# Patient Record
Sex: Male | Born: 1950 | Race: White | Hispanic: No | Marital: Married | Smoking: Never smoker
Health system: Southern US, Community
[De-identification: ages and names within clinical notes are randomized; demographics above are authoritative.]

## PROBLEM LIST (undated history)

## (undated) DIAGNOSIS — R519 Headache, unspecified: Secondary | ICD-10-CM

## (undated) DIAGNOSIS — R0989 Other specified symptoms and signs involving the circulatory and respiratory systems: Secondary | ICD-10-CM

## (undated) DIAGNOSIS — N183 Chronic kidney disease, stage 3 unspecified: Secondary | ICD-10-CM

## (undated) DIAGNOSIS — R011 Cardiac murmur, unspecified: Secondary | ICD-10-CM

## (undated) DIAGNOSIS — I10 Essential (primary) hypertension: Secondary | ICD-10-CM

## (undated) DIAGNOSIS — E7800 Pure hypercholesterolemia, unspecified: Secondary | ICD-10-CM

## (undated) DIAGNOSIS — Z87442 Personal history of urinary calculi: Secondary | ICD-10-CM

## (undated) DIAGNOSIS — T7840XA Allergy, unspecified, initial encounter: Secondary | ICD-10-CM

## (undated) DIAGNOSIS — M1990 Unspecified osteoarthritis, unspecified site: Secondary | ICD-10-CM

## (undated) DIAGNOSIS — E1122 Type 2 diabetes mellitus with diabetic chronic kidney disease: Secondary | ICD-10-CM

## (undated) DIAGNOSIS — K219 Gastro-esophageal reflux disease without esophagitis: Secondary | ICD-10-CM

## (undated) DIAGNOSIS — I350 Nonrheumatic aortic (valve) stenosis: Secondary | ICD-10-CM

## (undated) DIAGNOSIS — I480 Paroxysmal atrial fibrillation: Secondary | ICD-10-CM

## (undated) DIAGNOSIS — I2510 Atherosclerotic heart disease of native coronary artery without angina pectoris: Secondary | ICD-10-CM

## (undated) DIAGNOSIS — G4730 Sleep apnea, unspecified: Secondary | ICD-10-CM

## (undated) DIAGNOSIS — E785 Hyperlipidemia, unspecified: Secondary | ICD-10-CM

## (undated) DIAGNOSIS — I499 Cardiac arrhythmia, unspecified: Secondary | ICD-10-CM

## (undated) HISTORY — DX: Chronic kidney disease, stage 3 (moderate): N18.3

## (undated) HISTORY — DX: Chronic kidney disease, stage 3 unspecified: N18.30

## (undated) HISTORY — DX: Other specified symptoms and signs involving the circulatory and respiratory systems: R09.89

## (undated) HISTORY — DX: Type 2 diabetes mellitus with diabetic chronic kidney disease: E11.22

---

## 1997-12-11 HISTORY — PX: HERNIA REPAIR: SHX51

## 1999-12-12 HISTORY — PX: OTHER SURGICAL HISTORY: SHX169

## 2001-10-04 ENCOUNTER — Encounter: Payer: Self-pay | Admitting: Family Medicine

## 2001-10-04 ENCOUNTER — Encounter: Admission: RE | Admit: 2001-10-04 | Discharge: 2001-10-04 | Payer: Self-pay | Admitting: Family Medicine

## 2001-11-27 ENCOUNTER — Encounter: Payer: Self-pay | Admitting: Neurosurgery

## 2001-11-28 ENCOUNTER — Encounter: Payer: Self-pay | Admitting: Neurosurgery

## 2001-11-28 ENCOUNTER — Inpatient Hospital Stay (HOSPITAL_COMMUNITY): Admission: RE | Admit: 2001-11-28 | Discharge: 2001-11-29 | Payer: Self-pay | Admitting: Neurosurgery

## 2001-12-23 ENCOUNTER — Encounter: Admission: RE | Admit: 2001-12-23 | Discharge: 2001-12-23 | Payer: Self-pay | Admitting: Neurosurgery

## 2001-12-23 ENCOUNTER — Encounter: Payer: Self-pay | Admitting: Neurosurgery

## 2004-12-16 ENCOUNTER — Emergency Department: Payer: Self-pay | Admitting: Emergency Medicine

## 2006-05-24 ENCOUNTER — Inpatient Hospital Stay: Payer: Self-pay | Admitting: Internal Medicine

## 2006-05-24 IMAGING — CR DG ABDOMEN 2V
1 series · 4 of 4 positions shown · non-contrast
Comparison: none

REASON FOR EXAM: abdominal pain
COMMENTS:

[Series 1: view not recorded · 0.17mm/px · 4 of 4 slices shown]
[im 1/4]
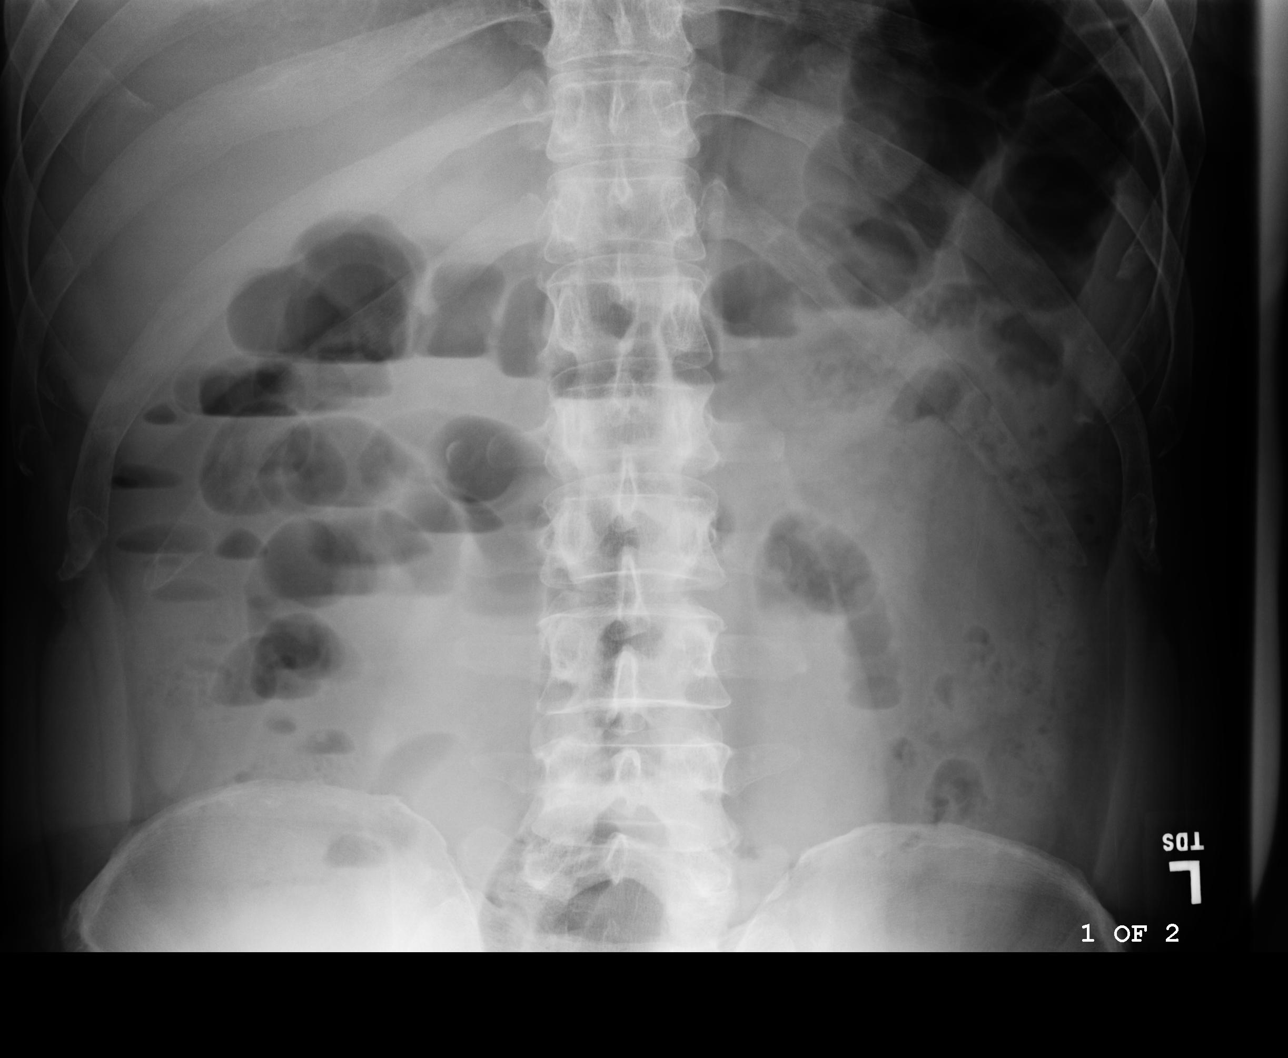
[im 2/4]
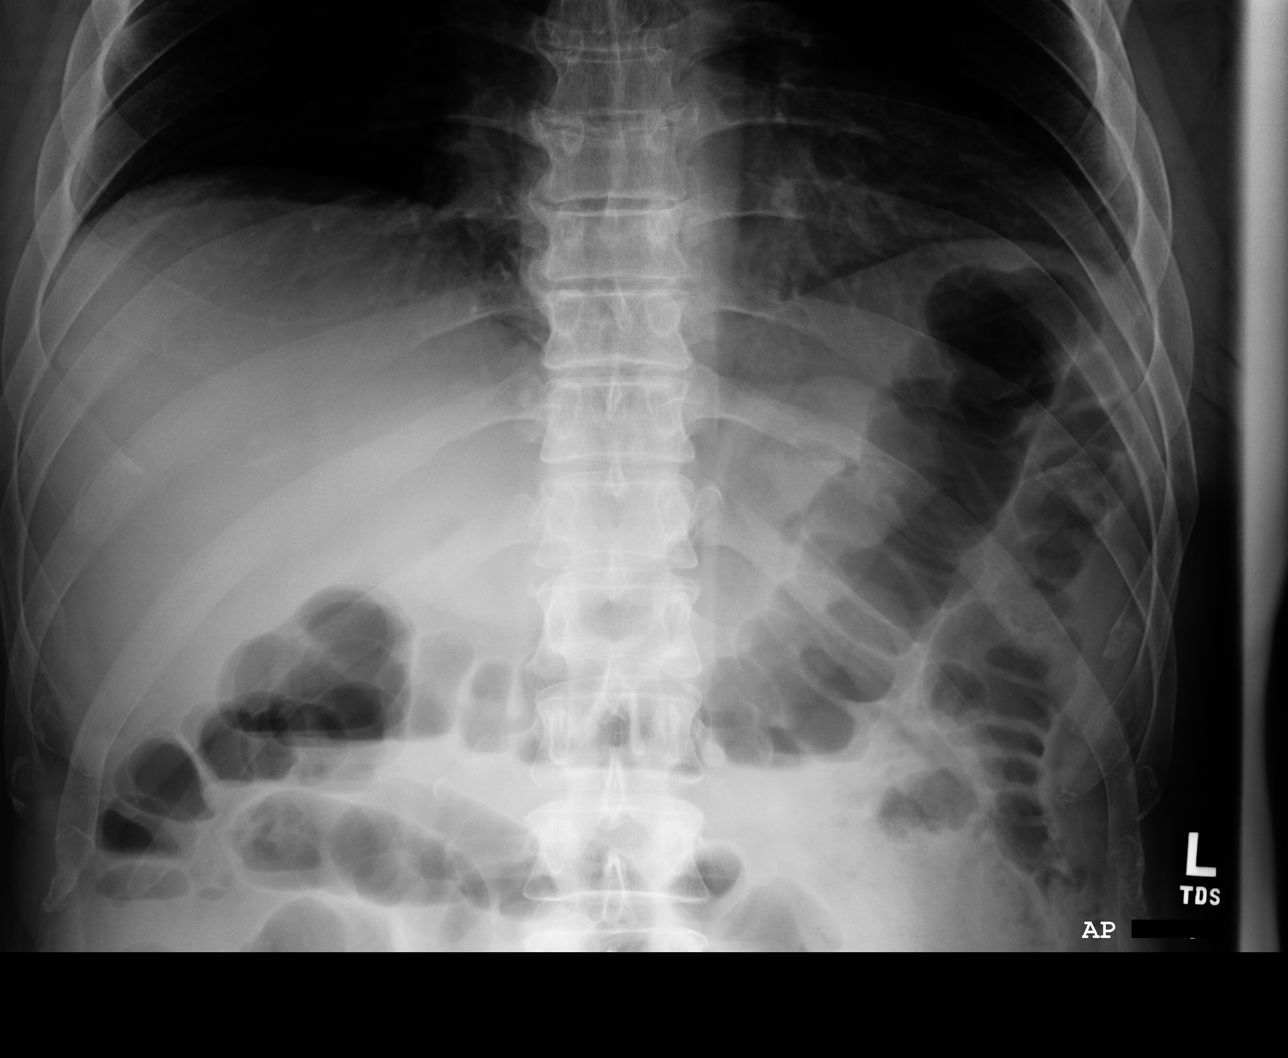
[im 3/4]
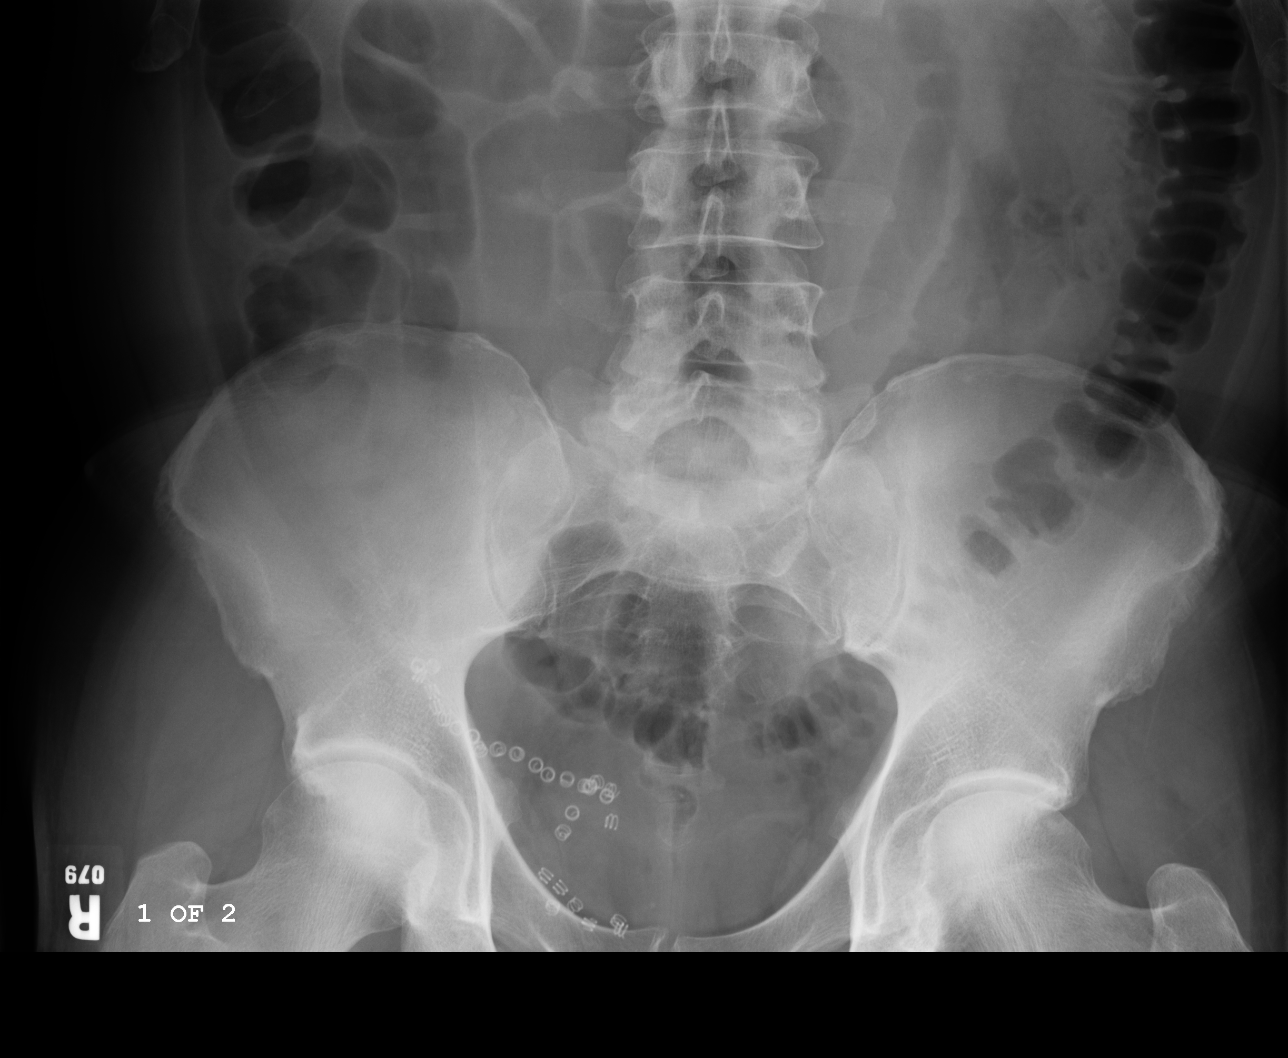
[im 4/4]
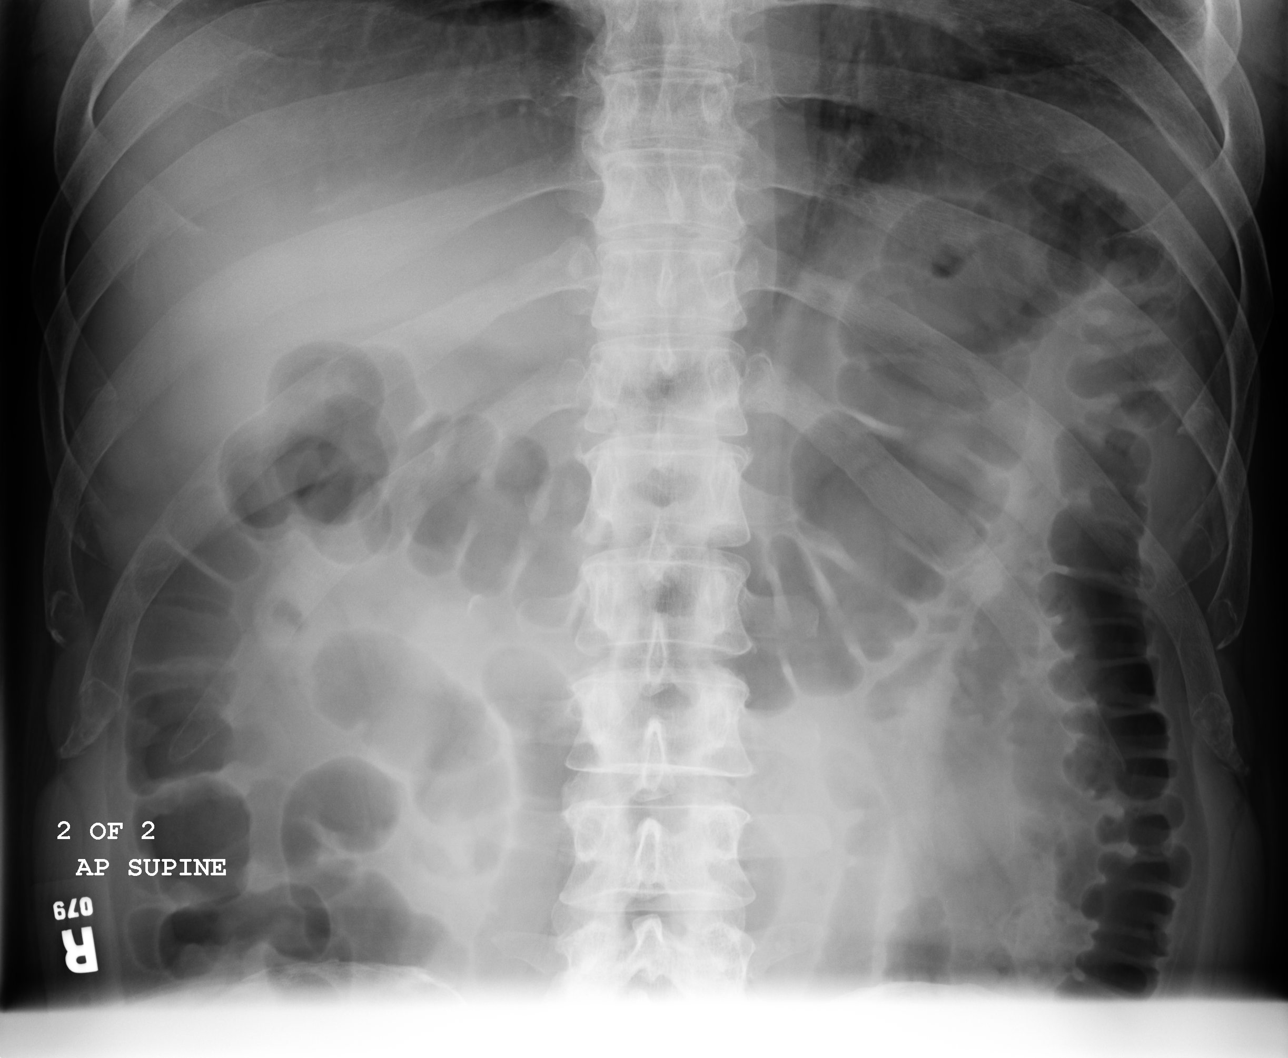

[4 of 4 positions shown; findings below may reference images not displayed]

PROCEDURE:     DXR - DXR ABDOMEN 2 V FLAT AND ERECT  - [DATE]  [DATE]

RESULT:          Flat and erect views of the abdomen were obtained.

No subdiaphragmatic free air is seen.  In the erect view, there are
scattered, nonspecific fluid levels in multiple loops of small bowel.  Such
fluid levels are nonspecific and are of the type frequently seen with
gastroenteritis, enteritis, or intraabdominal inflammation.  Gas is
visualized in the colon.  No abnormal intraabdominal calcifications are
seen.  The osseous structures are normal in appearance.
IMPRESSION: 1.     There are scattered, nonspecific fluid levels in loops of small bowel
as noted above.
2.     No bowel obstruction is seen.

## 2006-05-27 IMAGING — CT CT ABD-PELV W/ CM
1 of 3 series · 14 of 32 positions shown, 19 images · non-contrast
Comparison: none

REASON FOR EXAM: abdominal pain, diarrhea
COMMENTS:

PROCEDURE:     CT  - CT ABDOMEN / PELVIS  W  - [DATE]  [DATE]
RESULT:          Reason for consultation is abdominal pain.
TECHNIQUE: Axial images were obtained from the hemidiaphragm to the
pubic symphysis post intravenous and oral administration of contrast
material.

[Series 2: abdomen · axial · 0.77mm/px · z∈[-507,-83]mm · 14 of 62 slices shown, 19 images]
[im 4/62  soft-tissue]
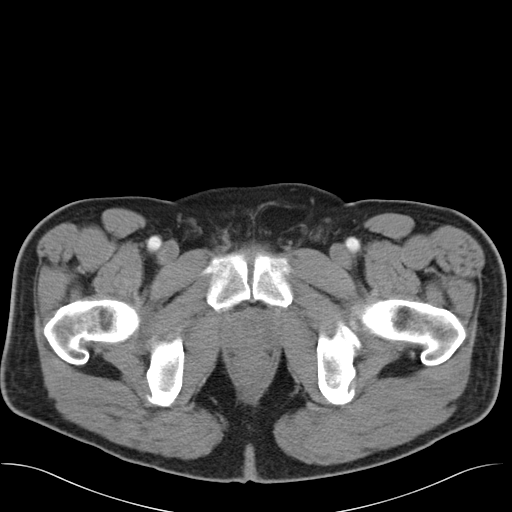
[im 4/62  bone]
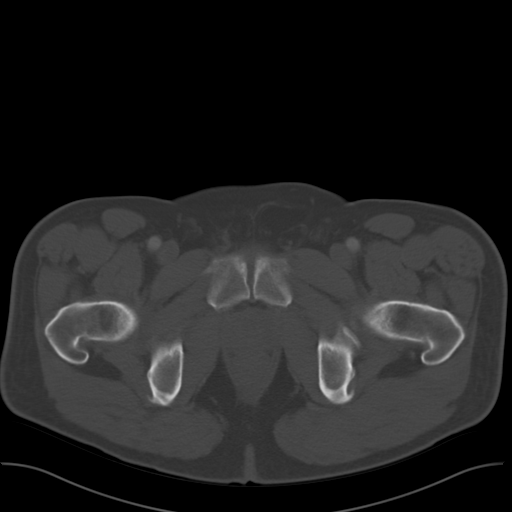
[im 10/62  soft-tissue]
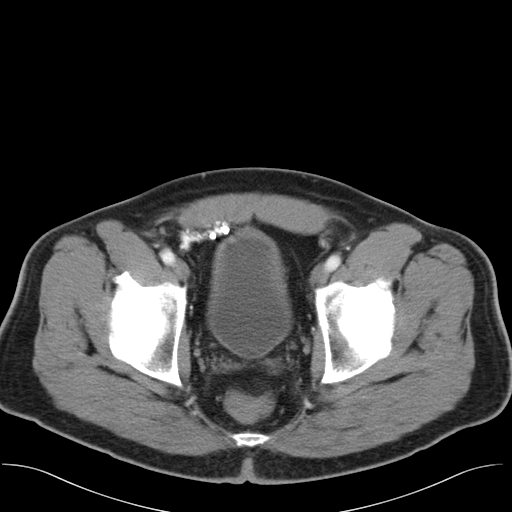
[im 13/62  soft-tissue]
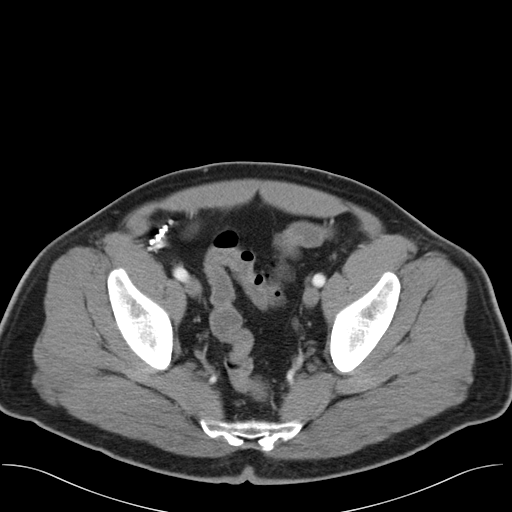
[im 17/62  soft-tissue]
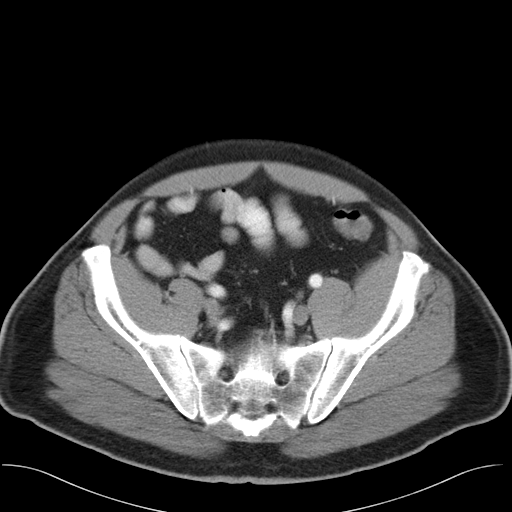
[im 23/62  soft-tissue]
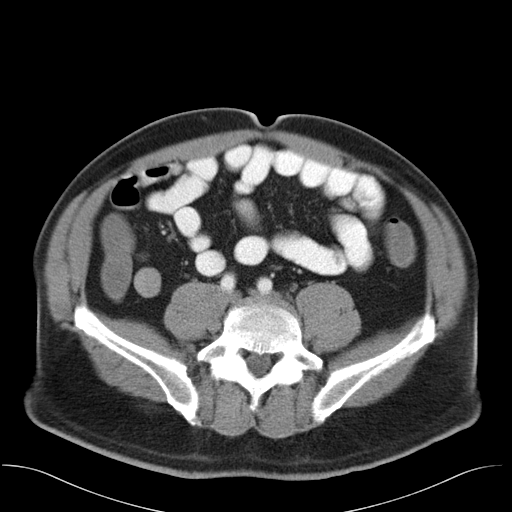
[im 26/62  soft-tissue]
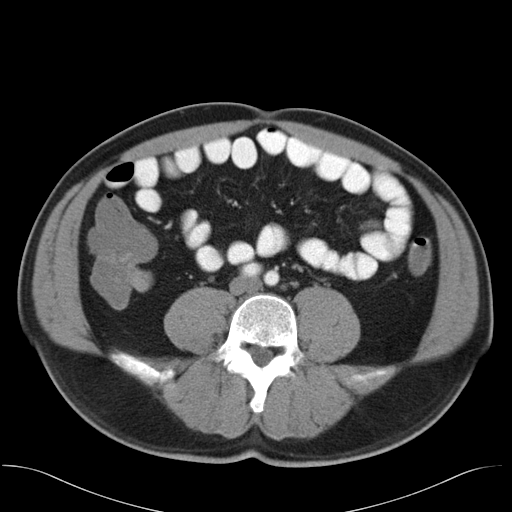
[im 33/62  soft-tissue]
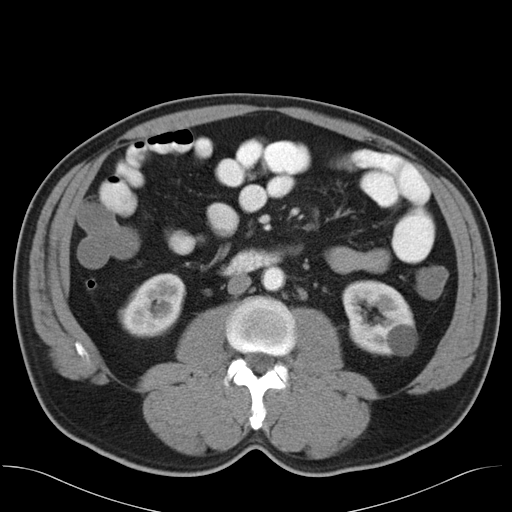
[im 36/62  soft-tissue]
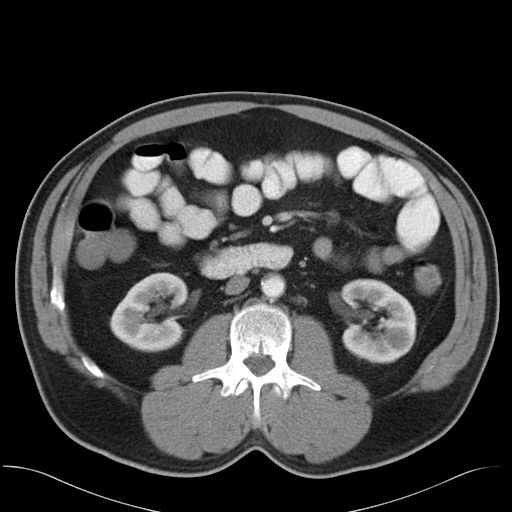
[im 39/62  soft-tissue]
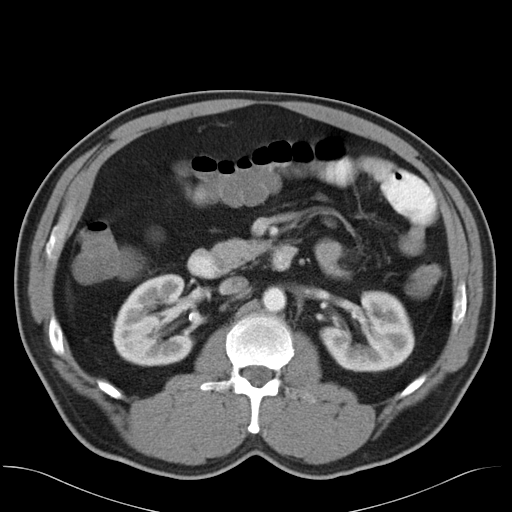
[im 39/62  bone]
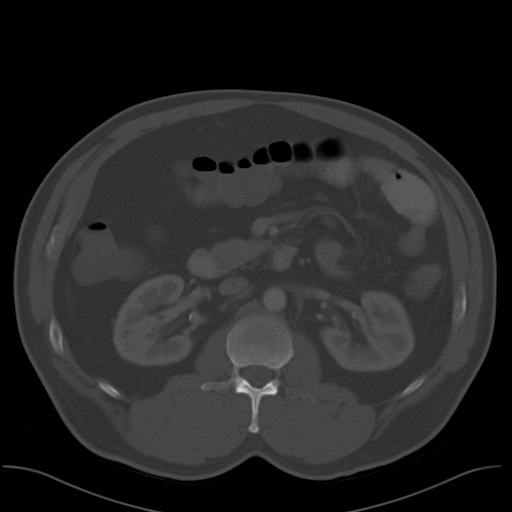
[im 45/62  soft-tissue]
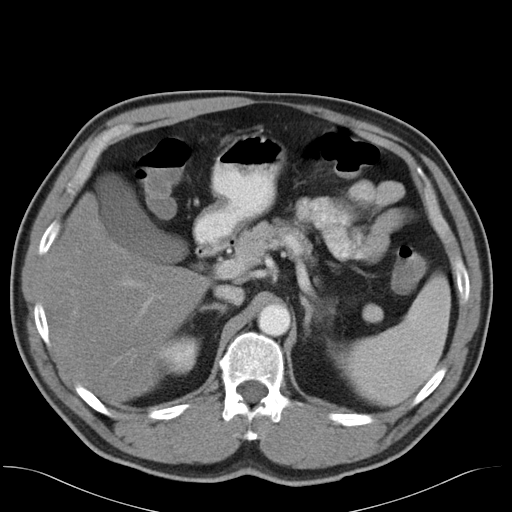
[im 49/62  soft-tissue]
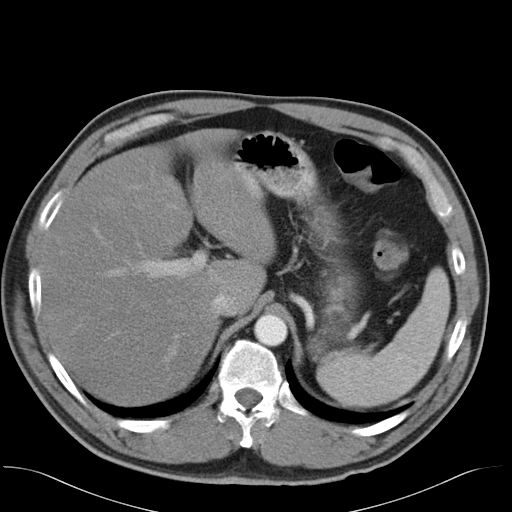
[im 49/62  lung]
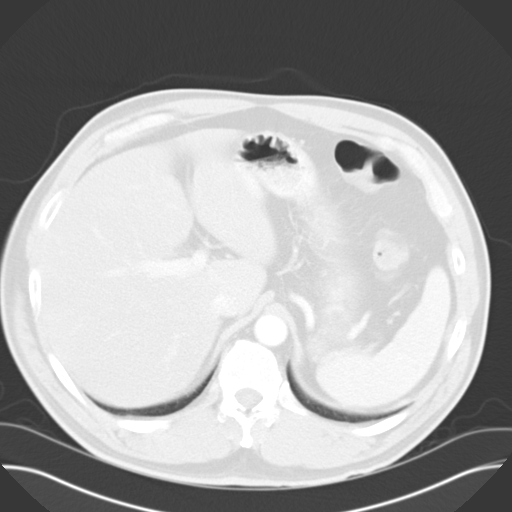
[im 52/62  soft-tissue]
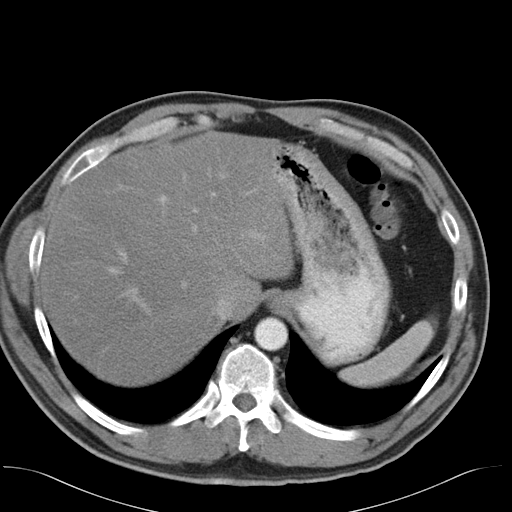
[im 52/62  lung]
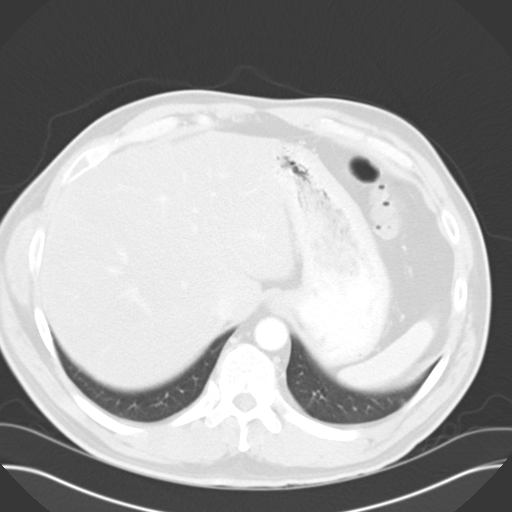
[im 55/62  lung]
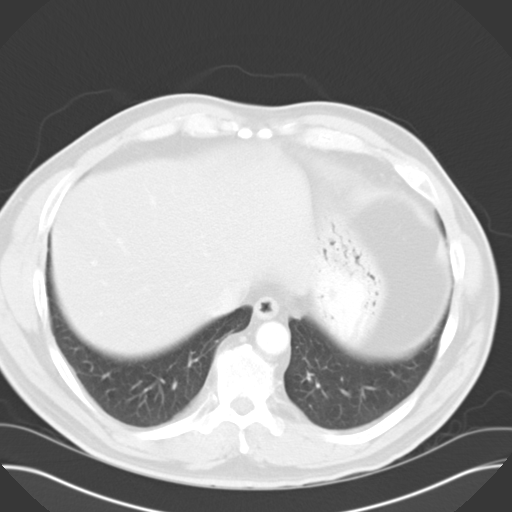
[im 58/62  soft-tissue]
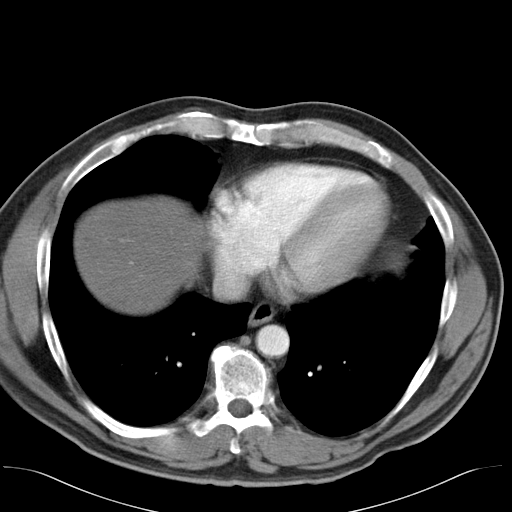
[im 58/62  lung]
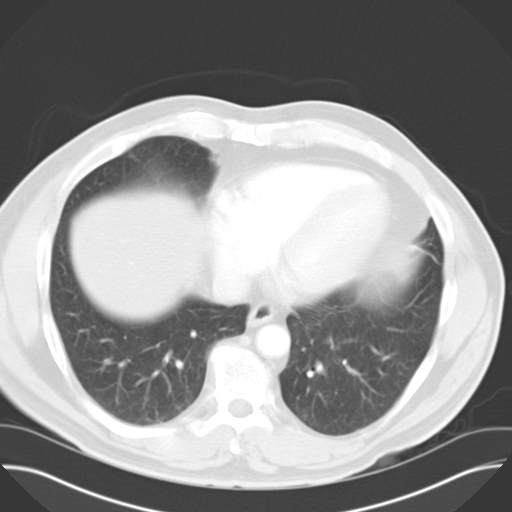

[14 of 32 positions shown; findings below may reference images not displayed]

FINDINGS: The liver and spleen appear intact.  Both kidneys
excrete the contrast material.  There is noted an approximately 2 cm cyst in
the lower pole of the LEFT kidney.  The pancreas appears intact.  No
intraabdominal or retroperitoneal masses are seen.  No ascites is present.
No pelvic adenopathy or ascites is seen.  No appendicitis is noted.  The
bowels appear intact.  No diverticulitis.

Images through the lung bases reveal the lung bases to be clear with no
effusions.
IMPRESSION: Cyst in the lower pole of the LEFT kidney.  Otherwise,
no significant abnormality is seen on the abdominopelvic CT.

## 2006-09-07 ENCOUNTER — Ambulatory Visit: Payer: Self-pay | Admitting: Gastroenterology

## 2009-07-24 ENCOUNTER — Emergency Department (HOSPITAL_COMMUNITY): Admission: EM | Admit: 2009-07-24 | Discharge: 2009-07-24 | Payer: Self-pay | Admitting: Emergency Medicine
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
## 2010-03-21 ENCOUNTER — Emergency Department (HOSPITAL_COMMUNITY): Admission: EM | Admit: 2010-03-21 | Discharge: 2010-03-21 | Payer: Self-pay | Admitting: Family Medicine

## 2010-11-10 HISTORY — PX: CORONARY ANGIOPLASTY WITH STENT PLACEMENT: SHX49

## 2010-11-20 IMAGING — CT CT CHEST W/ CM
1 series · 15 of 33 positions shown, 19 images · non-contrast
Comparison: None

REASON FOR EXAM: atrial fib with RVR, + dimer, dyspnea, tachycardia,
chest pain
COMMENTS:

PROCEDURE:     CT  - CT CHEST (FOR PE) W  - [DATE] [DATE]
RESULT:     Indications: Chest pain
TECHNIQUE: A thin-section spiral CT from the lung apices to the upper
abdomen was acquired on a multi slice scanner following 100ml [FE]
intravenous contrast. These images were then transferred to the Siemens work
station and were subsequently reviewed utilizing 3-D reconstructions and MIP
images.

[Series 4: soft tissue · axial · 0.87mm/px · z∈[-686,-410]mm · 15 of 110 slices shown, 19 images]
[im 9/110  mediastinal]
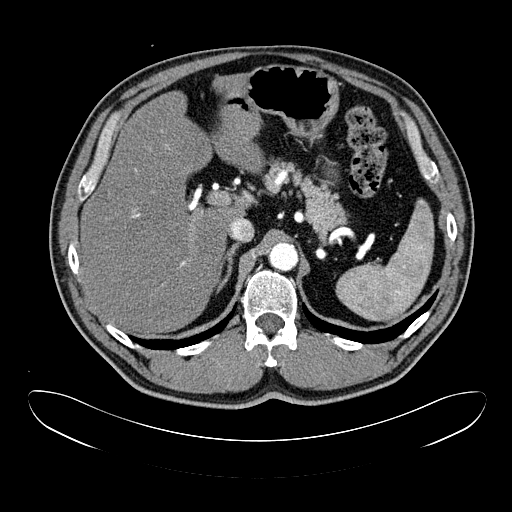
[im 9/110  lung]
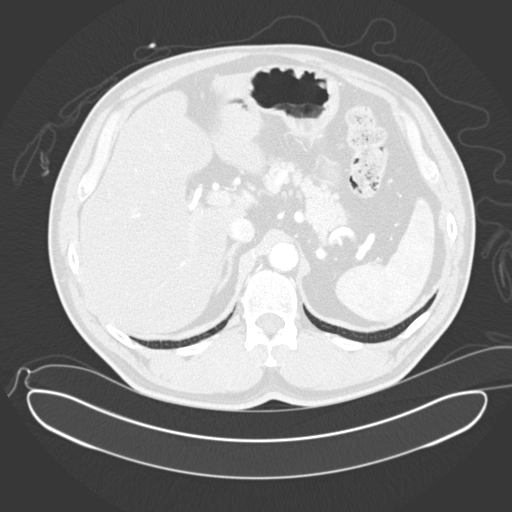
[im 17/110  lung]
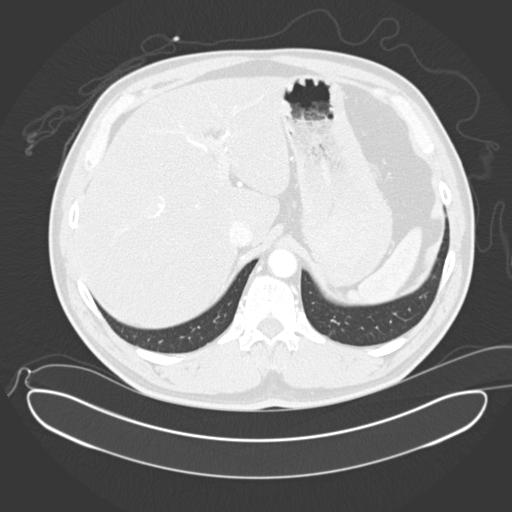
[im 22/110  lung]
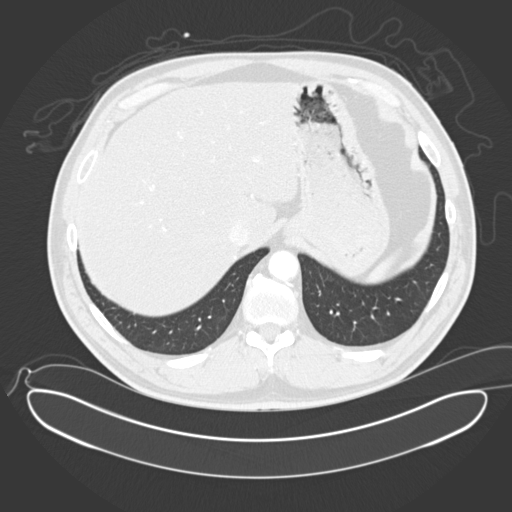
[im 29/110  lung]
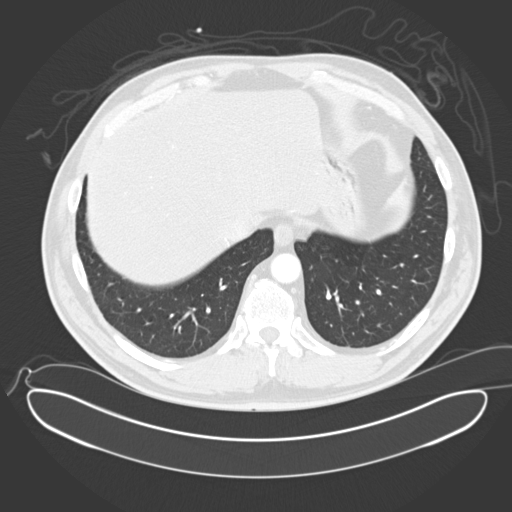
[im 37/110  mediastinal]
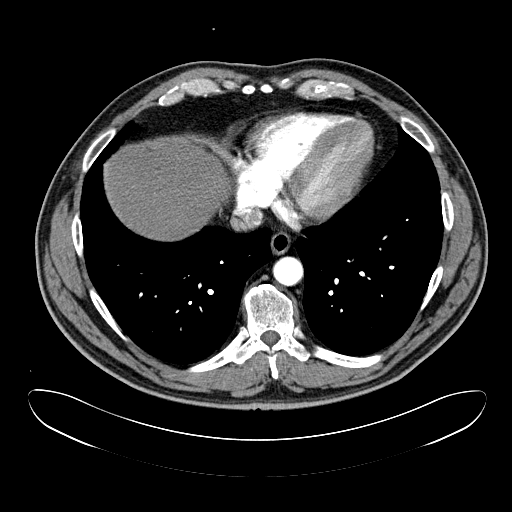
[im 37/110  lung]
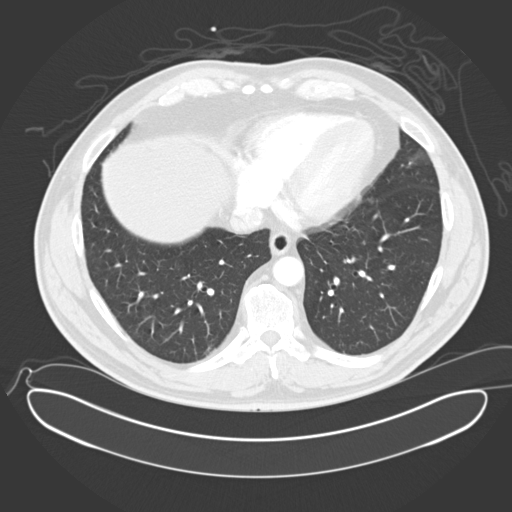
[im 44/110  lung]
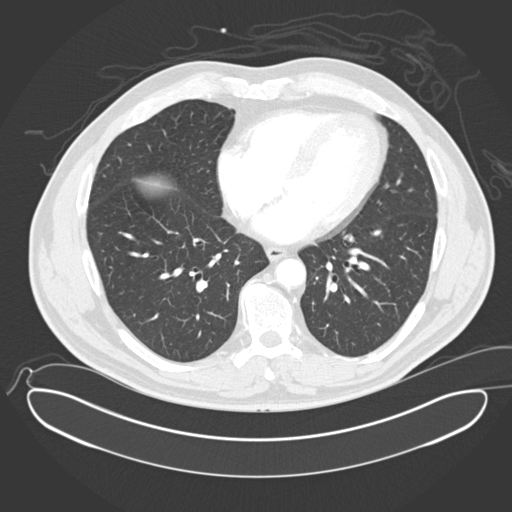
[im 49/110  lung]
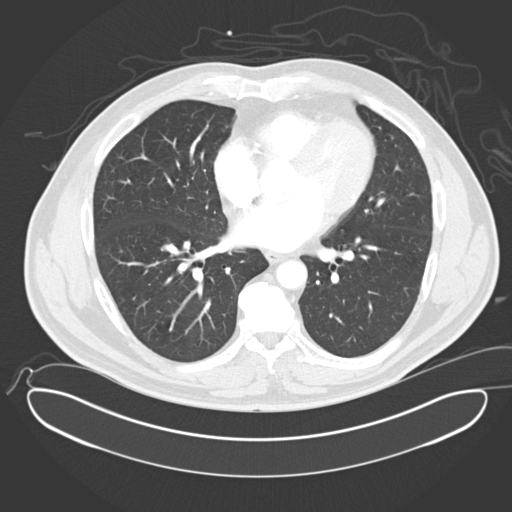
[im 57/110  lung]
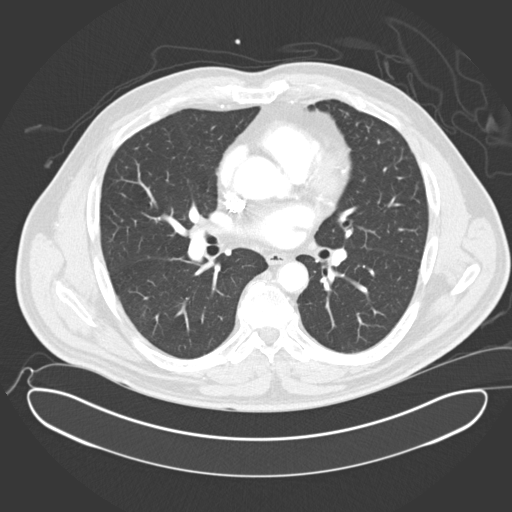
[im 61/110  mediastinal]
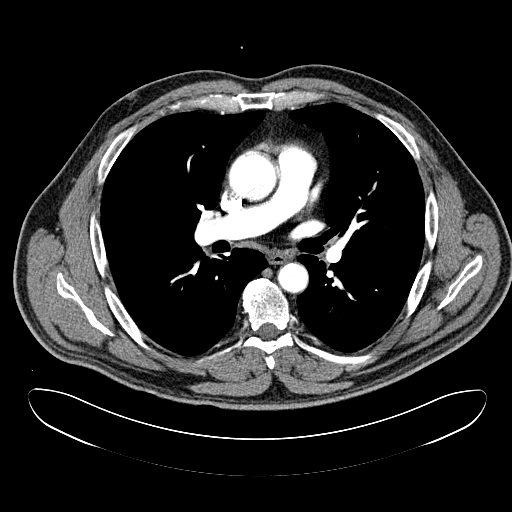
[im 61/110  lung]
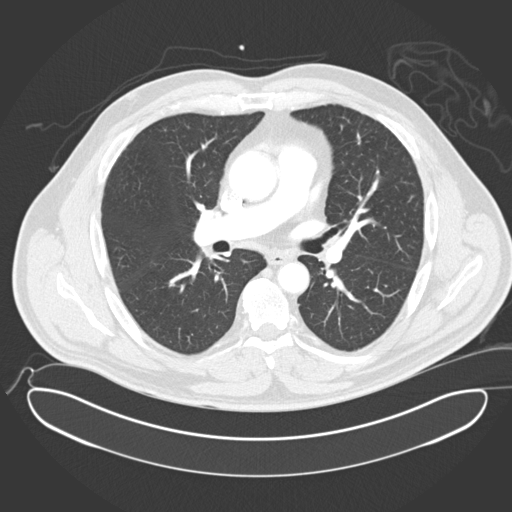
[im 66/110  lung]
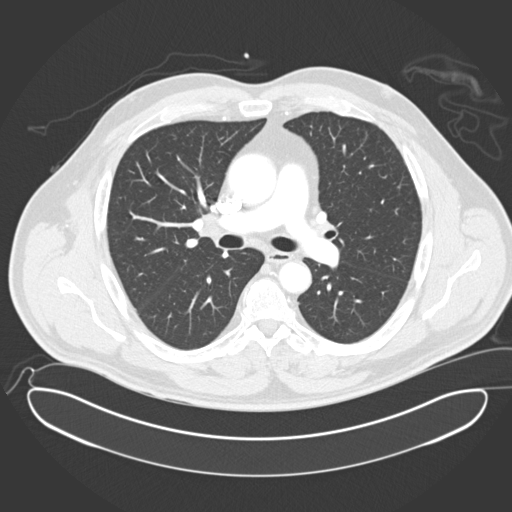
[im 73/110  lung]
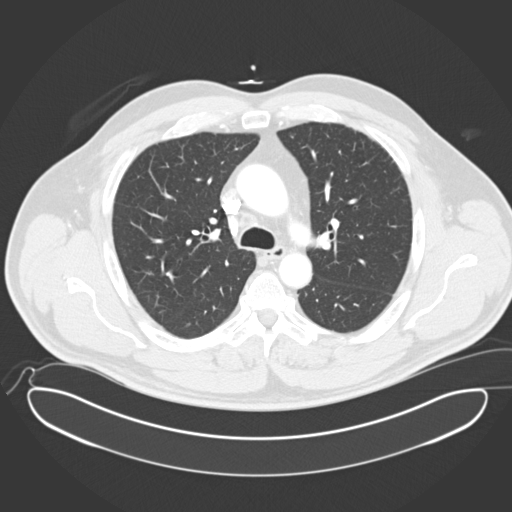
[im 81/110  lung]
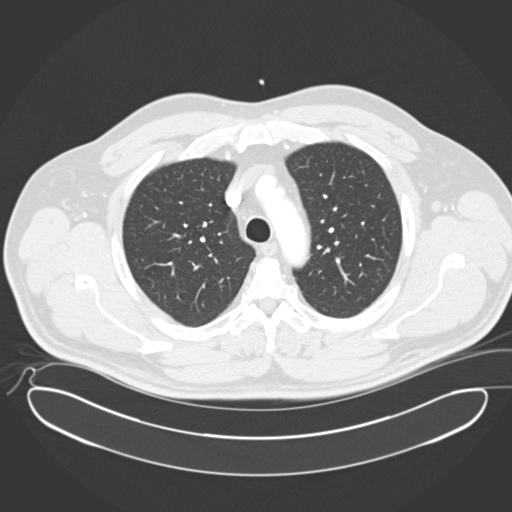
[im 88/110  mediastinal]
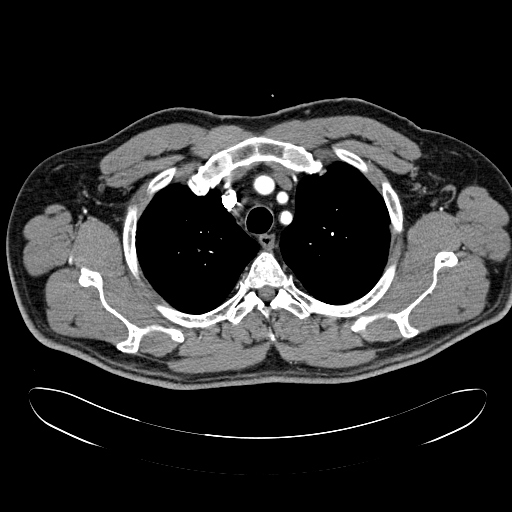
[im 88/110  lung]
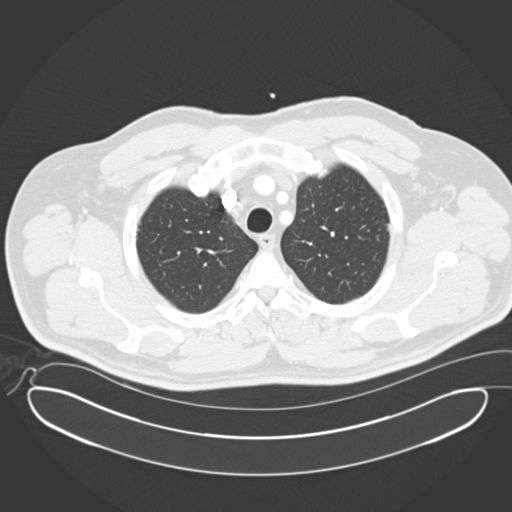
[im 93/110  lung]
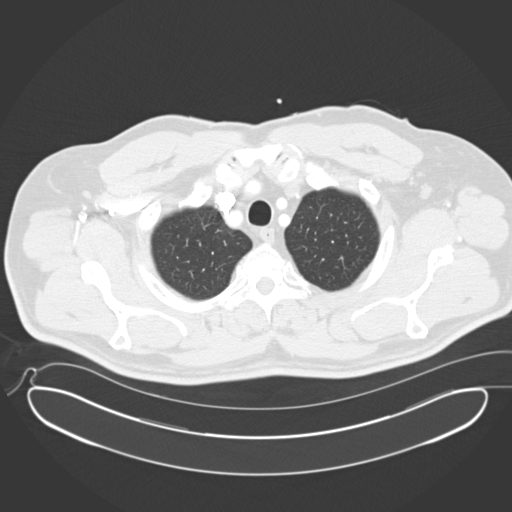
[im 101/110  lung]
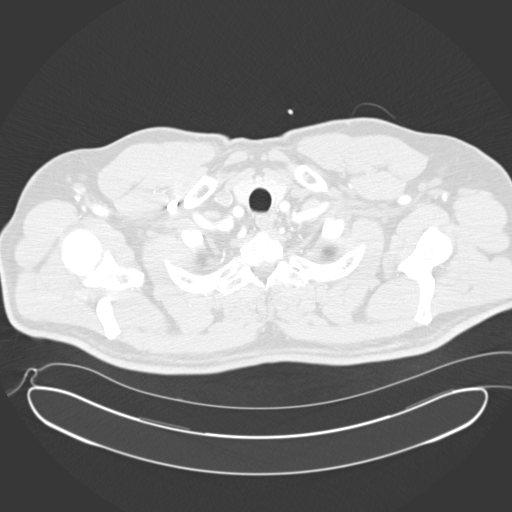

[15 of 33 positions shown; findings below may reference images not displayed]

FINDINGS: There is adequate opacification of the pulmonary arteries. There is no
pulmonary embolus. The main pulmonary artery, right main pulmonary artery,
and left main pulmonary arteries are normal in size. The heart size is
normal. There is no pericardial effusion. There is coronary artery
atherosclerosis in the LAD.

The lungs are clear. There is no focal consolidation, pleural effusion, or
pneumothorax.

There is no axillary, hilar, or mediastinal adenopathy.

The osseous structures are unremarkable.

The visualized portions of the upper abdomen are unremarkable.
IMPRESSION: 1. No CT evidence of pulmonary embolus.

2. Coronary artery disease.

## 2010-11-20 IMAGING — CR DG CHEST 1V PORT
1 series · 1 of 1 positions shown · non-contrast
Comparison: none

REASON FOR EXAM: chest pain
COMMENTS:

PROCEDURE:     DXR - DXR PORTABLE CHEST SINGLE VIEW  - [DATE]  [DATE]
RESULT:     The lung fields are clear. No pneumonia, pneumothorax or pleural
effusion is seen. The heart size is normal. Monitoring electrodes are
present.

[view not recorded]
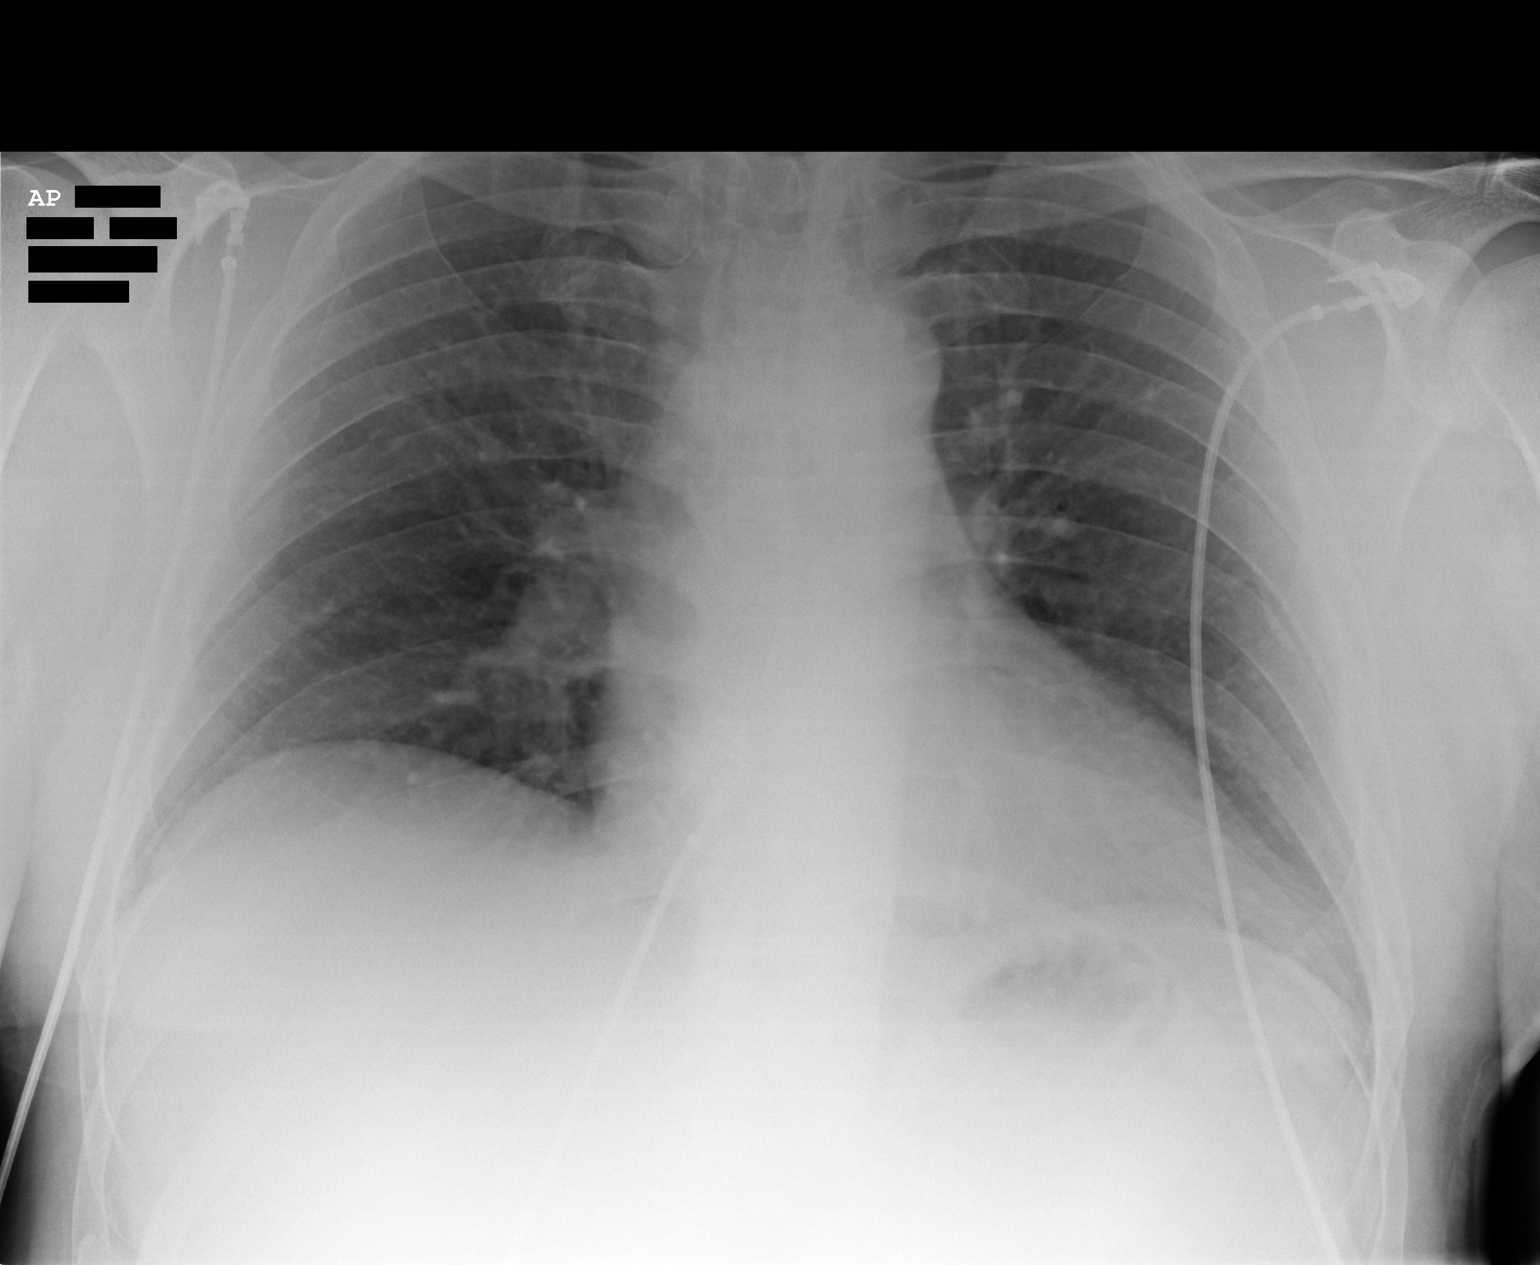

[1 of 1 positions shown; findings below may reference images not displayed]

IMPRESSION: No acute changes are identified.

## 2010-11-21 ENCOUNTER — Inpatient Hospital Stay: Payer: Self-pay | Admitting: Internal Medicine

## 2010-11-22 IMAGING — US US RENAL KIDNEY
1 series · 17 of 25 positions shown · non-contrast
Comparison: none

REASON FOR EXAM: renal failure
COMMENTS:

[Series 1: us renal kidney · 17 of 49 slices shown]
[im 1/49]
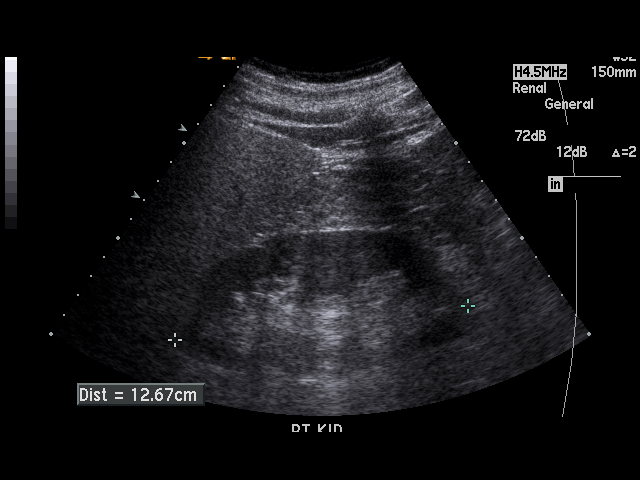
[im 5/49]
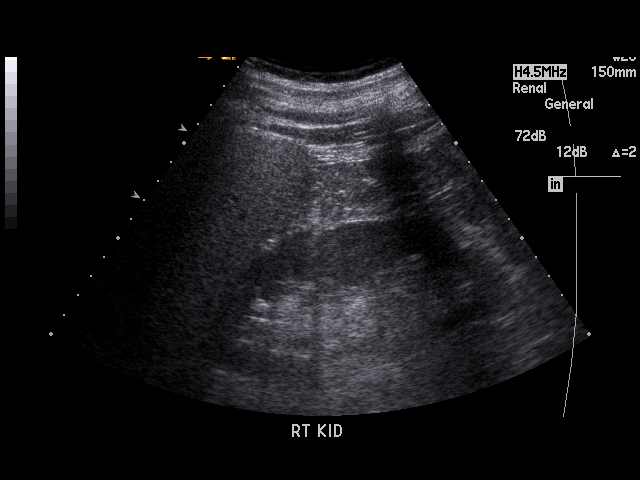
[im 7/49]
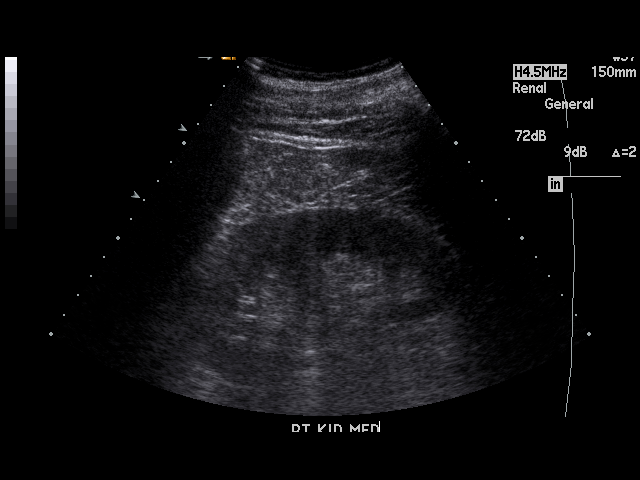
[im 11/49]
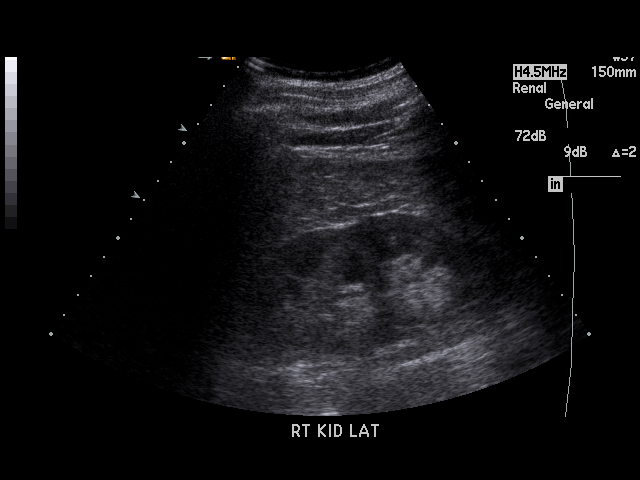
[im 13/49]
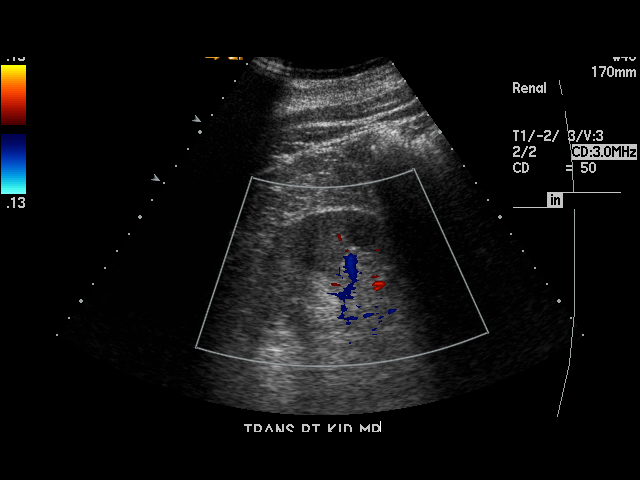
[im 17/49]
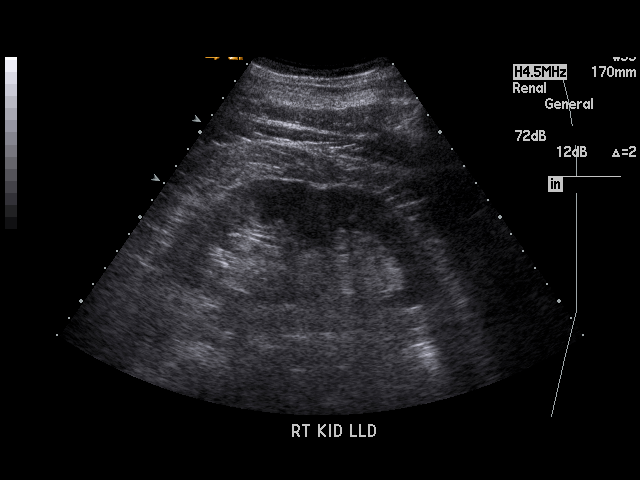
[im 19/49]
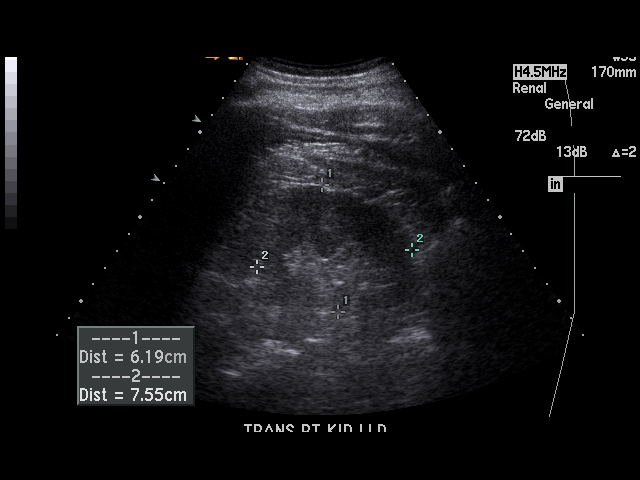
[im 23/49]
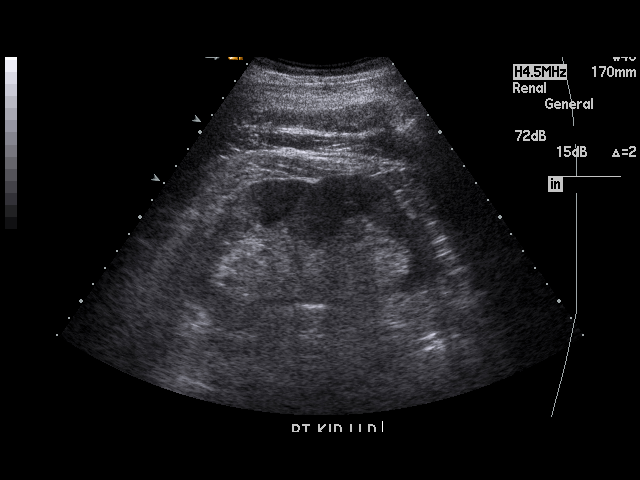
[im 25/49]
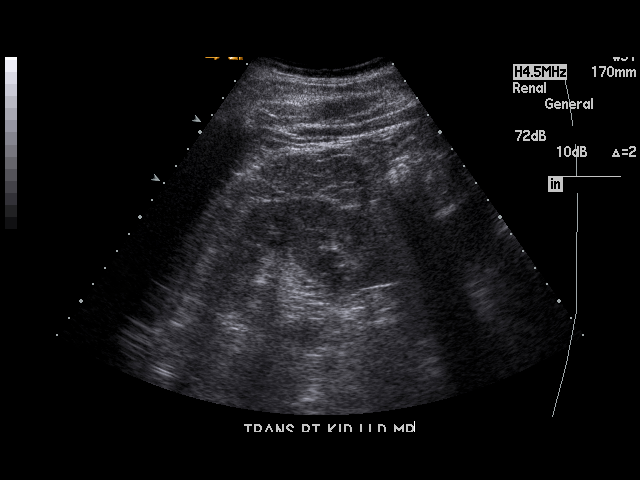
[im 27/49]
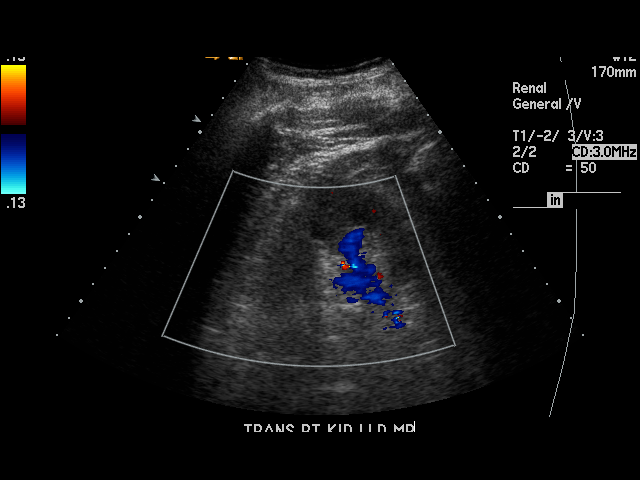
[im 31/49]
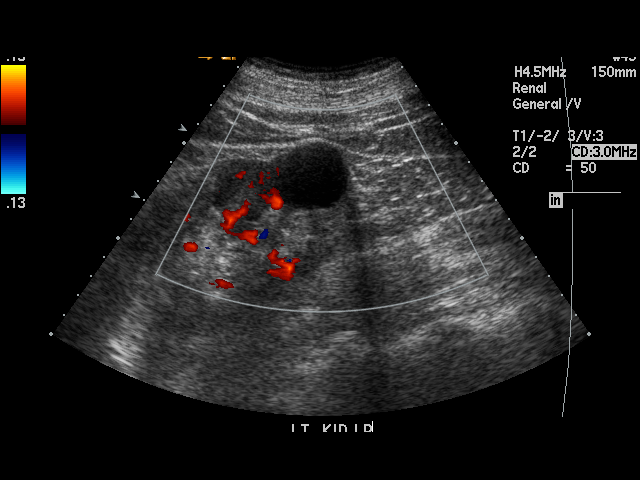
[im 33/49]
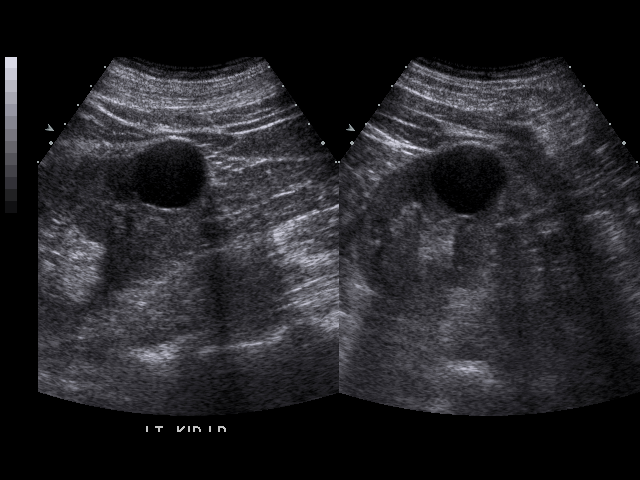
[im 37/49]
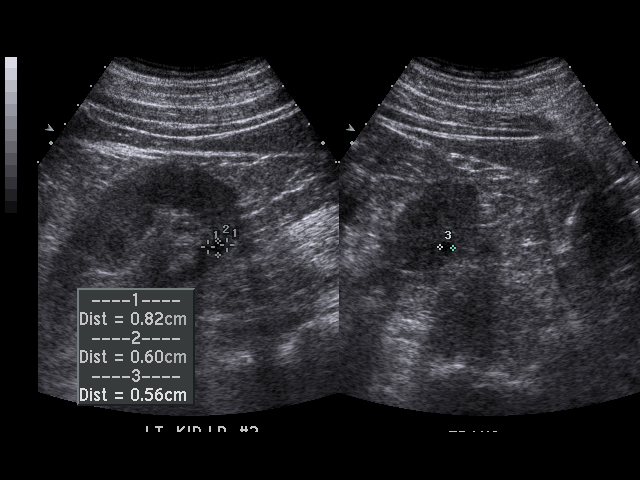
[im 39/49]
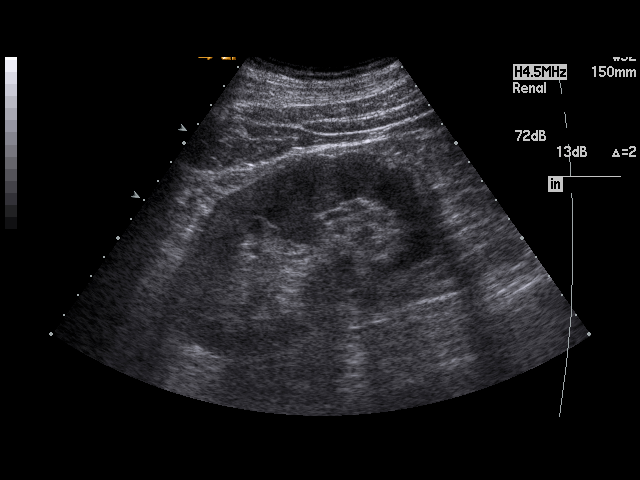
[im 43/49]
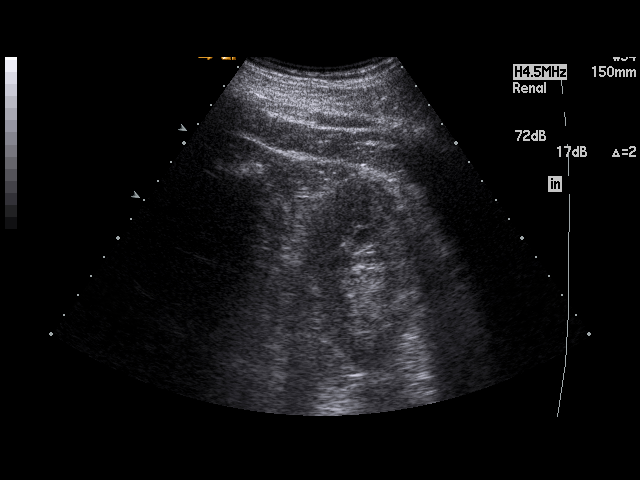
[im 45/49]
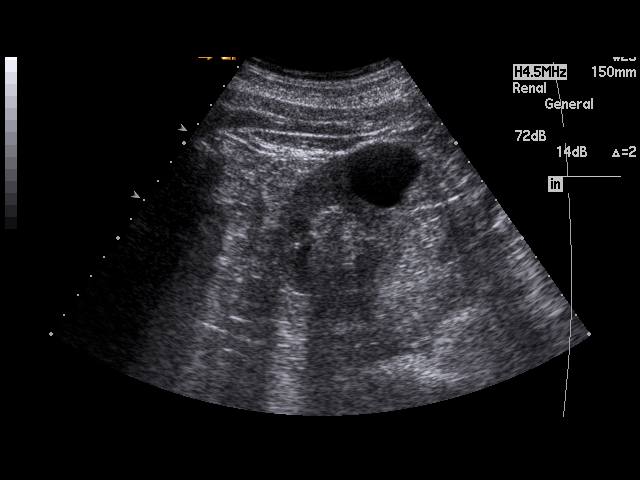
[im 49/49]
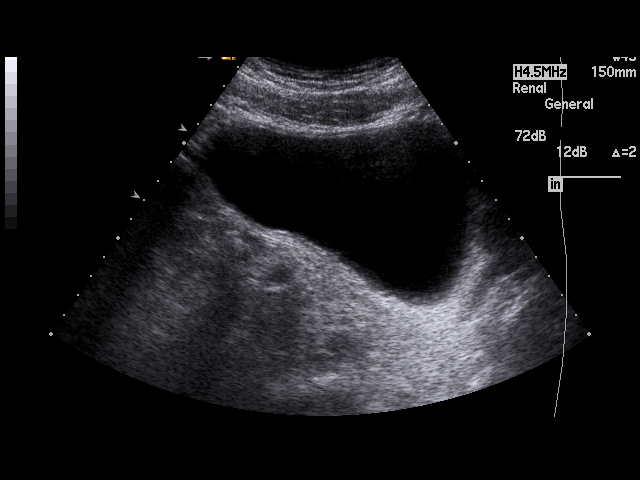

[17 of 25 positions shown; findings below may reference images not displayed]

PROCEDURE:     US  - US KIDNEY  - [DATE] [DATE]

RESULT:     The right kidney measures 14.02 cm x 6.19 cm x 7.55 cm and the
left kidney measures 12.88 cm x 6.02 cm x 6.45 cm. No solid renal mass
lesions are seen. No renal calcifications are identified. The renal cortical
margins are smooth bilaterally. There are observed two cysts of the left
kidney. The larger measures 3.35 cm at maximum diameter and the smaller
measures 8.2 mm at maximum diameter. No hydronephrosis is seen. The urinary
bladder is normal in appearance.
IMPRESSION: 1. No acute changes are identified.
2. There are observed two left renal cysts.

## 2010-11-22 IMAGING — US US EXTREM LOW VENOUS BILAT
1 series · 17 of 24 positions shown · non-contrast
Comparison: none

REASON FOR EXAM: abnormal d-dimer
COMMENTS:

[Series 1: us extrem low venous bilat · 17 of 44 slices shown]
[im 1/44]
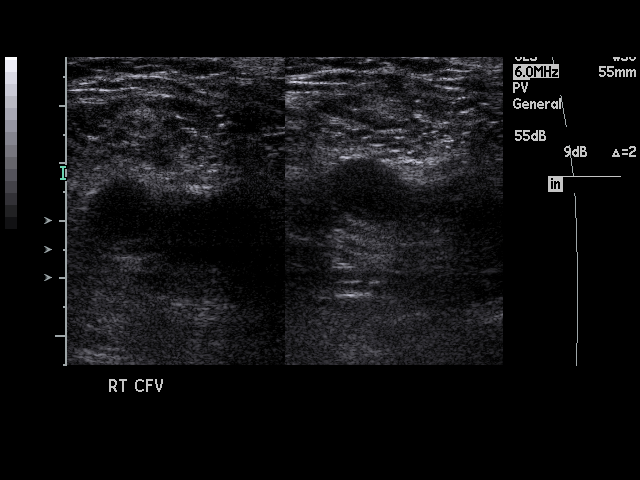
[im 4/44]
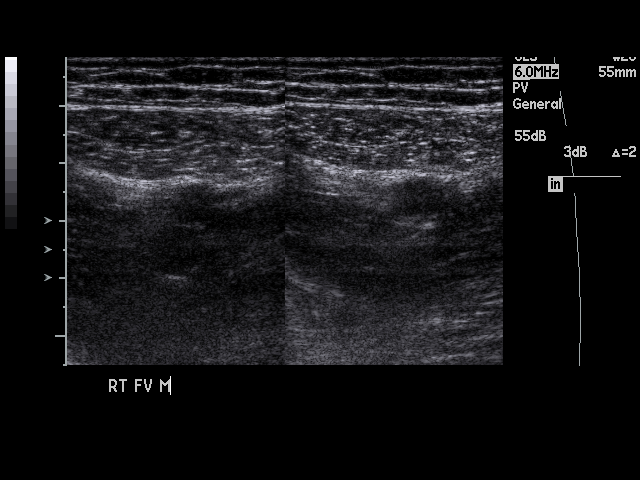
[im 6/44]
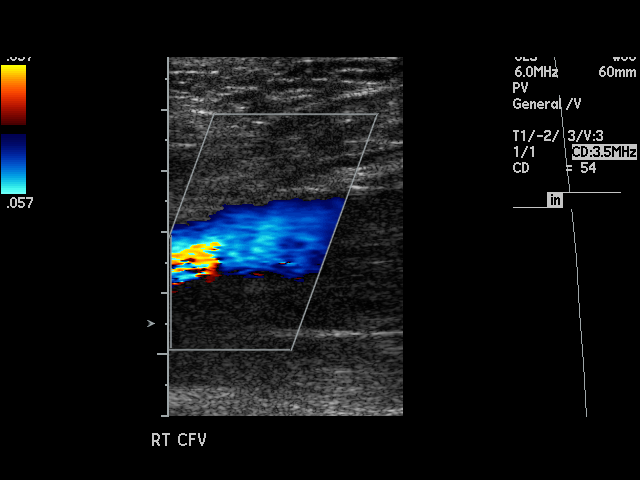
[im 8/44]
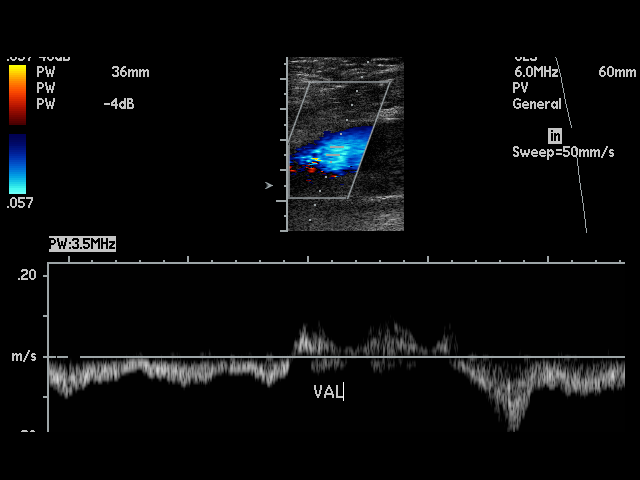
[im 12/44]
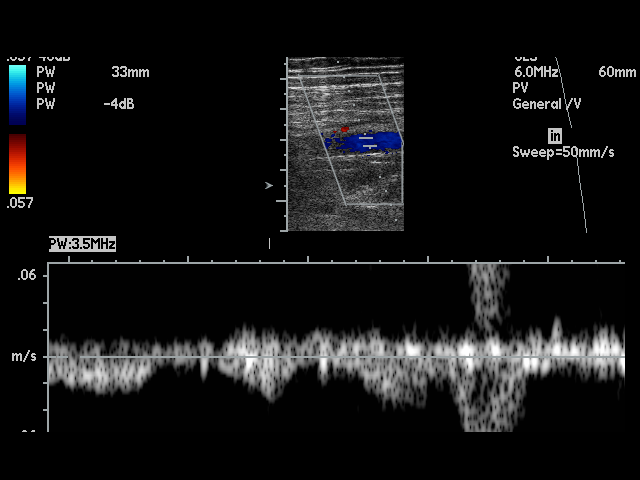
[im 14/44]
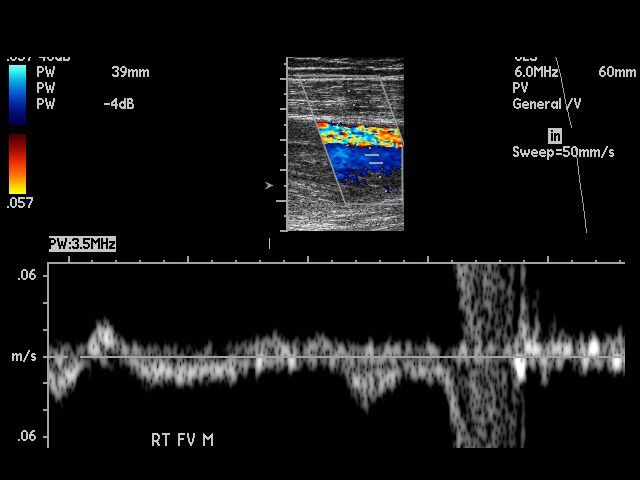
[im 17/44]
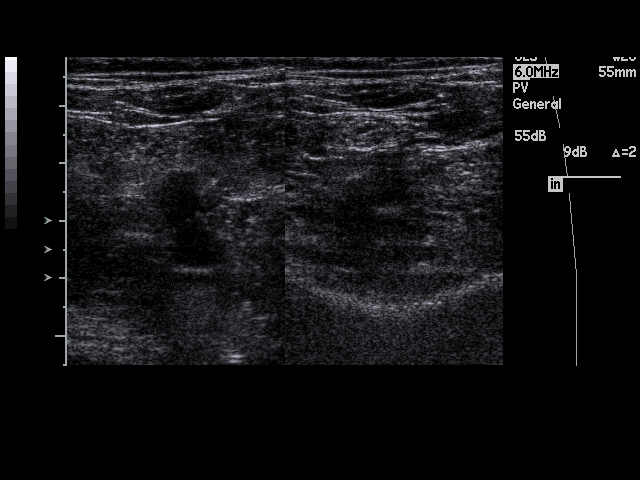
[im 19/44]
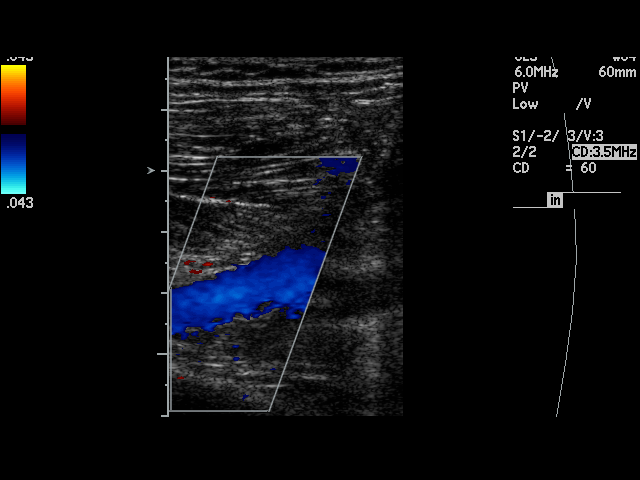
[im 23/44]
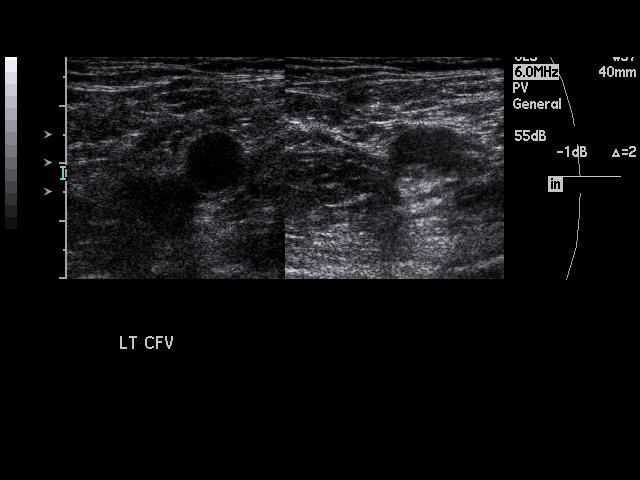
[im 25/44]
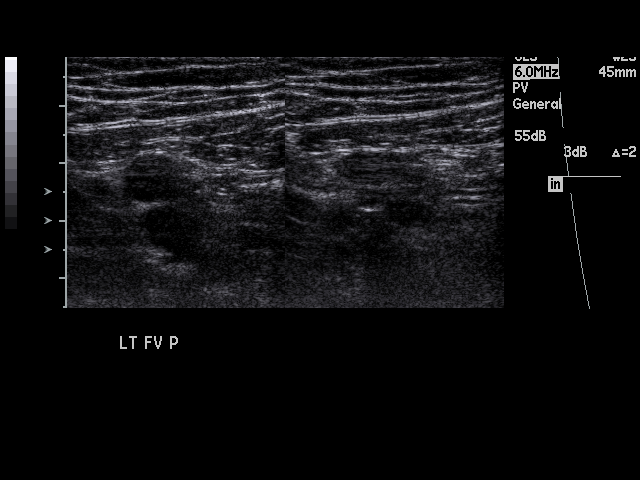
[im 27/44]
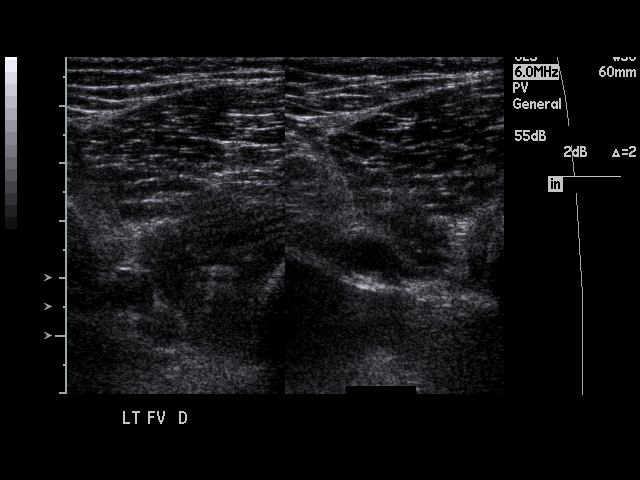
[im 30/44]
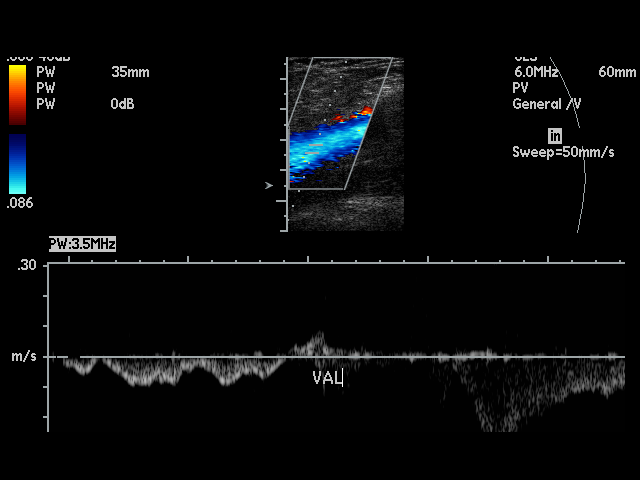
[im 32/44]
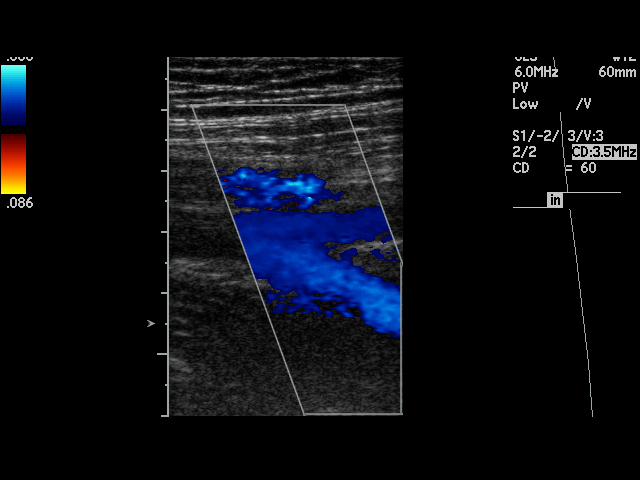
[im 36/44]
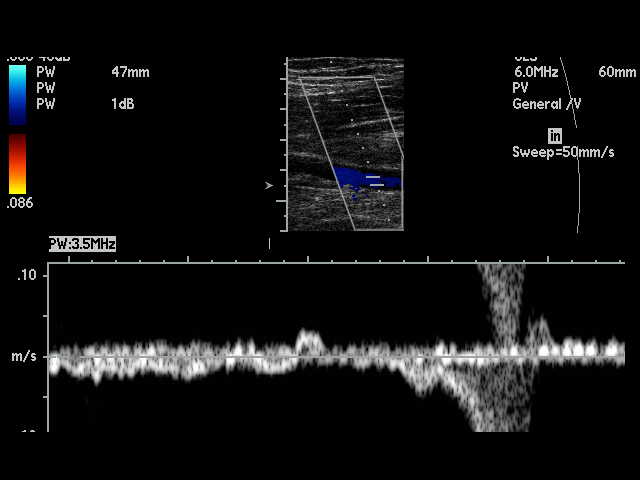
[im 38/44]
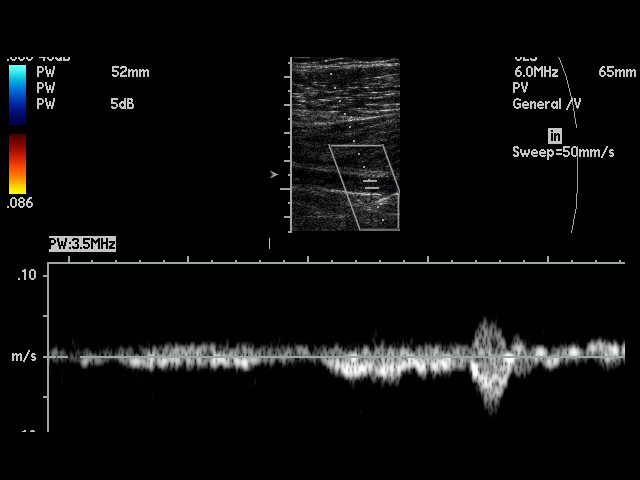
[im 40/44]
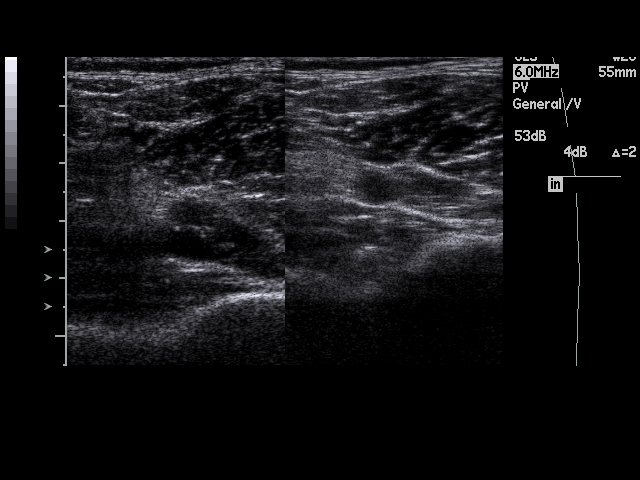
[im 44/44]
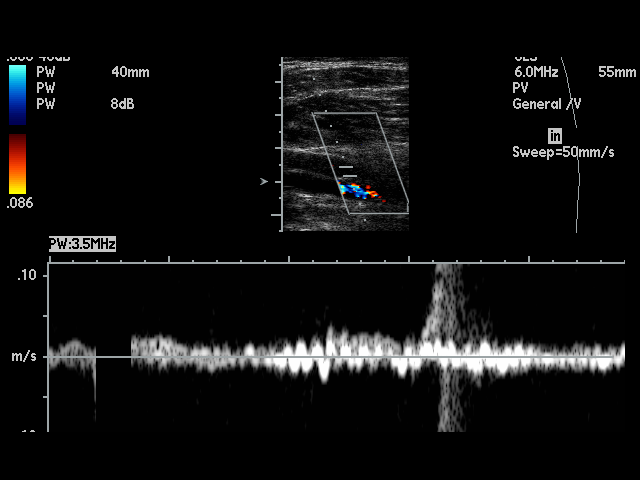

[17 of 24 positions shown; findings below may reference images not displayed]

PROCEDURE:     US  - US DOPPLER LOW EXTR BILATERAL  - [DATE] [DATE]

RESULT:     The phasic, augmentation and Valsalva flow waveforms are normal
in appearance bilaterally. The femoral and popliteal veins bilaterally show
complete compressibility throughout their course. Bilateral Doppler
examination shows no occlusion or evidence for deep vein thrombosis.
IMPRESSION: No deep vein thrombosis is identified on either side.

## 2011-01-02 ENCOUNTER — Encounter: Payer: Self-pay | Admitting: Cardiovascular Disease

## 2011-01-11 ENCOUNTER — Encounter: Payer: Self-pay | Admitting: Cardiovascular Disease

## 2011-01-25 ENCOUNTER — Inpatient Hospital Stay: Payer: Self-pay | Admitting: Internal Medicine

## 2011-01-25 IMAGING — CR DG CHEST 1V PORT
1 series · 1 of 1 positions shown · non-contrast
Comparison: none

REASON FOR EXAM: cp
COMMENTS:

[view not recorded]
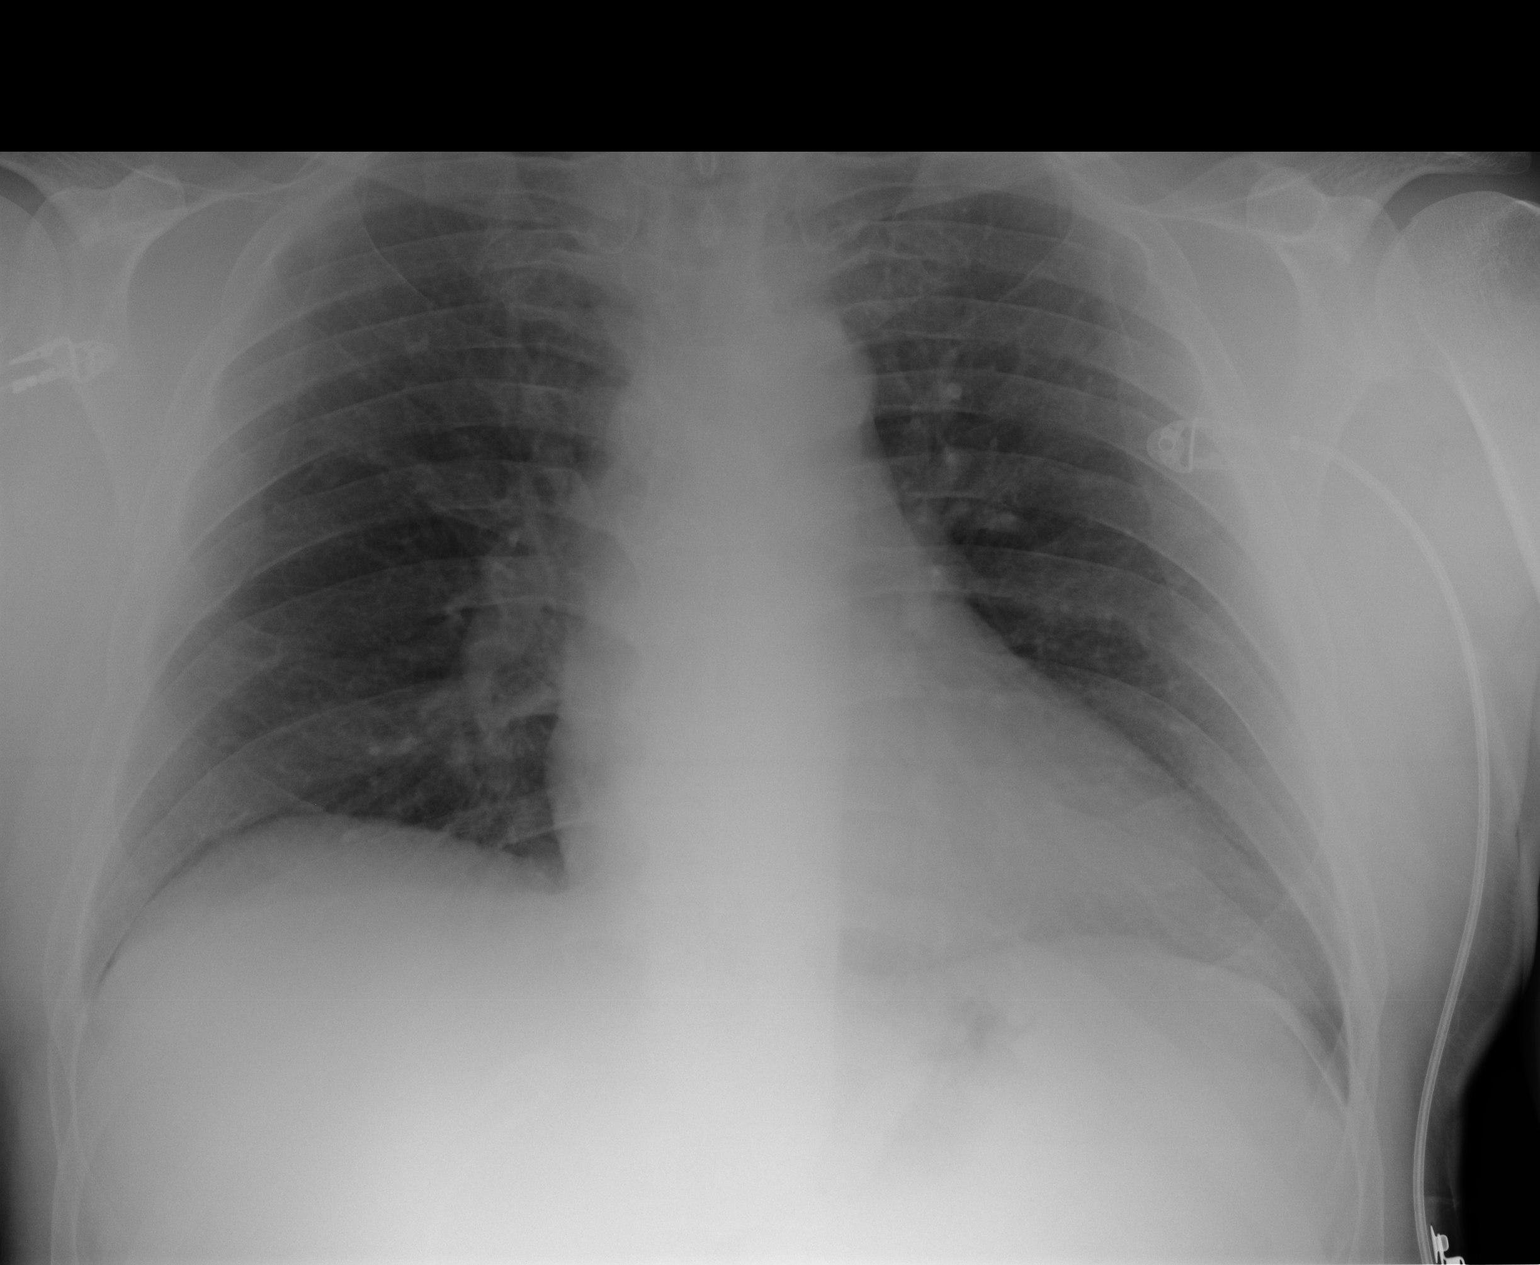

[1 of 1 positions shown; findings below may reference images not displayed]

PROCEDURE:     DXR - DXR PORTABLE CHEST SINGLE VIEW  - [DATE]  [DATE]

RESULT:     Comparison is made to the study [DATE].

The lungs are adequately inflated. There is no focal infiltrate. The cardiac
silhouette is top normal in size. The pulmonary vascularity is mildly
prominent centrally but this is stable.
IMPRESSION: The findings suggest low-grade CHF. A followup PA and
lateral chest x-ray would be of value.

## 2011-02-09 ENCOUNTER — Ambulatory Visit (HOSPITAL_COMMUNITY)
Admission: RE | Admit: 2011-02-09 | Discharge: 2011-02-09 | Disposition: A | Discharge status: Home / Self Care | Payer: 59 | Source: Ambulatory Visit | Attending: Cardiovascular Disease | Admitting: Cardiovascular Disease

## 2011-02-09 ENCOUNTER — Encounter: Payer: Self-pay | Admitting: Cardiovascular Disease

## 2011-02-09 DIAGNOSIS — E785 Hyperlipidemia, unspecified: Secondary | ICD-10-CM | POA: Insufficient documentation

## 2011-02-09 DIAGNOSIS — G4733 Obstructive sleep apnea (adult) (pediatric): Secondary | ICD-10-CM | POA: Insufficient documentation

## 2011-02-09 DIAGNOSIS — I10 Essential (primary) hypertension: Secondary | ICD-10-CM | POA: Insufficient documentation

## 2011-02-09 DIAGNOSIS — I2510 Atherosclerotic heart disease of native coronary artery without angina pectoris: Secondary | ICD-10-CM | POA: Insufficient documentation

## 2011-02-09 DIAGNOSIS — R079 Chest pain, unspecified: Secondary | ICD-10-CM | POA: Insufficient documentation

## 2011-02-09 DIAGNOSIS — Z9861 Coronary angioplasty status: Secondary | ICD-10-CM | POA: Insufficient documentation

## 2011-02-09 DIAGNOSIS — Z01812 Encounter for preprocedural laboratory examination: Secondary | ICD-10-CM | POA: Insufficient documentation

## 2011-02-09 LAB — BASIC METABOLIC PANEL
BUN: 16 mg/dL (ref 6–23)
Calcium: 10.2 mg/dL (ref 8.4–10.5)
Creatinine, Ser: 1.38 mg/dL (ref 0.4–1.5)
GFR calc non Af Amer: 52 mL/min — ABNORMAL LOW (ref >60)
Glucose, Bld: 105 mg/dL — ABNORMAL HIGH (ref 70–99)

## 2011-02-09 LAB — PROTIME-INR
INR: 0.89 (ref 0.00–1.49)
Prothrombin Time: 12.2 seconds (ref 11.6–15.2)

## 2011-02-09 LAB — CBC
HCT: 40.2 % (ref 39.0–52.0)
Hemoglobin: 13.9 g/dL (ref 13.0–17.0)
MCH: 30 pg (ref 26.0–34.0)
MCHC: 34.6 g/dL (ref 30.0–36.0)
MCV: 86.8 fL (ref 78.0–100.0)
Platelets: 165 10*3/uL (ref 150–400)
RBC: 4.63 MIL/uL (ref 4.22–5.81)
RDW: 13.2 % (ref 11.5–15.5)
WBC: 5.7 10*3/uL (ref 4.0–10.5)

## 2011-02-22 NOTE — Procedures (Signed)
NAMEELIU, BATCH NO.:  [id number]  MEDICAL RECORD NO.:  [id number]           PATIENT TYPE:  O  LOCATION:  [facility]                         FACILITY:  [facility]  PHYSICIAN:  Nanetta Batty, M.D.   DATE OF BIRTH:  Dec 11, 1951  DATE OF PROCEDURE: DATE OF DISCHARGE:                           CARDIAC CATHETERIZATION   Jason Neal is a 60 year old married, Caucasian male, father of 2, who I saw in the office recently on January 31, 2011.  I have been taking care of him for 20 years.  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  Other problems include hypertension, hyperlipidemia, obstructive sleep apnea, on CPAP followed by Dr. Caryl Asp. He has had PAF in the past.  He apparently developed chest pain, was brought to the [facility] with Afib with RPR, underwent cath by Dr. Welton Flakes, and ultimately, intervention by Dr. Juliann Pares with stenting of obtuse marginal branch.  Apparently, the procedure was complicated by dissection requiring intra-aortic balloon, pulsation and transferred to [facility] where he stayed 2-3 days and was discharged home.  He has had recurrent symptoms of chest pain and presents now for an elective outpatient diagnostic coronary arteriography to define his anatomy and rule out ischemic etiology.  DESCRIPTION OF PROCEDURE:  The patient was brought to the second floor [facility] in the postabsorptive state.  He was premedicated with p.o. Valium, IV versed and fentanyl.  His right groin was prepped and shaved in usual sterile fashion.  1% Xylocaine was used for local anesthesia.  A 5-French sheath was inserted into the right femoral artery using standard Seldinger technique.  A 5-French left Judkins catheter, AL-1 catheter and pigtail catheter were used for selective coronary  angiography and left ventriculography respectively. Visipaque dye was used for the entirety of the case.  Retrograde aortic, left ventricular blood pressures were recorded.  HEMODYNAMIC RESULTS: 1. Aortic systolic pressure 130, diastolic pressure 70. 2. Left ventricle systolic pressure 130, end-diastolic pressure 19.  SELECTIVE CORONARY ANGIOGRAPHY: 1. Left main normal. 2. LAD; LAD had most 50% stenosis in the proximal portion of the first     diagonal branch. 3. Circumflex; patent stent in the first obtuse marginal branch.     There was a 50-60% tubular stenosis just proximal to the stented     segment.  Right coronary; this was dominant and arose anomalously with an anterior takeoff.  It was cannulated with an AL-1 and was widely patent.  Left ventriculography; RAO left ventriculogram was performed using 25 mL of Visipaque dye at 12 mL per second.  The overall LVEF is estimated at greater than 60% without focal wall motion abnormalities.  IMPRESSION:  Mr. Rio stent widely patent.  He has no other significant CAD.  Unsure the etiology of his chest pain.  Continued medical therapy will be recommended.  Sheath was removed and pressure was held in the groin to achieve hemostasis.  The patient left the lab in stable condition.  He will be discharged home later today as an outpatient and we will see him back in the office in 1 week for followup.     Nanetta Batty, M.D.     Cordelia Pen  D:  02/09/2011  T:  02/10/2011  Job:  [id number]  cc:   Second [facility] [place] Dorothyann Peng, MD  Electronically Signed by Nanetta Batty M.D. on 02/22/2011 01:59:31 PM

## 2011-03-01 LAB — DIFFERENTIAL
Basophils Absolute: 0 10*3/uL (ref 0.0–0.1)
Eosinophils Relative: 7 % — ABNORMAL HIGH (ref 0–5)
Lymphocytes Relative: 35 % (ref 12–46)
Neutro Abs: 2.7 10*3/uL (ref 1.7–7.7)
Neutrophils Relative %: 47 % (ref 43–77)

## 2011-03-01 LAB — POCT I-STAT, CHEM 8
BUN: 21 mg/dL (ref 6–23)
Potassium: 3.7 mEq/L (ref 3.5–5.1)
Sodium: 141 mEq/L (ref 135–145)
TCO2: 26 mmol/L (ref 0–100)

## 2011-03-01 LAB — CBC
HCT: 39.4 % (ref 39.0–52.0)
Platelets: 204 10*3/uL (ref 150–400)
RDW: 14.1 % (ref 11.5–15.5)

## 2011-03-12 ENCOUNTER — Encounter: Payer: Self-pay | Admitting: Cardiovascular Disease

## 2011-03-14 DIAGNOSIS — R0989 Other specified symptoms and signs involving the circulatory and respiratory systems: Secondary | ICD-10-CM

## 2011-03-14 HISTORY — DX: Other specified symptoms and signs involving the circulatory and respiratory systems: R09.89

## 2011-04-28 NOTE — Op Note (Signed)
[place]. [facility]  Patient:    Jason Neal, Jason Neal Visit Number: [id number] MRN: [id number]          Service Type: SUR Location: [id number] Attending Physician:  Jackelyn Knife Dictated by:   Izell Bunnlevel Elesa Hacker, M.D. 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  PROCEDURE:  Anterior cervical diskectomy and fusion, C6-7, with interbody cage and autograft.  SURGEON:  Izell Williams. Elesa Hacker, M.D.  ASSISTANT:  Clydene Fake, M.D.  DESCRIPTION OF PROCEDURE:  Under general endotracheal anesthesia, this man was positioned supine on the operating table with his head held in 10 pounds of Holter traction.  The exposed anterior portion of his cervical region was prepped and draped in the usual manner.  The C-arm was draped into place to be utilized throughout the procedure.  The C-arm fluoroscopy was used initially to help position the incision site to its greatest advantage.  After this, the incision was made on the left side of the neck and extended from the midline anteriorly, obliquely to the anterior border of the sternocleidomastoid.  Dissection was carried through subcutaneous tissue and platysma layers and then through the strap muscles to the anterior vertebral border.  A metallic marker was put into the C6-7 intervertebral space, and additional fluoroscopy was utilized for positive identification of the proper operative level.  Following this, self-retaining retractors were put in place.  The annulus of the C6-7 disk was excised and the disk material itself was removed.  Central and left-sided particularly more laterally, a piece of soft disk herniated material was encountered and removed.  Using the operating microscope and the high-speed  drill, the intervertebral disk space was cleared of its contents, and osteophytic protrusions were also removed.  After this, a 12 mm BAK interbody cervical cage was put into place after appropriate preparation of the diskectomy site.  An AP and crosstable lateral view were taken and saved using the C-arm fluoroscopy.  The wound was carefully irrigated and then it was closed in layers.  A sterile dressing was applied and after that a soft cervical collar.  The patient was then taken to the recovery room in good condition. Dictated by:   Izell Deepwater Elesa Hacker, M.D. Attending Physician:  Jackelyn Knife DD:  11/28/01 TD:  11/29/01 Job: [id number]

## 2011-04-28 NOTE — H&P (Signed)
[place]. [facility]  Patient:    Jason Neal, Jason Neal Visit Number: [id number] MRN: [id number]          Service Type: Attending:  Izell Liberty. Elesa Hacker, M.D. Dictated by:   Izell Keller Elesa Hacker, M.D. Adm. Date:  11/28/01                           History and Physical  HISTORY OF PRESENT ILLNESS:  Jason Neal, Jason Neal., is a generally healthy 60 year old male seen through the courtesy of Dr. Thana Ates.  He presented initially in the early part of November with a six to eight-week history of neck pain with radiation of pain toward his left shoulder, across his scapula, and on down his arm, associated with some numbness and tingling primarily of the second or index finger of his left hand.  He also had some numbness and tingling involving the third finger as well.  He had spontaneously found out that rotation of the head and neck to the left side, particularly with extension, increased his arm pain, finger numbness, and tingling.  His right arm has been uninvolved, and he has had no difficulty so far with his legs bowel or bladder control.  At the time of the initial visit, it seemed that he was improving to some extent.  He was treated with a steroid dosepak and a soft cervical collar and followed conservatively.  Over the next few weeks he seemed to get worse again and it became apparent that surgical decompression probably was going to be required.  He was seen back in the office on December 2 and again on December 16.  He had undergone an MRI, which showed multiple levels of spondylosis with some disk herniation, but it was felt that the broad-based disk protrusion at C6-7 on the left showed evidence of nerve root compression, and it was felt that this was the symptomatic level.  At the time of his last visit here on the 16th, a careful explanation of the pathology involved was carried out.  A careful explanation of an anterior cervical diskectomy and fusion utilizing  the intervertebral cage likewise was carried out.  The goals and risks were discussed in detail.  He understands the risks, including the risk of bleeding, infection, damage to the neck structures including carotid artery, trachea, esophagus.  He understands that there can be damage to nerve roots, spinal cord, with resulting paralysis, loss of sensation, and perhaps even vertebral artery.  He understands that using hardware carries its own set of risks and that there can be problems with hardware, including malpositioning or shifting over time or infection as already mentioned.  PAST MEDICAL HISTORY:  Pertinent in that he has had some high blood pressure and also some increased lipids.  FAMILY HISTORY:  His mother is living at age 38 with high blood pressure and heart disease.  His father died at age 11 of a heart attack.  He has two brothers living and well.  He has three sisters living, one of whom has high blood pressure.  One has fibromyalgia and one with heart disease.  He has two children living and well.  PAST SURGICAL HISTORY:  Removal of bone spur in his shoulder.  He had repair of an abdominal hernia in 2002.  MEDICATIONS:  Tiazac, Prilosec, Accupril, Tricor, and a low dose of aspirin.  SOCIAL HISTORY:  He does not smoke or use alcohol.  He is married.  He  is employed as a Holiday representative at [employer].  REVIEW OF SYSTEMS:  Pertinent as already mentioned.  PHYSICAL EXAMINATION:  VITAL SIGNS:  He weighs 234 pounds.  Blood pressure was 148/94 with a pulse of 62.  Temperature was 98.5.  GENERAL:  He is alert and cooperative.  He is a healthy-appearing individual and is muscular.  HEENT:  Within normal limits.  NECK:  He has no cervical adenopathy or mass lesions, and he has no carotid or supraclavicular bruits.  Range of motion of his cervical spine is limited, particularly on rotation to the left and on hyperextension.  NEUROLOGIC:  Motor examination  shows left triceps weakness, which is significant and reproducible.  He has diminished sensation of the second and third fingers of his left hand.  Deep tendon reflexes are brisk and symmetrical with the exception of a diminished, although still present, left triceps reflex.  Gait and coordination and all the Romberg tests are negative.  IMPRESSION:  Spondylosis and soft disk herniation, central and left, C6-7. Dictated by:   Izell Seville Elesa Hacker, M.D. Attending:  Izell Foxfield. Elesa Hacker, M.D. DD:  11/27/01 TD:  11/27/01 Job: [id number]

## 2012-02-09 DIAGNOSIS — I480 Paroxysmal atrial fibrillation: Secondary | ICD-10-CM

## 2012-02-09 HISTORY — DX: Paroxysmal atrial fibrillation: I48.0

## 2012-03-04 ENCOUNTER — Encounter (HOSPITAL_COMMUNITY)
Admission: EM | Disposition: A | Discharge status: Home / Self Care | Payer: Self-pay | Source: Home / Self Care | Attending: Cardiovascular Disease

## 2012-03-04 ENCOUNTER — Inpatient Hospital Stay (HOSPITAL_COMMUNITY)
Admission: EM | Admit: 2012-03-04 | Discharge: 2012-03-07 | DRG: 287 | Disposition: A | Discharge status: Home / Self Care | Payer: 59 | Attending: Cardiovascular Disease | Admitting: Cardiovascular Disease

## 2012-03-04 ENCOUNTER — Encounter (HOSPITAL_COMMUNITY): Payer: Self-pay | Admitting: Neurology

## 2012-03-04 ENCOUNTER — Other Ambulatory Visit: Payer: Self-pay

## 2012-03-04 ENCOUNTER — Emergency Department (HOSPITAL_COMMUNITY): Payer: 59

## 2012-03-04 DIAGNOSIS — E7800 Pure hypercholesterolemia, unspecified: Secondary | ICD-10-CM | POA: Diagnosis present

## 2012-03-04 DIAGNOSIS — I4891 Unspecified atrial fibrillation: Secondary | ICD-10-CM | POA: Diagnosis present

## 2012-03-04 DIAGNOSIS — I200 Unstable angina: Secondary | ICD-10-CM | POA: Diagnosis present

## 2012-03-04 DIAGNOSIS — Z9861 Coronary angioplasty status: Secondary | ICD-10-CM

## 2012-03-04 DIAGNOSIS — E785 Hyperlipidemia, unspecified: Secondary | ICD-10-CM | POA: Diagnosis present

## 2012-03-04 DIAGNOSIS — I2510 Atherosclerotic heart disease of native coronary artery without angina pectoris: Principal | ICD-10-CM | POA: Diagnosis present

## 2012-03-04 DIAGNOSIS — I10 Essential (primary) hypertension: Secondary | ICD-10-CM | POA: Diagnosis present

## 2012-03-04 DIAGNOSIS — Z6831 Body mass index (BMI) 31.0-31.9, adult: Secondary | ICD-10-CM

## 2012-03-04 DIAGNOSIS — G4730 Sleep apnea, unspecified: Secondary | ICD-10-CM | POA: Diagnosis present

## 2012-03-04 HISTORY — PX: LEFT HEART CATHETERIZATION WITH CORONARY ANGIOGRAM: SHX5451

## 2012-03-04 HISTORY — DX: Essential (primary) hypertension: I10

## 2012-03-04 HISTORY — DX: Unspecified osteoarthritis, unspecified site: M19.90

## 2012-03-04 HISTORY — DX: Sleep apnea, unspecified: G47.30

## 2012-03-04 HISTORY — PX: OTHER SURGICAL HISTORY: SHX169

## 2012-03-04 HISTORY — DX: Atherosclerotic heart disease of native coronary artery without angina pectoris: I25.10

## 2012-03-04 HISTORY — DX: Paroxysmal atrial fibrillation: I48.0

## 2012-03-04 HISTORY — DX: Pure hypercholesterolemia, unspecified: E78.00

## 2012-03-04 HISTORY — DX: Hyperlipidemia, unspecified: E78.5

## 2012-03-04 HISTORY — DX: Gastro-esophageal reflux disease without esophagitis: K21.9

## 2012-03-04 LAB — CBC
HCT: 43 % (ref 39.0–52.0)
Hemoglobin: 14.7 g/dL (ref 13.0–17.0)
Hemoglobin: 15.3 g/dL (ref 13.0–17.0)
MCH: 30.8 pg (ref 26.0–34.0)
MCHC: 35.6 g/dL (ref 30.0–36.0)
MCV: 86.2 fL (ref 78.0–100.0)
MCV: 86.5 fL (ref 78.0–100.0)
Platelets: 175 10*3/uL (ref 150–400)
Platelets: 188 10*3/uL (ref 150–400)
RBC: 4.87 MIL/uL (ref 4.22–5.81)
RBC: 4.97 MIL/uL (ref 4.22–5.81)
RDW: 13.4 % (ref 11.5–15.5)
WBC: 5.9 10*3/uL (ref 4.0–10.5)
WBC: 7.4 10*3/uL (ref 4.0–10.5)

## 2012-03-04 LAB — CARDIAC PANEL(CRET KIN+CKTOT+MB+TROPI)
CK, MB: 3.4 ng/mL (ref 0.3–4.0)
CK, MB: 3.2 ng/mL (ref 0.3–4.0)
Relative Index: 2.4 (ref 0.0–2.5)
Relative Index: 2.6 — ABNORMAL HIGH (ref 0.0–2.5)
Total CK: 143 U/L (ref 7–232)
Total CK: 124 U/L (ref 7–232)
Troponin I: <0.3 ng/mL (ref <0.30)
Troponin I: <0.3 ng/mL (ref <0.30)

## 2012-03-04 LAB — COMPREHENSIVE METABOLIC PANEL
ALT: 22 U/L (ref 0–53)
ALT: 22 U/L (ref 0–53)
AST: 25 U/L (ref 0–37)
AST: 27 U/L (ref 0–37)
Albumin: 3.9 g/dL (ref 3.5–5.2)
Alkaline Phosphatase: 46 U/L (ref 39–117)
BUN: 17 mg/dL (ref 6–23)
CO2: 25 mEq/L (ref 19–32)
CO2: 24 mEq/L (ref 19–32)
Calcium: 9.8 mg/dL (ref 8.4–10.5)
Chloride: 104 mEq/L (ref 96–112)
Chloride: 102 mEq/L (ref 96–112)
Creatinine, Ser: 1.2 mg/dL (ref 0.50–1.35)
Creatinine, Ser: 1.1 mg/dL (ref 0.50–1.35)
GFR calc Af Amer: 81 mL/min — ABNORMAL LOW (ref >90)
GFR calc non Af Amer: 63 mL/min — ABNORMAL LOW (ref >90)
GFR calc non Af Amer: 70 mL/min — ABNORMAL LOW (ref >90)
Glucose, Bld: 134 mg/dL — ABNORMAL HIGH (ref 70–99)
Potassium: 3.3 mEq/L — ABNORMAL LOW (ref 3.5–5.1)
Sodium: 141 mEq/L (ref 135–145)
Sodium: 139 mEq/L (ref 135–145)
Total Bilirubin: 0.5 mg/dL (ref 0.3–1.2)
Total Bilirubin: 0.4 mg/dL (ref 0.3–1.2)
Total Protein: 7.7 g/dL (ref 6.0–8.3)

## 2012-03-04 LAB — DIFFERENTIAL
Basophils Absolute: 0 10*3/uL (ref 0.0–0.1)
Basophils Relative: 0 % (ref 0–1)
Eosinophils Absolute: 0.3 10*3/uL (ref 0.0–0.7)
Eosinophils Relative: 4 % (ref 0–5)
Lymphocytes Relative: 31 % (ref 12–46)
Lymphs Abs: 2.5 10*3/uL (ref 0.7–4.0)
Monocytes Absolute: 0.5 10*3/uL (ref 0.1–1.0)
Monocytes Relative: 6 % (ref 3–12)
Neutro Abs: 4.9 10*3/uL (ref 1.7–7.7)
Neutrophils Relative %: 60 % (ref 43–77)

## 2012-03-04 LAB — MAGNESIUM: Magnesium: 1.7 mg/dL (ref 1.5–2.5)

## 2012-03-04 LAB — TSH: TSH: 3.032 u[IU]/mL (ref 0.350–4.500)

## 2012-03-04 LAB — PROTIME-INR
INR: 0.92 (ref 0.00–1.49)
Prothrombin Time: 12.6 seconds (ref 11.6–15.2)

## 2012-03-04 LAB — POCT ACTIVATED CLOTTING TIME: Activated Clotting Time: 160 seconds

## 2012-03-04 IMAGING — CR DG CHEST 2V
2 series · 2 of 2 positions shown · non-contrast
Comparison: None

CLINICAL DATA: Chest pain.

CHEST - 2 VIEW

[w chest pa]
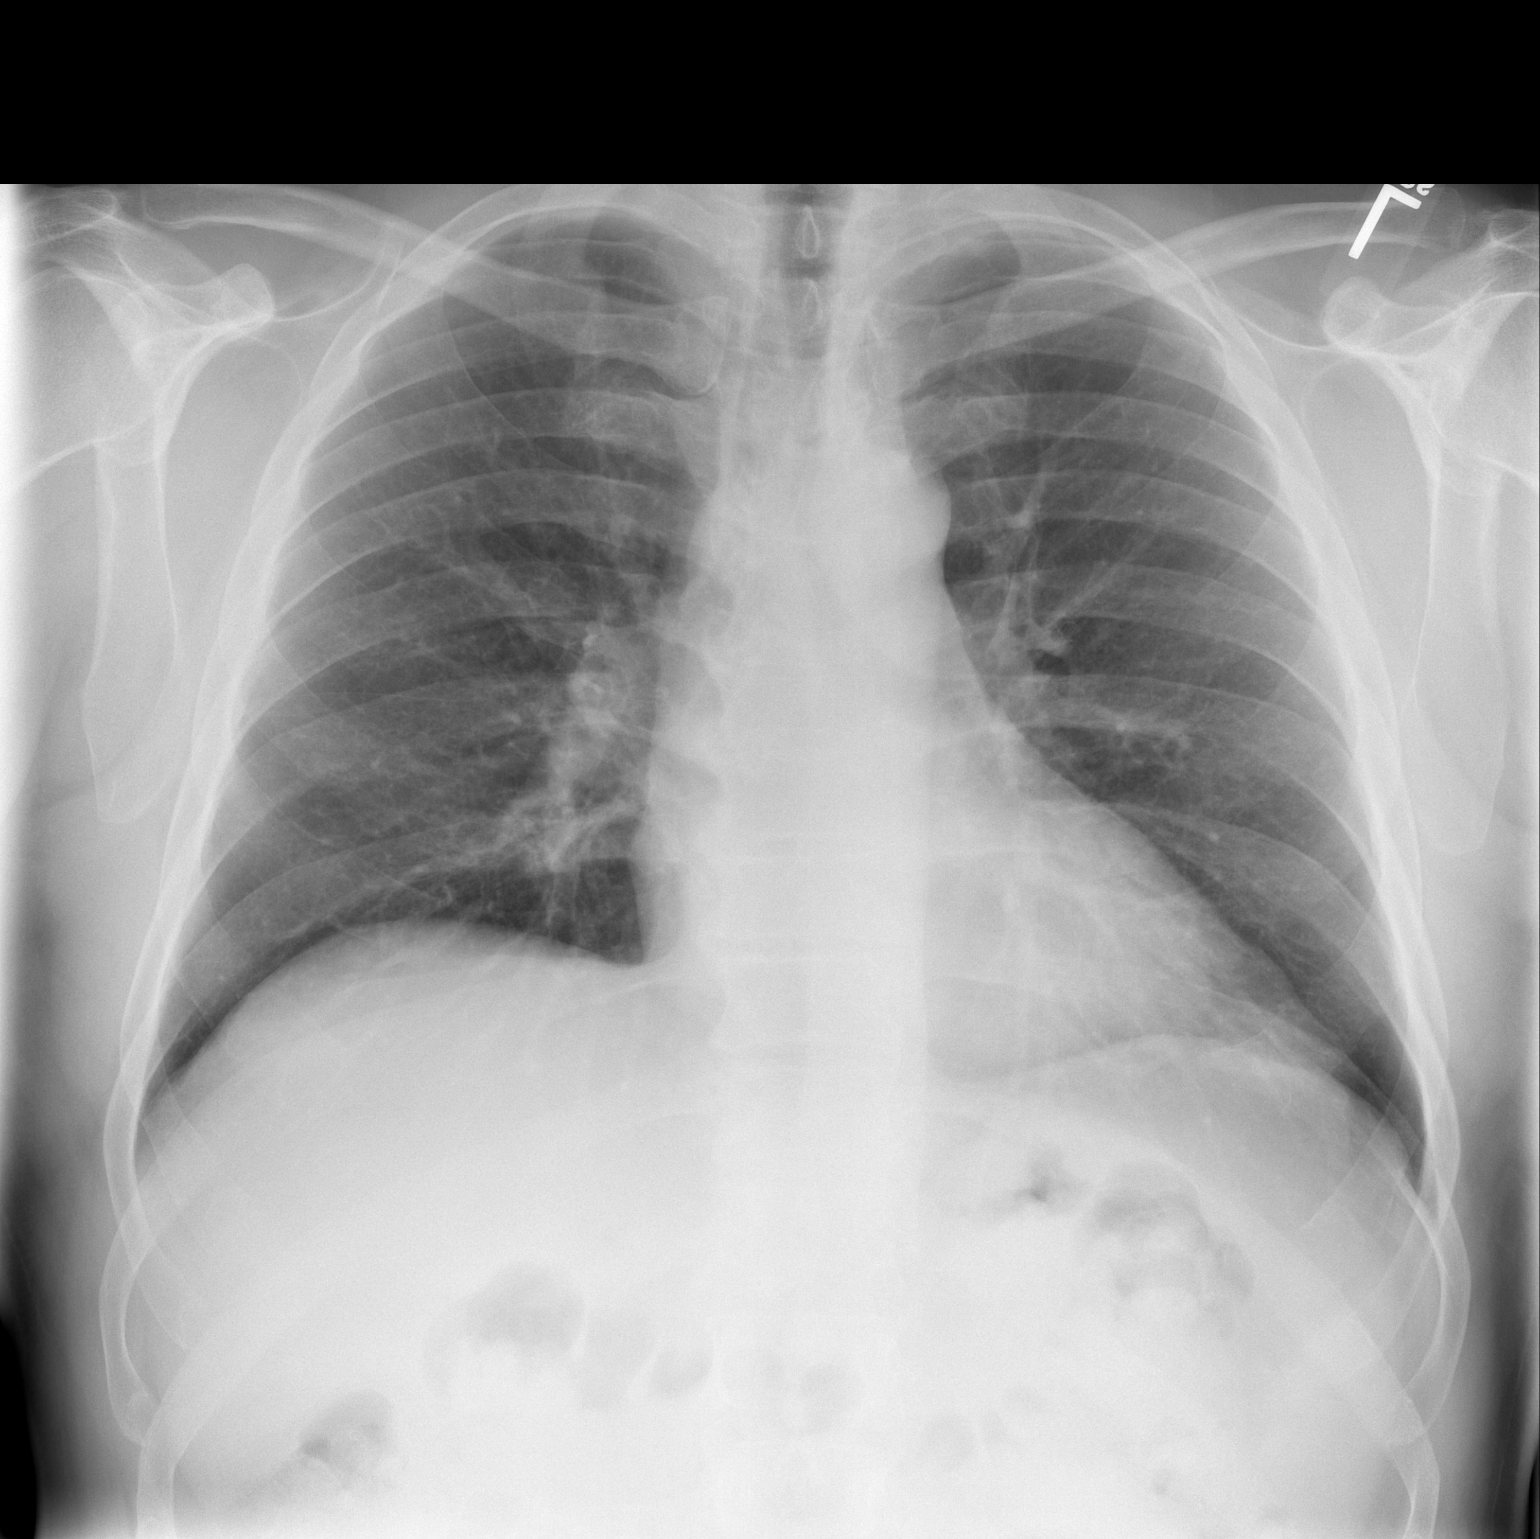

[w chest lat]
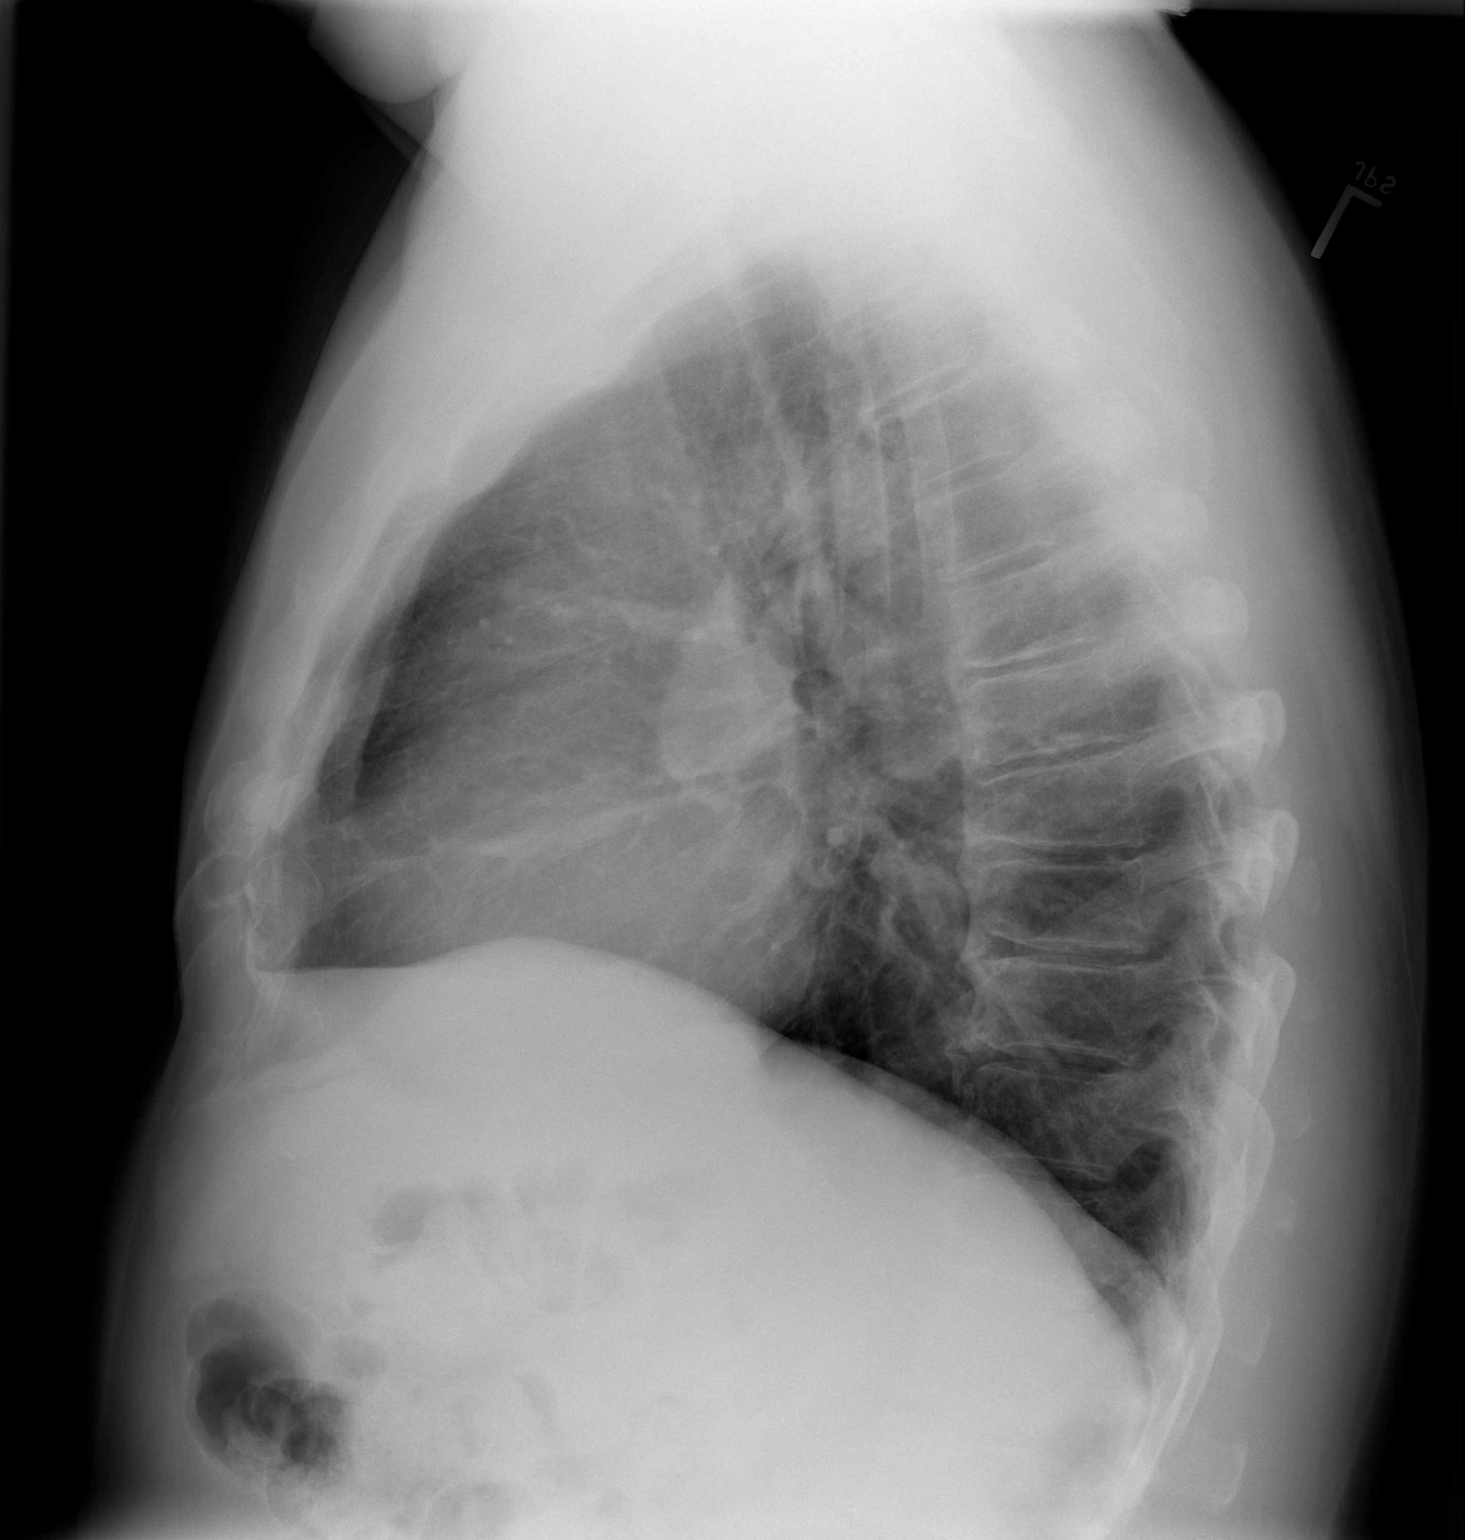

[2 of 2 positions shown; findings below may reference images not displayed]

FINDINGS: The cardiac silhouette, mediastinal and hilar contours
are within normal limits.  The lungs are clear.  No pleural
effusion.  The bony thorax is intact.  Artifact overlying the lower
neck is likely a button or snap.
IMPRESSION: No acute cardiopulmonary findings.

## 2012-03-04 SURGERY — LEFT HEART CATHETERIZATION WITH CORONARY ANGIOGRAM
Anesthesia: LOCAL

## 2012-03-04 MED ORDER — LIDOCAINE HCL (PF) 1 % IJ SOLN
INTRAMUSCULAR | Status: AC
  Filled 2012-03-04: qty 30

## 2012-03-04 MED ORDER — NITROGLYCERIN 0.2 MG/ML ON CALL CATH LAB
INTRAVENOUS | Status: AC
  Filled 2012-03-04: qty 1

## 2012-03-04 MED ORDER — METOPROLOL TARTRATE 50 MG PO TABS
50.0000 mg | ORAL_TABLET | Freq: Two times a day (BID) | ORAL | Status: DC
  Administered 2012-03-05 – 2012-03-06 (×4): 50 mg via ORAL
  Filled 2012-03-04 (×7): qty 1

## 2012-03-04 MED ORDER — SODIUM CHLORIDE 0.9 % IJ SOLN: 3.0000 mL | Freq: Two times a day (BID) | INTRAMUSCULAR | Status: DC

## 2012-03-04 MED ORDER — ZOLPIDEM TARTRATE 5 MG PO TABS: 10.0000 mg | ORAL_TABLET | Freq: Every evening | ORAL | Status: DC | PRN

## 2012-03-04 MED ORDER — SODIUM CHLORIDE 0.9 % IJ SOLN: 3.0000 mL | INTRAMUSCULAR | Status: DC | PRN

## 2012-03-04 MED ORDER — DIAZEPAM 5 MG PO TABS: 5.0000 mg | ORAL_TABLET | ORAL | Status: DC

## 2012-03-04 MED ORDER — LOSARTAN POTASSIUM 50 MG PO TABS
100.0000 mg | ORAL_TABLET | Freq: Every day | ORAL | Status: DC
  Administered 2012-03-04 – 2012-03-06 (×3): 100 mg via ORAL
  Filled 2012-03-04 (×4): qty 2

## 2012-03-04 MED ORDER — RIVAROXABAN 10 MG PO TABS
20.0000 mg | ORAL_TABLET | Freq: Every day | ORAL | Status: DC
  Filled 2012-03-04: qty 2

## 2012-03-04 MED ORDER — ONDANSETRON HCL 4 MG/2ML IJ SOLN: 4.0000 mg | Freq: Four times a day (QID) | INTRAMUSCULAR | Status: DC | PRN

## 2012-03-04 MED ORDER — MIDAZOLAM HCL 2 MG/2ML IJ SOLN
INTRAMUSCULAR | Status: AC
  Filled 2012-03-04: qty 2

## 2012-03-04 MED ORDER — ASPIRIN 81 MG PO CHEW: 324.0000 mg | CHEWABLE_TABLET | ORAL | Status: DC

## 2012-03-04 MED ORDER — VITAMIN D3 25 MCG (1000 UNIT) PO TABS
1000.0000 [IU] | ORAL_TABLET | Freq: Every day | ORAL | Status: DC
  Administered 2012-03-04 – 2012-03-06 (×3): 1000 [IU] via ORAL
  Filled 2012-03-04 (×4): qty 1

## 2012-03-04 MED ORDER — VITAMIN D3 25 MCG (1000 UNIT) PO TABS
2000.0000 [IU] | ORAL_TABLET | Freq: Every day | ORAL | Status: DC
  Administered 2012-03-05 – 2012-03-06 (×2): 2000 [IU] via ORAL
  Filled 2012-03-04 (×3): qty 2

## 2012-03-04 MED ORDER — ASPIRIN 81 MG PO CHEW
324.0000 mg | CHEWABLE_TABLET | Freq: Once | ORAL | Status: AC
  Administered 2012-03-04: 243 mg via ORAL
  Filled 2012-03-04: qty 4

## 2012-03-04 MED ORDER — HEPARIN (PORCINE) IN NACL 100-0.45 UNIT/ML-% IJ SOLN
1300.0000 [IU]/h | INTRAMUSCULAR | Status: DC
  Administered 2012-03-04: 1300 [IU]/h via INTRAVENOUS
  Filled 2012-03-04 (×2): qty 250

## 2012-03-04 MED ORDER — NITROGLYCERIN IN D5W 200-5 MCG/ML-% IV SOLN
3.0000 ug/min | INTRAVENOUS | Status: DC
  Administered 2012-03-04: 3 ug/min via INTRAVENOUS
  Filled 2012-03-04: qty 250

## 2012-03-04 MED ORDER — NITROGLYCERIN 0.4 MG SL SUBL: 0.4000 mg | SUBLINGUAL_TABLET | SUBLINGUAL | Status: DC | PRN

## 2012-03-04 MED ORDER — SODIUM CHLORIDE 0.9 % IV SOLN: 250.0000 mL | INTRAVENOUS | Status: DC | PRN

## 2012-03-04 MED ORDER — CLOPIDOGREL BISULFATE 75 MG PO TABS
75.0000 mg | ORAL_TABLET | Freq: Every day | ORAL | Status: DC
  Administered 2012-03-05: 75 mg via ORAL
  Filled 2012-03-04 (×2): qty 1

## 2012-03-04 MED ORDER — ASPIRIN EC 81 MG PO TBEC: 81.0000 mg | DELAYED_RELEASE_TABLET | Freq: Every day | ORAL | Status: DC

## 2012-03-04 MED ORDER — ATORVASTATIN CALCIUM 40 MG PO TABS
40.0000 mg | ORAL_TABLET | Freq: Every day | ORAL | Status: DC
  Administered 2012-03-04 – 2012-03-06 (×3): 40 mg via ORAL
  Filled 2012-03-04 (×4): qty 1

## 2012-03-04 MED ORDER — HEPARIN BOLUS VIA INFUSION
4000.0000 [IU] | Freq: Once | INTRAVENOUS | Status: AC
  Administered 2012-03-04: 4000 [IU] via INTRAVENOUS

## 2012-03-04 MED ORDER — SODIUM CHLORIDE 0.9 % IV SOLN
INTRAVENOUS | Status: AC
  Administered 2012-03-04: 19:00:00 via INTRAVENOUS

## 2012-03-04 MED ORDER — SODIUM CHLORIDE 0.9 % IV SOLN
INTRAVENOUS | Status: DC
  Administered 2012-03-04: 20 mL/h via INTRAVENOUS

## 2012-03-04 MED ORDER — ASPIRIN 81 MG PO CHEW: 81.0000 mg | CHEWABLE_TABLET | Freq: Every day | ORAL | Status: DC

## 2012-03-04 MED ORDER — VITAMIN D3 25 MCG (1000 UNIT) PO TABS: 1000.0000 [IU] | ORAL_TABLET | ORAL | Status: DC

## 2012-03-04 MED ORDER — CLOPIDOGREL BISULFATE 75 MG PO TABS: 75.0000 mg | ORAL_TABLET | Freq: Every day | ORAL | Status: DC

## 2012-03-04 MED ORDER — ACETAMINOPHEN 325 MG PO TABS: 650.0000 mg | ORAL_TABLET | ORAL | Status: DC | PRN

## 2012-03-04 MED ORDER — HEPARIN (PORCINE) IN NACL 2-0.9 UNIT/ML-% IJ SOLN
INTRAMUSCULAR | Status: AC
  Filled 2012-03-04: qty 1000

## 2012-03-04 MED ORDER — ALPRAZOLAM 0.25 MG PO TABS: 0.2500 mg | ORAL_TABLET | Freq: Two times a day (BID) | ORAL | Status: DC | PRN

## 2012-03-04 MED ORDER — B COMPLEX-C PO TABS
1.0000 | ORAL_TABLET | Freq: Every day | ORAL | Status: DC
  Administered 2012-03-04 – 2012-03-06 (×3): 1 via ORAL
  Filled 2012-03-04 (×4): qty 1

## 2012-03-04 MED ORDER — DILTIAZEM HCL ER COATED BEADS 180 MG PO CP24
180.0000 mg | ORAL_CAPSULE | Freq: Every day | ORAL | Status: DC
  Administered 2012-03-04 – 2012-03-06 (×3): 180 mg via ORAL
  Filled 2012-03-04 (×4): qty 1

## 2012-03-04 MED ORDER — MORPHINE SULFATE 2 MG/ML IJ SOLN: 1.0000 mg | INTRAMUSCULAR | Status: DC | PRN

## 2012-03-04 MED ORDER — RIVAROXABAN 10 MG PO TABS
20.0000 mg | ORAL_TABLET | Freq: Every day | ORAL | Status: DC
  Administered 2012-03-04 – 2012-03-06 (×3): 20 mg via ORAL
  Filled 2012-03-04 (×4): qty 2

## 2012-03-04 MED ORDER — HEPARIN (PORCINE) IN NACL 100-0.45 UNIT/ML-% IJ SOLN
1300.0000 [IU]/h | INTRAMUSCULAR | Status: DC
  Filled 2012-03-04: qty 250

## 2012-03-04 MED ORDER — SODIUM CHLORIDE 0.9 % IV SOLN: INTRAVENOUS | Status: DC

## 2012-03-04 MED ORDER — FENTANYL CITRATE 0.05 MG/ML IJ SOLN
INTRAMUSCULAR | Status: AC
  Filled 2012-03-04: qty 2

## 2012-03-04 MED ORDER — ASPIRIN EC 81 MG PO TBEC
81.0000 mg | DELAYED_RELEASE_TABLET | Freq: Every day | ORAL | Status: DC
  Administered 2012-03-05 – 2012-03-06 (×2): 81 mg via ORAL
  Filled 2012-03-04 (×3): qty 1

## 2012-03-04 MED ORDER — PANTOPRAZOLE SODIUM 40 MG PO TBEC
40.0000 mg | DELAYED_RELEASE_TABLET | Freq: Every day | ORAL | Status: DC
  Administered 2012-03-04 – 2012-03-07 (×4): 40 mg via ORAL
  Filled 2012-03-04 (×4): qty 1

## 2012-03-04 NOTE — H&P (Signed)
Patient ID: Jason Neal MRN: [id number], DOB/AGE: 63-02-1951   Admit date: 03/04/2012   Primary Physician: Dennison Mascot, MD, MD Primary Cardiologist: Dr Allyson Sabal  HPI: 61 y/o with a history of CAD and PAF. His last cath 3/12 showed patent CFX stent with a smooth 50-60% narrowing after this, treated medically. He just saw Dr Allyson Sabal in the clinic 02/26/12 and was doing well. He woke up this am with pressure in his chest. He could tell his HR was irregular. He has had PAF in the past but has not been on Coumadin. No recent angina. 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 In the ER he is in AF with VR 80s with vague chest pressure that radiates to his posterior shoulders. He is admitted now for further eval.   Problem List: Past Medical History  Diagnosis Date  . Hypercholesteremia   . Hypertension   . Dyslipidemia   . PAF (paroxysmal atrial fibrillation)   . Sleep apnea     on C-Pap  . DJD (degenerative joint disease)     c-spine  . CAD (coronary artery disease)     prior CFX PCI    Past Surgical History  Procedure Date  . Coronary angioplasty with stent placement   . C-spine surgery 2001     Allergies: No Known Allergies   Home Medications Nexium Lipitor40 Diltiazem 180 Metoprolol 50mg  BID Losartan 100mg   Plavix ASA81 BID Vit D  No family history on file.   History   Social History  . Marital Status: Married    Spouse Name: N/A    Number of Children: N/A  . Years of Education: N/A   Occupational History  . Not on file.   Social History Main Topics  . Smoking status: Never Smoker   . Smokeless tobacco: Not on file  . Alcohol Use: No  . Drug Use:   . Sexually Active:    Other Topics Concern  . Not on file   Social History Narrative  . No narrative on file     Review of Systems: No GI bleeding, no recent illness General: negative for chills, fever, night sweats or weight changes.  Cardiovascular:  negative for chest pain, dyspnea on exertion, edema, orthopnea, palpitations, paroxysmal nocturnal dyspnea or shortness of breath Dermatological: negative for rash Respiratory: negative for cough or wheezing Urologic: negative for hematuria Abdominal: negative for nausea, vomiting, diarrhea, bright red blood per rectum, melena, or hematemesis Neurologic: negative for visual changes, syncope, or dizziness All other systems reviewed and are otherwise negative except as noted above.  Physical Exam: Blood pressure 149/76, pulse 70, temperature 98 F (36.7 C), temperature source Oral, resp. rate 19, SpO2 99.00%.  General appearance: alert, cooperative and no distress Neck: no carotid bruit, no JVD and supple, symmetrical, trachea midline Lungs: clear to auscultation bilaterally Heart: irregularly irregular rhythm Abdomen: soft, non-tender; bowel sounds normal; no masses,  no organomegaly Extremities: no edema Pulses: 2+ and symmetric Skin: Skin color, texture, turgor normal. No rashes or lesions Neurologic: Grossly normal    Labs:   Results for orders placed during the hospital encounter of 03/04/12 (from the past 24 hour(s))  CBC     Status: Normal   Collection Time   03/04/12  9:00 AM      Component Value Range   WBC 5.9  4.0 - 10.5 (K/uL)   RBC 4.87  4.22 - 5.81 (MIL/uL)   Hemoglobin 14.7  13.0 - 17.0 (g/dL)  HCT 42.0  39.0 - 52.0 (%)   MCV 86.2  78.0 - 100.0 (fL)   MCH 30.2  26.0 - 34.0 (pg)   MCHC 35.0  30.0 - 36.0 (g/dL)   RDW 16.1  09.6 - 04.5 (%)   Platelets 175  150 - 400 (K/uL)  COMPREHENSIVE METABOLIC PANEL     Status: Abnormal   Collection Time   03/04/12  9:00 AM      Component Value Range   Sodium 141  135 - 145 (mEq/L)   Potassium 3.9  3.5 - 5.1 (mEq/L)   Chloride 104  96 - 112 (mEq/L)   CO2 25  19 - 32 (mEq/L)   Glucose, Bld 139 (*) 70 - 99 (mg/dL)   BUN 19  6 - 23 (mg/dL)   Creatinine, Ser 4.09  0.50 - 1.35 (mg/dL)   Calcium 81.1  8.4 - 10.5 (mg/dL)    Total Protein 7.8  6.0 - 8.3 (g/dL)   Albumin 4.0  3.5 - 5.2 (g/dL)   AST 25  0 - 37 (U/L)   ALT 22  0 - 53 (U/L)   Alkaline Phosphatase 46  39 - 117 (U/L)   Total Bilirubin 0.5  0.3 - 1.2 (mg/dL)   GFR calc non Af Amer 63 (*) >90 (mL/min)   GFR calc Af Amer 73 (*) >90 (mL/min)  TROPONIN I     Status: Normal   Collection Time   03/04/12  9:00 AM      Component Value Range   Troponin I <0.30  <0.30 (ng/mL)     Radiology/Studies: Dg Chest 2 View  03/04/2012  *RADIOLOGY REPORT*  Clinical Data: Chest pain.  CHEST - 2 VIEW  Comparison: None  Findings: The cardiac silhouette, mediastinal and hilar contours are within normal limits.  The lungs are clear.  No pleural effusion.  The bony thorax is intact.  Artifact overlying the lower neck is likely a button or snap.  IMPRESSION: No acute cardiopulmonary findings.  Original Report Authenticated By: P. Loralie Champagne, M.D.    EKG:AF with CVR  ASSESSMENT AND PLAN:  Principal Problem:  *Unstable angina Active Problems:  Atrial fibrillation, hx of PAF but he has been in NSR  CAD, prior CFX PCI, last cath [facility] 3/12  Dyslipidemia  HTN (hypertension)  Sleep apnea, on C-Pap  Plan- Cath today. We'll need to consider long term anticoagulation.  SignedAbelino Derrick, PA-C 03/04/2012, 12:42 PMAgree with note written by Corine Shelter PAC  Pt well known to me. I recently saw him in the office, H/O stents in past. Ant T/O RCA. PAF. Other probs as outlined. Developed Afib at home this morning with anterior chest pressure radiating to both jaws and shoulders. Recent strenuous physical work. On iv hep and NTG. Exam benign. EKG AFIB with CVR, no acute STTWC. Labs ok. Plan cor angio. Agree with long term anticoagulation. Adjust meds.  Runell Gess 03/04/2012 1:35 PM

## 2012-03-04 NOTE — ED Notes (Signed)
Pt reporting cp tightness this morning, radiation to sternum and neck reporting 4/10 pain. Denying any sob. Reporting headache. Denying any n/v/dizziness. Pt reporting stiffness in back from heavy lifting last week. Alert and oriented x 4. Speaking in clear and concise sentences. Pt reporting hx of afib, 2 stents.

## 2012-03-04 NOTE — Consult Note (Signed)
ANTICOAGULATION CONSULT NOTE - Initial Consult  Pharmacy Consult for Heparin Indication: chest pain/ACS  No Known Allergies  Patient Measurements: Height: 6' (182.9 cm) Weight: 238 lb (107.956 kg) IBW/kg (Calculated) : 77.6  Heparin Dosing Weight: 100.3kg  Vital Signs: Temp: 98 F (36.7 C) (03/25 0854) Temp src: Oral (03/25 0854) BP: 149/76 mmHg (03/25 1141) Pulse Rate: 70  (03/25 1141)  Labs:  Basename 03/04/12 0900  HGB 14.7  HCT 42.0  PLT 175  APTT --  LABPROT --  INR --  HEPARINUNFRC --  CREATININE 1.20  CKTOTAL --  CKMB --  TROPONINI <0.30   Estimated Creatinine Clearance: 81.1 ml/min (by C-G formula based on Cr of 1.2).  Medical History: Past Medical History  Diagnosis Date  . Hypercholesteremia   . Hypertension   . Dyslipidemia   . PAF (paroxysmal atrial fibrillation)   . Sleep apnea     on C-Pap  . DJD (degenerative joint disease)     c-spine  . CAD (coronary artery disease)     prior CFX PCI   Assessment: 60yom with hx CAD/stents/PAF presents to ED with CP and found to be in afib. He will begin heparin. Plan is for CATH today. No anticoagulants prior to admission.   Goal of Therapy:  Heparin level 0.3-0.7 units/ml   Plan:  1) Heparin bolus 4000 units x 1 2) Heparin drip at 1300 units/hr 3) 6h heparin level vs f/u after cath  Fredrik Rigger 03/04/2012,12:46 PM

## 2012-03-04 NOTE — ED Notes (Signed)
Patient denies pain and is resting comfortably.  

## 2012-03-04 NOTE — Progress Notes (Signed)
Pharmacy: Heparin  Patient is now s/p cath and heparin is to begin 4 hours post-sheath removal.  Cath results found patent stents and CP attributed to afib.    Plan: 1) At 1900, begin heparin at 1300 units/hr with no bolus 2) Check 6h heparin level 3) Daily heparin level and CBC 4) Follow up plan for long-term oral anticoagulation

## 2012-03-04 NOTE — ED Notes (Signed)
Cardiology at bedside.

## 2012-03-04 NOTE — Op Note (Signed)
Jason Neal is a 61 y.o. male    [id number] LOCATION:  FACILITY: [facility]  PHYSICIAN: Nanetta Batty, M.D. Aug 19, 1951   DATE OF PROCEDURE:  03/04/2012  DATE OF DISCHARGE:  [facility]  CARDIAC CATHETERIZATION     History obtained from chart review. 61 y/o with a history of CAD and PAF. His last cath 3/12 showed patent CFX stent with a smooth 50-60% narrowing after this, treated medically. He just saw Dr Allyson Sabal in the clinic 02/26/12 and was doing well. He woke up this am with pressure in his chest. He could tell his HR was irregular. He has had PAF in the past but has not been on Coumadin. No recent angina. 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 In the ER he is in AF with VR 80s with vague chest pressure that radiates to his posterior shoulders. He is admitted now for further eval.    PROCEDURE DESCRIPTION:    The patient was brought to the second floor  [facility] lab in the postabsorptive state. He was  premedicated with IV Versed and fentanyl. His right groin was prepped and shaved in usual sterile fashion. Xylocaine 1% was used  for local anesthesia. A 5 French sheath was inserted into the right common femoral  artery using standard Seldinger technique. 5 French right and left Judkins diagnostic catheters along with a 5 French pigtail catheter, AR-2, L1 and L2 catheters were used for selective coronary angiography and left ventriculography respectively. Visipaque dye was used for the entirety of the case. Retrograde aortic, left ventricular and back pressures were recorded.   HEMODYNAMICS:    AO SYSTOLIC/AO DIASTOLIC: 144/88 LV SYSTOLIC/LV DIASTOLIC: 140/14 ANIOGRAPHIC RESULTS:   1. Left main; normal 2. LAD; 50% segmental proximal 3 :WUJ:WJXBJY stent in the mid AV groove there is a high diagonal branch and the ramus distribution distribution that was normal 4. Right coronary artery;  anterior take off with a patent stent in the midportion. I was not able to selectively engage the artery with multiple diagnostic catheters. 5 Left ventriculography; RAO left ventriculogram was performed using  25 mL of Visipaque dye at 12 mL/second. The overall LVEF estimated  60  %without wall motion abnormalities  IMPRESSION:Mr. Maye has patent stents in the circumflex and RCA otherwise noncritical CAD and normal LV function. I his chest pain was related to his atrial fibrillation. An ACT was measured and the sheath was removed. The patient left the lab in stable condition. He'll be re\re heparinized and oral anticoagulant will be started.  Runell Gess MD, [facility] 03/04/2012 2:51 PM

## 2012-03-04 NOTE — ED Notes (Signed)
Dr Berry at bedside.   

## 2012-03-04 NOTE — H&P (Signed)
    Pt was reexamined and existing H & P reviewed. No changes found.  Runell Gess, MD [facility] 03/04/2012 2:00 PM

## 2012-03-04 NOTE — ED Notes (Signed)
Nurse explained delay for patient and patient's wife.  Verbalized understanding and EDP stated patient can eat or drink.  Patient requested only water no ice.  Given to patient. Waiting for cardiology to see patient.

## 2012-03-04 NOTE — ED Provider Notes (Signed)
History     CSN: [id number]  Arrival date & time 03/04/12  0830   First MD Initiated Contact with Patient 03/04/12 0845      Chief Complaint  Patient presents with  . Chest Pain    (Consider location/radiation/quality/duration/timing/severity/associated sxs/prior treatment) Patient is a 61 y.o. male presenting with chest pain. The history is provided by the patient.  Chest Pain Pertinent negatives for primary symptoms include no fever, no shortness of breath, no cough and no abdominal pain.   pt states onset early this morning of chest tightness and felt heart beat irregular. Hx cad, stent 2011, and afib. States normally in normal rhythm, feels afib started this morning. No assoc diaphoresis, nv or sob. No other recent cp or discomfort. No faintness or dizziness. Takes asa and plavix. No recent change in meds. No cough or uri c/o. No leg pain or swelling. No dvt or pe hx. Chest tightness comes and goes, lasts several minutes, no specific exacerbation or alleviating factors.   Past Medical History  Diagnosis Date  . Hypercholesteremia   . Hypertension     Past Surgical History  Procedure Date  . Coronary angioplasty with stent placement     No family history on file.  History  Substance Use Topics  . Smoking status: Never Smoker   . Smokeless tobacco: Not on file  . Alcohol Use: No      Review of Systems  Constitutional: Negative for fever.  HENT: Negative for neck pain.   Eyes: Negative for redness.  Respiratory: Negative for cough and shortness of breath.   Cardiovascular: Positive for chest pain. Negative for leg swelling.  Gastrointestinal: Negative for abdominal pain.  Genitourinary: Negative for flank pain.  Musculoskeletal: Negative for back pain.  Skin: Negative for rash.  Neurological: Negative for headaches.  Hematological: Does not bruise/bleed easily.  Psychiatric/Behavioral: Negative for confusion.    Allergies  Review of patient's allergies  indicates no known allergies.  Home Medications   Current Outpatient Rx  Name Route Sig Dispense Refill  . ASPIRIN EC 81 MG PO TBEC Oral Take 81 mg by mouth daily.    . ATORVASTATIN CALCIUM 40 MG PO TABS Oral Take 40 mg by mouth daily.    . B COMPLEX-C PO TABS Oral Take 1 tablet by mouth daily.    [name] VITAMIN D 1000 UNITS PO TABS Oral Take 1,000-2,000 Units by mouth See admin instructions. 2000units in am, 1000 units in pm    . CLOPIDOGREL BISULFATE 75 MG PO TABS Oral Take 75 mg by mouth daily.    [name] DILTIAZEM HCL ER COATED BEADS 180 MG PO CP24 Oral Take 180 mg by mouth daily.    [name] ESOMEPRAZOLE MAGNESIUM 40 MG PO CPDR Oral Take 40 mg by mouth daily before breakfast.    . LOSARTAN POTASSIUM 100 MG PO TABS Oral Take 100 mg by mouth daily.    [name] METOPROLOL TARTRATE 50 MG PO TABS Oral Take 50 mg by mouth 2 (two) times daily.      BP 164/95  Pulse 70  Temp(Src) 98 F (36.7 C) (Oral)  Resp 16  SpO2 98%  Physical Exam  Nursing note and vitals reviewed. Constitutional: He is oriented to person, place, and time. He appears well-developed and well-nourished. No distress.  HENT:  Head: Atraumatic.  Eyes: Pupils are equal, round, and reactive to light.  Neck: Neck supple. No tracheal deviation present.  Cardiovascular: Normal rate, normal heart sounds and intact distal pulses.  Exam reveals  no gallop and no friction rub.   No murmur heard. Pulmonary/Chest: Effort normal and breath sounds normal. No accessory muscle usage. No respiratory distress.  Abdominal: Soft. Bowel sounds are normal. He exhibits no distension. There is no tenderness.  Musculoskeletal: Normal range of motion. He exhibits no edema and no tenderness.  Neurological: He is alert and oriented to person, place, and time.  Skin: Skin is warm and dry.  Psychiatric: He has a normal mood and affect.    ED Course  Procedures (including critical care time)  Results for orders placed during the hospital encounter of 03/04/12  CBC       Component Value Range   WBC 5.9  4.0 - 10.5 (K/uL)   RBC 4.87  4.22 - 5.81 (MIL/uL)   Hemoglobin 14.7  13.0 - 17.0 (g/dL)   HCT 40.9  81.1 - 91.4 (%)   MCV 86.2  78.0 - 100.0 (fL)   MCH 30.2  26.0 - 34.0 (pg)   MCHC 35.0  30.0 - 36.0 (g/dL)   RDW 78.2  95.6 - 21.3 (%)   Platelets 175  150 - 400 (K/uL)  COMPREHENSIVE METABOLIC PANEL      Component Value Range   Sodium 141  135 - 145 (mEq/L)   Potassium 3.9  3.5 - 5.1 (mEq/L)   Chloride 104  96 - 112 (mEq/L)   CO2 25  19 - 32 (mEq/L)   Glucose, Bld 139 (*) 70 - 99 (mg/dL)   BUN 19  6 - 23 (mg/dL)   Creatinine, Ser 0.86  0.50 - 1.35 (mg/dL)   Calcium 57.8  8.4 - 10.5 (mg/dL)   Total Protein 7.8  6.0 - 8.3 (g/dL)   Albumin 4.0  3.5 - 5.2 (g/dL)   AST 25  0 - 37 (U/L)   ALT 22  0 - 53 (U/L)   Alkaline Phosphatase 46  39 - 117 (U/L)   Total Bilirubin 0.5  0.3 - 1.2 (mg/dL)   GFR calc non Af Amer 63 (*) >90 (mL/min)   GFR calc Af Amer 73 (*) >90 (mL/min)  TROPONIN I      Component Value Range   Troponin I <0.30  <0.30 (ng/mL)   Dg Chest 2 View  03/04/2012  *RADIOLOGY REPORT*  Clinical Data: Chest pain.  CHEST - 2 VIEW  Comparison: None  Findings: The cardiac silhouette, mediastinal and hilar contours are within normal limits.  The lungs are clear.  No pleural effusion.  The bony thorax is intact.  Artifact overlying the lower neck is likely a button or snap.  IMPRESSION: No acute cardiopulmonary findings.  Original Report Authenticated By: P. Loralie Champagne, M.D.       MDM  Ecg, labs. Iv ns. o2 [place]. Reviewed prior charts.   Reviewed nursing notes. Reviewed prior charts and cardiac cath report 2011.      Date: 03/04/2012  Rate: 77  Rhythm: atrial fibrillation  QRS Axis: normal  Intervals: normal  ST/T Wave abnormalities: normal  Conduction Disutrbances:none  Narrative Interpretation:   Old EKG Reviewed: changes noted   Recheck no chest pain. Discussed labs w pt. Given cardiac hx, recent cp, pts on call  cardiologist called to see in ed - spoke w L Kilroy for Dr Allyson Sabal - they will see in ed/admit.       Suzi Roots, MD 03/04/12 [phone]

## 2012-03-04 NOTE — Progress Notes (Signed)
Discussed with Dr Allyson Sabal, will start Xarelto tonight, TEE DCCV cant be done till Thursday. ?plan to do as OP  Corine Shelter PA-C 03/04/2012 3:36 PM

## 2012-03-04 NOTE — Progress Notes (Signed)
Set pt.'s CPAP up at bedside. CPAP is set at 10cmH2O (per home settings) via nasal mask. Pt. Stated that he could put CPAP on himself when ready for bed. Pt. Was made aware to call if he needed assistance.

## 2012-03-05 LAB — HEMOGLOBIN A1C
Hgb A1c MFr Bld: 6.6 % — ABNORMAL HIGH (ref <5.7)
Mean Plasma Glucose: 143 mg/dL — ABNORMAL HIGH (ref <117)

## 2012-03-05 LAB — BASIC METABOLIC PANEL
BUN: 18 mg/dL (ref 6–23)
CO2: 24 mEq/L (ref 19–32)
Calcium: 9.8 mg/dL (ref 8.4–10.5)
Chloride: 101 mEq/L (ref 96–112)
Creatinine, Ser: 1.23 mg/dL (ref 0.50–1.35)
GFR calc Af Amer: 71 mL/min — ABNORMAL LOW (ref >90)
GFR calc non Af Amer: 61 mL/min — ABNORMAL LOW (ref >90)
Glucose, Bld: 112 mg/dL — ABNORMAL HIGH (ref 70–99)
Potassium: 3.5 mEq/L (ref 3.5–5.1)
Sodium: 137 mEq/L (ref 135–145)

## 2012-03-05 LAB — LIPID PANEL
Cholesterol: 179 mg/dL (ref 0–200)
HDL: 33 mg/dL — ABNORMAL LOW (ref >39)
LDL Cholesterol: UNDETERMINED mg/dL (ref 0–99)
Total CHOL/HDL Ratio: 5.4 RATIO
Triglycerides: 483 mg/dL — ABNORMAL HIGH (ref <150)
VLDL: UNDETERMINED mg/dL (ref 0–40)

## 2012-03-05 LAB — CARDIAC PANEL(CRET KIN+CKTOT+MB+TROPI)
CK, MB: 2.8 ng/mL (ref 0.3–4.0)
Relative Index: 2.6 — ABNORMAL HIGH (ref 0.0–2.5)
Total CK: 106 U/L (ref 7–232)
Troponin I: <0.3 ng/mL (ref <0.30)

## 2012-03-05 LAB — CBC
HCT: 42.6 % (ref 39.0–52.0)
Hemoglobin: 15.1 g/dL (ref 13.0–17.0)
MCHC: 35.4 g/dL (ref 30.0–36.0)
RBC: 4.91 MIL/uL (ref 4.22–5.81)
WBC: 8.7 10*3/uL (ref 4.0–10.5)

## 2012-03-05 NOTE — Progress Notes (Signed)
[facility]  Subjective:   Objective: Vital signs in last 24 hours: Temp:  [97.7 F (36.5 C)-98.1 F (36.7 C)] 98 F (36.7 C) (03/25 2343) Pulse Rate:  [40-83] 40  (03/26 0339) Resp:  [11-19] 17  (03/26 0523) BP: (110-164)/(58-97) 117/77 mmHg (03/26 0523) SpO2:  [96 %-99 %] 97 % (03/26 0523) Weight:  [107.956 kg (238 lb)] 107.956 kg (238 lb) (03/25 1141) Last BM Date: 03/03/12  Intake/Output from previous day: 03/25 0701 - 03/26 0700 In: 1485 [P.O.:960; I.V.:525] Out: 1900 [Urine:1900] Intake/Output this shift:    Medications Current Facility-Administered Medications  Medication Dose Route Frequency Provider Last Rate Last Dose  . 0.9 %  sodium chloride infusion   Intravenous Continuous Runell Gess, MD 75 mL/hr at 03/04/12 1900    . acetaminophen (TYLENOL) tablet 650 mg  650 mg Oral Q4H PRN Runell Gess, MD      . aspirin chewable tablet 324 mg  324 mg Oral Once Suzi Roots, MD   243 mg at 03/04/12 0930  . aspirin EC tablet 81 mg  81 mg Oral Daily Eda Paschal [place], [name]      . atorvastatin (LIPITOR) tablet 40 mg  40 mg Oral [address] [place], [name]   40 mg at 03/04/12 1737  . B-complex with vitamin C tablet 1 tablet  1 tablet Oral Daily Eda Paschal [place], [name]   1 tablet at 03/04/12 1737  . cholecalciferol (VITAMIN D) tablet 1,000 Units  1,000 Units Oral QHS Runell Gess, MD   1,000 Units at 03/04/12 2134  . cholecalciferol (VITAMIN D) tablet 2,000 Units  2,000 Units Oral Daily Runell Gess, MD      . clopidogrel (PLAVIX) tablet 75 mg  75 mg Oral Q breakfast Eda Paschal [place], [name]   75 mg at 03/05/12 0806  . diltiazem (CARDIZEM CD) 24 hr capsule 180 mg  180 mg Oral Daily Eda Paschal [place], [name]   180 mg at 03/04/12 1737  . fentaNYL (SUBLIMAZE) 0.05 MG/ML injection           . heparin 2-0.9 UNIT/ML-% infusion           . heparin bolus via infusion 4,000 Units  4,000 Units Intravenous Once Fredrik Rigger, PHARMD   4,000 Units at 03/04/12 1330    . lidocaine (XYLOCAINE) 1 % injection           . losartan (COZAAR) tablet 100 mg  100 mg Oral Daily Eda Paschal [place], [name]   100 mg at 03/04/12 1737  . metoprolol (LOPRESSOR) tablet 50 mg  50 mg Oral BID Eda Paschal [place], [name]      . midazolam (VERSED) 2 MG/2ML injection           . morphine 2 MG/ML injection 1 mg  1 mg Intravenous Q1H PRN Runell Gess, MD      . nitroGLYCERIN (NITROSTAT) SL tablet 0.4 mg  0.4 mg Sublingual Q5 Min x 3 PRN Abelino Derrick, PA      . nitroGLYCERIN (NTG ON-CALL) 0.2 mg/mL injection           . ondansetron (ZOFRAN) injection 4 mg  4 mg Intravenous Q6H PRN Runell Gess, MD      . pantoprazole (PROTONIX) EC tablet 40 mg  40 mg Oral Q1200 Eda Paschal [place], [name]   40 mg at 03/04/12 1737  . rivaroxaban (XARELTO) tablet 20 mg  20 mg Oral Q supper Franky Macho  Leonor Liv, PA   20 mg at 03/04/12 1737  . zolpidem (AMBIEN) tablet 10 mg  10 mg Oral QHS PRN Abelino Derrick, PA      . DISCONTD: 0.9 %  sodium chloride infusion   Intravenous Continuous Suzi Roots, MD 20 mL/hr at 03/04/12 1140 20 mL/hr at 03/04/12 1140  . DISCONTD: 0.9 %  sodium chloride infusion   Intravenous Continuous Eda Paschal [place], [name]      . DISCONTD: 0.9 %  sodium chloride infusion  250 mL Intravenous PRN Abelino Derrick, PA      . DISCONTD: 0.9 %  sodium chloride infusion  250 mL Intravenous PRN Abelino Derrick, PA      . DISCONTD: acetaminophen (TYLENOL) tablet 650 mg  650 mg Oral Q4H PRN Abelino Derrick, PA      . DISCONTD: ALPRAZolam Prudy Feeler) tablet 0.25 mg  0.25 mg Oral BID PRN Abelino Derrick, PA      . DISCONTD: aspirin chewable tablet 324 mg  324 mg Oral [address] [place], [name]      . DISCONTD: aspirin chewable tablet 81 mg  81 mg Oral Daily Runell Gess, MD      . DISCONTD: aspirin EC tablet 81 mg  81 mg Oral Daily Eda Paschal [place], [name]      . DISCONTD: cholecalciferol (VITAMIN D) tablet 1,000-2,000 Units  1,000-2,000 Units Oral See admin instructions Abelino Derrick, PA      . DISCONTD: clopidogrel (PLAVIX) tablet 75 mg   75 mg Oral Daily Eda Paschal [place], [name]      . DISCONTD: clopidogrel (PLAVIX) tablet 75 mg  75 mg Oral Q breakfast Runell Gess, MD      . DISCONTD: diazepam (VALIUM) tablet 5 mg  5 mg Oral On Call Abelino Derrick, [name]      . DISCONTD: heparin ADULT infusion 100 units/mL (25000 units/250 mL)  1,300 Units/hr Intravenous Continuous Fredrik Rigger, PHARMD 13 mL/hr at 03/04/12 1329 1,300 Units/hr at 03/04/12 1329  . DISCONTD: heparin ADULT infusion 100 units/mL (25000 units/250 mL)  1,300 Units/hr Intravenous Continuous Fredrik Rigger, PHARMD      . DISCONTD: nitroGLYCERIN 0.2 mg/mL in dextrose 5 % infusion  3-30 mcg/min Intravenous Titrated Abelino Derrick, PA   3 mcg/min at 03/04/12 1307  . DISCONTD: ondansetron (ZOFRAN) injection 4 mg  4 mg Intravenous Q6H PRN Abelino Derrick, PA      . DISCONTD: rivaroxaban (XARELTO) tablet 20 mg  20 mg Oral Daily Eda Paschal [place], [name]      . DISCONTD: sodium chloride 0.9 % injection 3 mL  3 mL Intravenous Q12H Eda Paschal [place], [name]      . DISCONTD: sodium chloride 0.9 % injection 3 mL  3 mL Intravenous PRN Abelino Derrick, PA      . DISCONTD: sodium chloride 0.9 % injection 3 mL  3 mL Intravenous Q12H Eda Paschal [place], [name]      . DISCONTD: sodium chloride 0.9 % injection 3 mL  3 mL Intravenous PRN Abelino Derrick, PA        PE: Gen NCAT Heart Irreg irreg Lungs Clear Pulses Right groin OK  Lab Results:   Basename 03/05/12 0538 03/04/12 1307 03/04/12 0900  WBC 8.7 7.4 5.9  HGB 15.1 15.3 14.7  HCT 42.6 43.0 42.0  PLT 176 188 175   BMET  Basename 03/05/12 0538 03/04/12 1808 03/04/12 0900  NA 137 139 141  K 3.5 3.3* 3.9  CL 101 102 104  CO2 24 24 25   GLUCOSE 112* 134* 139*  BUN 18 17 19   CREATININE 1.23 1.10 1.20  CALCIUM 9.8 9.8 10.2   PT/INR  Basename 03/04/12 1307  LABPROT 12.6  INR 0.92   Cholesterol  Basename 03/05/12 0538  CHOL 179    Studies/Results: 03/04/12, Left heart cath  HEMODYNAMICS:  AO SYSTOLIC/AO DIASTOLIC: 144/88  LV  SYSTOLIC/LV DIASTOLIC: 140/14  ANIOGRAPHIC RESULTS:  1. Left main; normal  2. LAD; 50% segmental proximal  3 :JWJ:[id number] stent in the mid AV groove there is a high diagonal branch and the ramus distribution distribution that was normal  4. Right coronary artery; anterior take off with a patent stent in the midportion. I was not able to selectively engage the artery with multiple diagnostic catheters.  5 Left ventriculography; RAO left ventriculogram was performed using  25 mL of Visipaque dye at 12 mL/second. The overall LVEF estimated  60 %without wall motion abnormalities  IMPRESSION:Mr. Wasko has patent stents in the circumflex and RCA otherwise noncritical CAD and normal LV function. I his chest pain was related to his atrial fibrillation. An ACT was measured and the sheath was removed. The patient left the lab in stable condition. He'll be re\re heparinized and oral anticoagulant will be started.    Assessment/Plan  Principal Problem:  *Unstable angina Active Problems:  Atrial fibrillation, hx of PAF but he has been in NSR  CAD, prior CFX PCI, last cath [facility] 3/12  Dyslipidemia  HTN (hypertension)  Sleep apnea, on C-Pap  Plan:  S/P left heart cath showing non-critical CAD and normal LVF.  ? IP or OP TEE/DCCV.    LOS: 1 day    HAGER,BRYAN W 03/05/2012 8:47 AM   Agree with note written by Jones Skene [facility]  Coronary anatomy w/o change. Remains in Afib with variable response on BB/CCB. Will D/C plavix since his stents were put in greater then I year ago since last stent was implanted (12/11). Xarelto started yesterday. Will transfer to tele. Plan TEE/DCCV prior to D/C home.  Runell Gess 03/05/2012 8:59 AM

## 2012-03-05 NOTE — Progress Notes (Signed)
RT spoke with pt around 2100 about wearing CPAP. Pt stated he did wear CPAP the previous night, but was uncomfortable and stated the whistling from the exhalation port kept him and his wife awake, and they had a very restless night. Pt refusing CPAP QHS at this time, but stated if he felt like he needed it later tonight would have his nurse call. RT will continue to monitor.

## 2012-03-06 LAB — CBC
MCH: 30.1 pg (ref 26.0–34.0)
MCHC: 34.4 g/dL (ref 30.0–36.0)
MCV: 87.3 fL (ref 78.0–100.0)
Platelets: 178 10*3/uL (ref 150–400)
RBC: 4.79 MIL/uL (ref 4.22–5.81)

## 2012-03-06 NOTE — Progress Notes (Signed)
UR Completed. Simmons, Jameria Bradway F [phone]  

## 2012-03-06 NOTE — Progress Notes (Signed)
At 0153 patient had a 2.05 second pause. Patient was asymptomatic and asleep.  NP on call [name] was notified. VS was bp 121/81, T 97.7, Spo2 99% on RA, R 20, & HR 68 they are charted. Will continue to monitor the patient. Jason Neal

## 2012-03-06 NOTE — Progress Notes (Signed)
Subjective: No chest pain, resting comfortably  Objective: Vital signs in last 24 hours: Temp:  [97.5 F (36.4 C)-98.2 F (36.8 C)] 97.9 F (36.6 C) (03/27 0500) Pulse Rate:  [60-91] 91  (03/27 0939) Resp:  [18-20] 20  (03/27 0500) BP: (107-132)/(67-83) 132/83 mmHg (03/27 0939) SpO2:  [95 %-99 %] 99 % (03/27 0500) Weight change:  Last BM Date: 03/06/12 Intake/Output from previous day:? Amt. 03/26 0701 - 03/27 0700 In: 1560 [P.O.:1560] Out: 5 [Urine:4; Stool:1] Intake/Output this shift:    PE:  General appearance: alert, cooperative, appears stated age, no distress and pleasant mood & affect Neck: no adenopathy, no carotid bruit, no JVD, supple, symmetrical, trachea midline and thyroid not enlarged, symmetric, no tenderness/mass/nodules Lungs: clear to auscultation bilaterally and normal percussion bilaterally Heart: irregularly irregular rhythm, S1, S2 normal, no S3 or S4, no rub and no murmur Abdomen: soft, non-tender; bowel sounds normal; no masses,  no organomegaly Extremities: extremities normal, atraumatic, no cyanosis or edema Pulses: 2+ and symmetric Neurologic: Grossly normal.   Lab Results:  Basename 03/06/12 0620 03/05/12 0538  WBC 7.7 8.7  HGB 14.4 15.1  HCT 41.8 42.6  PLT 178 176   BMET  Basename 03/05/12 0538 03/08/2012 1808  NA 137 139  K 3.5 3.3*  CL 101 102  CO2 24 24  GLUCOSE 112* 134*  BUN 18 17  CREATININE 1.23 1.10  CALCIUM 9.8 9.8    Basename 03/05/12 0033 2012/03/08 1807  TROPONINI <0.30 <0.30    Lab Results  Component Value Date   CHOL 179 03/05/2012   HDL 33* 03/05/2012   LDLCALC UNABLE TO CALCULATE IF TRIGLYCERIDE OVER 400 mg/dL 03/24/2439   TRIG 102* 07/05/3663   CHOLHDL 5.4 03/05/2012   Lab Results  Component Value Date   HGBA1C 6.6* March 08, 2012     Lab Results  Component Value Date   TSH 3.032 08-Mar-2012    Hepatic Function Panel  Basename March 08, 2012 1808  PROT 7.7  ALBUMIN 3.9  AST 27  ALT 22  ALKPHOS 46  BILITOT 0.4    BILIDIR --  IBILI --    Basename 03/05/12 0538  CHOL 179    Studies/Results: Cardiac Cath:  (03-08-12) 1. Left main; normal  2. LAD; 50% segmental proximal  3 :QIH:[id number] stent in the mid AV groove there is a high diagonal branch and the ramus distribution distribution that was normal  4. Right coronary artery; anterior take off with a patent stent in the midportion. 5 Left ventriculography; EF 60 % without wall motion abnormalities   Tele: remains in Afib with CVR  Medications: I have reviewed the patient's current medications.    [name] aspirin EC  81 mg Oral Daily  . atorvastatin  40 mg Oral q1800  . B-complex with vitamin C  1 tablet Oral Daily  . cholecalciferol  1,000 Units Oral QHS  . cholecalciferol  2,000 Units Oral Daily  . diltiazem  180 mg Oral Daily  . losartan  100 mg Oral Daily  . metoprolol  50 mg Oral BID  . pantoprazole  40 mg Oral Q1200  . rivaroxaban  20 mg Oral Q supper  . DISCONTD: clopidogrel  75 mg Oral Q breakfast   Assessment/Plan: Patient Active Problem List  Diagnoses  . Unstable angina - secondary to Afib   . Atrial fibrillation, hx of PAF but Jason Neal has been in NSRv until this admission  . CAD, prior CFX PCI, last cath [facility] 3/12  . Dyslipidemia  . HTN (hypertension)  .  Sleep apnea, on C-Pap   PLAN: Jason Neal presented with new onset atrial fib and chest pain.  Jason Neal underwent cardiac cath: Jason Neal in the circumflex and RCA otherwise noncritical CAD and normal LV function. His chest pain was related to his atrial fibrillation. An ACT was measured and the sheath was removed. Oral anticoagulant was started.  Jason Neal was place on xarelto.  Dr. Hazle Coca last note stated Jason Neal would need TEE DCCV prior to discharge.  Will arrange for Thursday.  03/07/12  At  0830.  Probable discharge home tomorrow. Will have care manager check on cost of xarelto for Jason Neal.    LOS: 2 days   Jason Neal,Jason Neal 03/06/2012, 12:39 PM  I have seen & examined the patient (my exam  above). I agree with the note by Ms. Jason Neal above.    Jason Neal is feeling fine.  Exam is benign with the exception of Afib.   Jason Neal is currently on Xarelto & BB/CCB with controlled rate. On Statin & ASA for CAD with Plavix d/c'd.  Jason Neal, M.D., M.S. [facility] [address] [place], [place]  [address]  [phone] Pager # [phone]  03/06/2012 3:08 PM

## 2012-03-07 ENCOUNTER — Other Ambulatory Visit: Payer: Self-pay

## 2012-03-07 ENCOUNTER — Encounter (HOSPITAL_COMMUNITY): Payer: Self-pay | Admitting: *Deleted

## 2012-03-07 ENCOUNTER — Encounter (HOSPITAL_COMMUNITY)
Admission: EM | Disposition: A | Discharge status: Home / Self Care | Payer: Self-pay | Source: Home / Self Care | Attending: Cardiovascular Disease

## 2012-03-07 ENCOUNTER — Encounter (HOSPITAL_COMMUNITY): Payer: Self-pay | Admitting: Anesthesiology

## 2012-03-07 ENCOUNTER — Inpatient Hospital Stay (HOSPITAL_COMMUNITY): Payer: 59 | Admitting: Anesthesiology

## 2012-03-07 HISTORY — PX: CARDIOVERSION: SHX1299

## 2012-03-07 HISTORY — PX: TEE WITHOUT CARDIOVERSION: SHX5443

## 2012-03-07 LAB — BASIC METABOLIC PANEL
BUN: 21 mg/dL (ref 6–23)
CO2: 22 mEq/L (ref 19–32)
GFR calc non Af Amer: 54 mL/min — ABNORMAL LOW (ref >90)
Glucose, Bld: 129 mg/dL — ABNORMAL HIGH (ref 70–99)
Potassium: 3.8 mEq/L (ref 3.5–5.1)

## 2012-03-07 LAB — CBC
HCT: 43.7 % (ref 39.0–52.0)
Hemoglobin: 14.6 g/dL (ref 13.0–17.0)
MCHC: 33.4 g/dL (ref 30.0–36.0)

## 2012-03-07 SURGERY — ECHOCARDIOGRAM, TRANSESOPHAGEAL
Anesthesia: Monitor Anesthesia Care

## 2012-03-07 MED ORDER — FENTANYL CITRATE 0.05 MG/ML IJ SOLN
INTRAMUSCULAR | Status: DC | PRN
  Administered 2012-03-07 (×2): 25 ug via INTRAVENOUS

## 2012-03-07 MED ORDER — SODIUM CHLORIDE 0.9 % IJ SOLN: 3.0000 mL | INTRAMUSCULAR | Status: DC | PRN

## 2012-03-07 MED ORDER — FENTANYL CITRATE 0.05 MG/ML IJ SOLN
INTRAMUSCULAR | Status: AC
  Filled 2012-03-07: qty 2

## 2012-03-07 MED ORDER — RIVAROXABAN 20 MG PO TABS: 20.0000 mg | ORAL_TABLET | Freq: Every day | ORAL | Status: DC

## 2012-03-07 MED ORDER — BUTAMBEN-TETRACAINE-BENZOCAINE 2-2-14 % EX AERO
INHALATION_SPRAY | CUTANEOUS | Status: DC | PRN
  Administered 2012-03-07: 2 via TOPICAL

## 2012-03-07 MED ORDER — MIDAZOLAM HCL 10 MG/2ML IJ SOLN
INTRAMUSCULAR | Status: AC
  Filled 2012-03-07: qty 2

## 2012-03-07 MED ORDER — SODIUM CHLORIDE 0.9 % IJ SOLN: 3.0000 mL | Freq: Two times a day (BID) | INTRAMUSCULAR | Status: DC

## 2012-03-07 MED ORDER — PROPOFOL 10 MG/ML IV EMUL
INTRAVENOUS | Status: DC | PRN
  Administered 2012-03-07: 40 mg via INTRAVENOUS

## 2012-03-07 MED ORDER — MIDAZOLAM HCL 10 MG/2ML IJ SOLN: 10.0000 mg | Freq: Once | INTRAMUSCULAR | Status: DC

## 2012-03-07 MED ORDER — MIDAZOLAM HCL 10 MG/2ML IJ SOLN
INTRAMUSCULAR | Status: DC | PRN
  Administered 2012-03-07: 1 mg via INTRAVENOUS
  Administered 2012-03-07 (×2): 2 mg via INTRAVENOUS

## 2012-03-07 MED ORDER — SODIUM CHLORIDE 0.9 % IV SOLN
INTRAVENOUS | Status: DC | PRN
  Administered 2012-03-07: 09:00:00 via INTRAVENOUS

## 2012-03-07 MED ORDER — FENTANYL CITRATE 0.05 MG/ML IJ SOLN
250.0000 ug | Freq: Once | INTRAMUSCULAR | Status: DC
Start: 2012-03-07 — End: 2012-03-07

## 2012-03-07 MED ORDER — BENZOCAINE 20 % MT SOLN: 1.0000 "application " | OROMUCOSAL | Status: DC | PRN

## 2012-03-07 MED ORDER — ACETAMINOPHEN 325 MG PO TABS: 650.0000 mg | ORAL_TABLET | ORAL | Status: AC | PRN

## 2012-03-07 MED ORDER — SODIUM CHLORIDE 0.45 % IV SOLN
INTRAVENOUS | Status: DC
  Administered 2012-03-07: 500 mL via INTRAVENOUS

## 2012-03-07 MED ORDER — NITROGLYCERIN 0.4 MG SL SUBL: 0.4000 mg | SUBLINGUAL_TABLET | SUBLINGUAL | Status: DC | PRN

## 2012-03-07 MED ORDER — SODIUM CHLORIDE 0.9 % IV SOLN: 250.0000 mL | INTRAVENOUS | Status: DC | PRN

## 2012-03-07 NOTE — Transfer of Care (Signed)
Immediate Anesthesia Transfer of Care Note  Patient: Jason Neal  Procedure(s) Performed: Procedure(s) (LRB): TRANSESOPHAGEAL ECHOCARDIOGRAM (TEE) (N/A) CARDIOVERSION (N/A)  Patient Location: PACU  Anesthesia Type: MAC  Level of Consciousness: awake and alert   Airway & Oxygen Therapy: Patient Spontanous Breathing and Patient connected to nasal cannula oxygen  Post-op Assessment: Report given to PACU RN and Post -op Vital signs reviewed and stable  Post vital signs: Reviewed and stable  Complications: No apparent anesthesia complications

## 2012-03-07 NOTE — Progress Notes (Signed)
   CARE MANAGEMENT NOTE 03/07/2012  Patient:  Jason Neal, Jason Neal   Account Number:  [id number]  Date Initiated:  03/07/2012  Documentation initiated by:  GRAVES-BIGELOW,Breindel Collier  Subjective/Objective Assessment:   Pt admitted with cp. S/p cath-- showing non-critical CAD and normal LVF.  Plan for home on xarelto. Benefits check complete: Co py cost 45.00 for 30 day supply. Will make pt aware.     Action/Plan:   CM will provide pt with xarelto savings card. CM did call [facility] in [place] @ [phone] and medication is available. No other needs assessed by CM.   Anticipated DC Date:  03/08/2012   Anticipated DC Plan:  HOME/SELF CARE      DC Planning Services  CM consult      Choice offered to / List presented to:             Status of service:  Completed, signed off Medicare Important Message given?   (If response is "NO", the following Medicare IM given date fields will be blank) Date Medicare IM given:   Date Additional Medicare IM given:    Discharge Disposition:  HOME/SELF CARE  Per UR Regulation:    If discussed at Long Length of Stay Meetings, dates discussed:    Comments:

## 2012-03-07 NOTE — Progress Notes (Signed)
[facility]  DAILY PROGRESS NOTE   Subjective:  Feels great, walking halls. Remains in AF with controlled rate.  Objective:  Temp:  [97.6 F (36.4 C)-98.2 F (36.8 C)] 97.6 F (36.4 C) (03/28 0500) Pulse Rate:  [57-91] 61  (03/28 0500) Resp:  [17-20] 18  (03/28 0739) BP: (118-150)/(76-99) 150/99 mmHg (03/28 0739) SpO2:  [95 %-99 %] 98 % (03/28 0828) Weight:  [105.507 kg (232 lb 9.6 oz)] 105.507 kg (232 lb 9.6 oz) (03/28 0500) Weight change:   Intake/Output from previous day: 03/27 0701 - 03/28 0700 In: -  Out: 2 [Urine:2]  Intake/Output from this shift:    Medications: Current Facility-Administered Medications  Medication Dose Route Frequency Provider Last Rate Last Dose  . 0.45 % sodium chloride infusion   Intravenous Continuous Dwana Melena, PA 20 mL/hr at 03/07/12 0749 500 mL at 03/07/12 0749  . 0.9 %  sodium chloride infusion  250 mL Intravenous PRN Dwana Melena, PA      . acetaminophen (TYLENOL) tablet 650 mg  650 mg Oral Q4H PRN Runell Gess, MD      . aspirin EC tablet 81 mg  81 mg Oral Daily Eda Paschal [place], [name]   81 mg at 03/06/12 0939  . atorvastatin (LIPITOR) tablet 40 mg  40 mg Oral q1800 Eda Paschal [place], [name]   40 mg at 03/06/12 1728  . B-complex with vitamin C tablet 1 tablet  1 tablet Oral Daily Eda Paschal [place], [name]   1 tablet at 03/06/12 [phone]  . benzocaine (HURRICAINE) 20 % mouth spray 1 application  1 application Mouth/Throat PRN Dwana Melena, PA      . cholecalciferol (VITAMIN D) tablet 1,000 Units  1,000 Units Oral QHS Runell Gess, MD   1,000 Units at 03/06/12 2152  . cholecalciferol (VITAMIN D) tablet 2,000 Units  2,000 Units Oral Daily Runell Gess, MD   2,000 Units at 03/06/12 0940  . diltiazem (CARDIZEM CD) 24 hr capsule 180 mg  180 mg Oral Daily Eda Paschal [place], [name]   180 mg at 03/06/12 4782  . fentaNYL (SUBLIMAZE) injection 250 mcg  250 mcg Intravenous Once Dwana Melena, PA      . losartan (COZAAR) tablet 100 mg  100  mg Oral Daily Eda Paschal [place], [name]   100 mg at 03/06/12 0939  . metoprolol (LOPRESSOR) tablet 50 mg  50 mg Oral BID Eda Paschal [place], PA   50 mg at 03/06/12 2152  . midazolam (VERSED) injection 10 mg  10 mg Intravenous Once Dwana Melena, PA      . nitroGLYCERIN (NITROSTAT) SL tablet 0.4 mg  0.4 mg Sublingual Q5 Min x 3 PRN Abelino Derrick, PA      . pantoprazole (PROTONIX) EC tablet 40 mg  40 mg Oral Q1200 Abelino Derrick, PA   40 mg at 03/06/12 1241  . rivaroxaban (XARELTO) tablet 20 mg  20 mg Oral Q supper Eda Paschal [place], [name]   20 mg at 03/06/12 1728  . sodium chloride 0.9 % injection 3 mL  3 mL Intravenous Q12H Dwana Melena, PA      . sodium chloride 0.9 % injection 3 mL  3 mL Intravenous PRN Dwana Melena, PA      . zolpidem (AMBIEN) tablet 10 mg  10 mg Oral QHS PRN Abelino Derrick, PA        Physical Exam: General appearance: alert, cooperative, appears stated  age, no distress and pleasant mood & affect  Neck: no adenopathy, no carotid bruit, no JVD, supple, symmetrical, trachea midline and thyroid not enlarged, symmetric, no tenderness/mass/nodules  Lungs: clear to auscultation bilaterally and normal percussion bilaterally  Heart: irregularly irregular rhythm, S1, S2 normal, no S3 or S4, no rub and no murmur  Abdomen: soft, non-tender; bowel sounds normal; no masses, no organomegaly  Extremities: extremities normal, atraumatic, no cyanosis or edema  Pulses: 2+ and symmetric  Neurologic: Grossly normal.   Lab Results: Results for orders placed during the hospital encounter of 03/04/12 (from the past 48 hour(s))  CBC     Status: Normal   Collection Time   03/06/12  6:20 AM      Component Value Range Comment   WBC 7.7  4.0 - 10.5 (K/uL)    RBC 4.79  4.22 - 5.81 (MIL/uL)    Hemoglobin 14.4  13.0 - 17.0 (g/dL)    HCT 40.9  81.1 - 91.4 (%)    MCV 87.3  78.0 - 100.0 (fL)    MCH 30.1  26.0 - 34.0 (pg)    MCHC 34.4  30.0 - 36.0 (g/dL)    RDW 78.2  95.6 - 21.3 (%)    Platelets 178  150 - 400  (K/uL)   CBC     Status: Normal   Collection Time   03/07/12  7:10 AM      Component Value Range Comment   WBC 6.7  4.0 - 10.5 (K/uL)    RBC 4.95  4.22 - 5.81 (MIL/uL)    Hemoglobin 14.6  13.0 - 17.0 (g/dL)    HCT 08.6  57.8 - 46.9 (%)    MCV 88.3  78.0 - 100.0 (fL)    MCH 29.5  26.0 - 34.0 (pg)    MCHC 33.4  30.0 - 36.0 (g/dL)    RDW 62.9  52.8 - 41.3 (%)    Platelets 203  150 - 400 (K/uL)   BASIC METABOLIC PANEL     Status: Abnormal   Collection Time   03/07/12  7:10 AM      Component Value Range Comment   Sodium 140  135 - 145 (mEq/L)    Potassium 3.8  3.5 - 5.1 (mEq/L)    Chloride 103  96 - 112 (mEq/L)    CO2 22  19 - 32 (mEq/L)    Glucose, Bld 129 (*) 70 - 99 (mg/dL)    BUN 21  6 - 23 (mg/dL)    Creatinine, Ser 2.44 (*) 0.50 - 1.35 (mg/dL)    Calcium 01.0  8.4 - 10.5 (mg/dL)    GFR calc non Af Amer 54 (*) >90 (mL/min)    GFR calc Af Amer 63 (*) >90 (mL/min)   PROTIME-INR     Status: Abnormal   Collection Time   03/07/12  7:10 AM      Component Value Range Comment   Prothrombin Time 17.6 (*) 11.6 - 15.2 (seconds)    INR 1.42  0.00 - 1.49      Imaging: No results found.  Assessment:  1. Principal Problem: 2.  *Unstable angina 3. Active Problems: 4.  Atrial fibrillation, hx of PAF but he has been in NSR 5.  CAD, prior CFX PCI, last cath [facility] 3/12 6.  Dyslipidemia 7.  HTN (hypertension) 8.  Sleep apnea, on C-Pap 9.   Plan:  1. Risks and benefits of DCCV reviewed w patient. He agrees to proceed. 2. Continue Xarelto indefinitely, no interruption  for at least 30 days after CV. 3. F/U w Dr. Allyson Sabal.  Time Spent Directly with Patient:  30 minutes  Length of Stay:  LOS: 3 days    Ariele Vidrio 03/07/2012, 8:44 AM

## 2012-03-07 NOTE — Anesthesia Procedure Notes (Signed)
Performed by: Anaily Ashbaugh       

## 2012-03-07 NOTE — Op Note (Signed)
INDICATIONS: atrial fibrillation precardioversion  PROCEDURE:   Informed consent was obtained prior to the procedure. The risks, benefits and alternatives for the procedure were discussed and the patient comprehended these risks.  Risks include, but are not limited to, cough, sore throat, vomiting, nausea, somnolence, esophageal and stomach trauma or perforation, bleeding, low blood pressure, aspiration, pneumonia, infection, trauma to the teeth and death.    After a procedural time-out, the oropharynx was anesthetized with 20% benzocaine spray. The patient was given 5 mg versed and 50 mcg fentanyl for moderate sedation.   The transesophageal probe was inserted in the esophagus and stomach without difficulty and multiple views were obtained.  The patient was kept under observation until the patient left the procedure room.  The patient left the procedure room in stable condition.   Agitated microbubble saline contrast was not administered.  COMPLICATIONS:    There were no immediate complications.  FINDINGS:  There was no evidence of left atrial thrombus. The facial appendage size was normal and there were excellent emptying velocities. There is no evidence of spontaneous echo contrast. Left atrial diameter is normal. The left ventricular systolic function is normal. No significant valvular abnormalities were seen.  RECOMMENDATIONS:    Proceed with cardioversion  Time Spent Directly with the Patient:  45 minutes   Jason Neal 03/07/2012, 8:51 AM

## 2012-03-07 NOTE — Progress Notes (Signed)
Echocardiogram Echocardiogram Transesophageal has been performed.  Jorje Guild [facility] 03/07/2012, 9:20 AM

## 2012-03-07 NOTE — Addendum Note (Signed)
Addendum  created 03/07/12 1241 by Judie Petit, MD   Modules edited:Anesthesia Review and Sign Navigator Section, Charting, Inpatient Notes, Notes Section

## 2012-03-07 NOTE — Anesthesia Preprocedure Evaluation (Addendum)
Anesthesia Evaluation  Patient identified by MRN, date of birth, ID band Patient awake    Reviewed: Allergy & Precautions, H&P , NPO status , Patient's Chart, lab work & pertinent test results, reviewed documented beta blocker date and time   Airway Mallampati: II      Dental   Pulmonary sleep apnea and Continuous Positive Airway Pressure Ventilation ,  breath sounds clear to auscultation        Cardiovascular hypertension, Pt. on home beta blockers and Pt. on medications + angina + CAD + dysrhythmias Atrial Fibrillation Rhythm:Regular Rate:Normal     Neuro/Psych    GI/Hepatic Neg liver ROS, GERD-  ,  Endo/Other    Renal/GU negative Renal ROS     Musculoskeletal   Abdominal   Peds  Hematology negative hematology ROS (+)   Anesthesia Other Findings   Reproductive/Obstetrics                         Anesthesia Physical Anesthesia Plan  ASA: III  Anesthesia Plan: MAC   Post-op Pain Management:    Induction: Intravenous  Airway Management Planned: Mask  Additional Equipment:   Intra-op Plan:   Post-operative Plan:   Informed Consent:   Plan Discussed with: CRNA, Anesthesiologist and Surgeon  Anesthesia Plan Comments:        Anesthesia Quick Evaluation

## 2012-03-07 NOTE — Addendum Note (Signed)
Addendum  created 03/07/12 1240 by Judie Petit, MD   Modules edited:Anesthesia Review and Sign Navigator Section, Charting, Inpatient Notes, Notes Section

## 2012-03-07 NOTE — Anesthesia Postprocedure Evaluation (Signed)
  Anesthesia Post-op Note  Patient: Jason Neal  Procedure(s) Performed: Procedure(s) (LRB): TRANSESOPHAGEAL ECHOCARDIOGRAM (TEE) (N/A) CARDIOVERSION (N/A)  Patient Location: PACU  Anesthesia Type: MAC  Level of Consciousness: awake and alert   Airway and Oxygen Therapy: Patient Spontanous Breathing and Patient connected to nasal cannula oxygen  Post-op Pain: none  Post-op Assessment: Post-op Vital signs reviewed, Patient's Cardiovascular Status Stable, Respiratory Function Stable, Patent Airway and No signs of Nausea or vomiting  Post-op Vital Signs: Reviewed and stable  Complications: No apparent anesthesia complications

## 2012-03-07 NOTE — Anesthesia Postprocedure Evaluation (Signed)
  Anesthesia Post-op Note  Patient: Jason Neal  Procedure(s) Performed: Procedure(s) (LRB): TRANSESOPHAGEAL ECHOCARDIOGRAM (TEE) (N/A) CARDIOVERSION (N/A)  Patient Location: PACU and Short Stay  Anesthesia Type: MAC  Level of Consciousness: awake  Airway and Oxygen Therapy: Patient Spontanous Breathing  Post-op Pain: mild  Post-op Assessment: Post-op Vital signs reviewed  Post-op Vital Signs: stable  Complications: No apparent anesthesia complications

## 2012-03-07 NOTE — Discharge Instructions (Signed)
Atrial Fibrillation Your caregiver has diagnosed you with atrial fibrillation (AFib). The heart normally beats very regularly; AFib is a type of irregular heartbeat. The heart rate may be faster or slower than normal. This can prevent your heart from pumping as well as it should. AFib can be constant (chronic) or intermittent (paroxysmal). CAUSES  Atrial fibrillation may be caused by:  Heart disease, including heart attack, coronary artery disease, heart failure, diseases of the heart valves, and others.   Blood clot in the lungs (pulmonary embolism).   Pneumonia or other infections.   Chronic lung disease.   Thyroid disease.   Toxins. These include alcohol, some medications (such as decongestant medications or diet pills), and caffeine.  In some people, no cause for AFib can be found. This is referred to as Lone Atrial Fibrillation. SYMPTOMS   Palpitations or a fluttering in your chest.   A vague sense of chest discomfort.   Shortness of breath.   Sudden onset of lightheadedness or weakness.  Sometimes, the first sign of AFib can be a complication of the condition. This could be a stroke or heart failure. DIAGNOSIS  Your description of your condition may make your caregiver suspicious of atrial fibrillation. Your caregiver will examine your pulse to determine if fibrillation is present. An EKG (electrocardiogram) will confirm the diagnosis. Further testing may help determine what caused you to have atrial fibrillation. This may include chest x-ray, echocardiogram, blood tests, or CT scans. PREVENTION  If you have previously had atrial fibrillation, your caregiver may advise you to avoid substances known to cause the condition (such as stimulant medications, and possibly caffeine or alcohol). You may be advised to use medications to prevent recurrence. Proper treatment of any underlying condition is important to help prevent recurrence. PROGNOSIS  Atrial fibrillation does tend to  become a chronic condition over time. It can cause significant complications (see below). Atrial fibrillation is not usually immediately life-threatening, but it can shorten your life expectancy. This seems to be worse in women. If you have lone atrial fibrillation and are under 60 years old, the risk of complications is very low, and life expectancy is not shortened. RISKS AND COMPLICATIONS  Complications of atrial fibrillation can include stroke, chest pain, and heart failure. Your caregiver will recommend treatments for the atrial fibrillation, as well as for any underlying conditions, to help minimize risk of complications. TREATMENT  Treatment for AFib is divided into several categories:  Treatment of any underlying condition.   Converting you out of AFib into a regular (sinus) rhythm.   Controlling rapid heart rate.   Prevention of blood clots and stroke.  Medications and procedures are available to convert your atrial fibrillation to sinus rhythm. However, recent studies have shown that this may not offer you any advantage, and cardiac experts are continuing research and debate on this topic. More important is controlling your rapid heartbeat. The rapid heartbeat causes more symptoms, and places strain on your heart. Your caregiver will advise you on the use of medications that can control your heart rate. Atrial fibrillation is a strong stroke risk. You can lessen this risk by taking blood thinning medications such as Coumadin (warfarin), or sometimes aspirin. These medications need close monitoring by your caregiver. Over-medication can cause bleeding. Too little medication may not protect against stroke. HOME CARE INSTRUCTIONS   If your caregiver prescribed medicine to make your heartbeat more normally, take as directed.   If blood thinners were prescribed by your caregiver, take EXACTLY as directed.     Perform blood tests EXACTLY as directed.   Quit smoking. Smoking increases your  cardiac and lung (pulmonary) risks.   DO NOT drink alcohol.   DO NOT drink caffeinated drinks (e.g. coffee, soda, chocolate, and leaf teas). You may drink decaffeinated coffee, soda or tea.   If you are overweight, you should choose a reduced calorie diet to lose weight. Please see a registered dietitian if you need more information about healthy weight loss. DO NOT USE DIET PILLS as they may aggravate heart problems.   If you have other heart problems that are causing AFib, you may need to eat a low salt, fat, and cholesterol diet. Your caregiver will tell you if this is necessary.   Exercise every day to improve your physical fitness. Stay active unless advised otherwise.   If your caregiver has given you a follow-up appointment, it is very important to keep that appointment. Not keeping the appointment could result in heart failure or stroke. If there is any problem keeping the appointment, you must call back to this facility for assistance.  SEEK MEDICAL CARE IF:  You notice a change in the rate, rhythm or strength of your heartbeat.   You develop an infection or any other change in your overall health status.  SEEK IMMEDIATE MEDICAL CARE IF:   You develop chest pain, abdominal pain, sweating, weakness or feel sick to your stomach (nausea).   You develop shortness of breath.   You develop swollen feet and ankles.   You develop dizziness, numbness, or weakness of your face or limbs, or any change in vision or speech.  MAKE SURE YOU:   Understand these instructions.   Will watch your condition.   Will get help right away if you are not doing well or get worse.  Document Released: 11/27/2005 Document Revised: 11/16/2011 Document Reviewed: 07/01/2008 [facility] Patient Information 2012 [place], [place]. Other Instructions: Heart Healthy diet.  No heavy lifting for 2 days.  Call [facility] if any bleeding, swelling or drainage at cath site.  May  shower, no tub baths for 48 hours for groin sticks.   Take 1 NTG, under your tongue, while sitting.  If no relief of pain may repeat NTG, one tab every 5 minutes up to 3 tablets total over 15 minutes.  If no relief CALL 911.  If you have dizziness/lightheadness  while taking NTG, stop taking and call 911.     Do not stop Xarelto- to prevent clots with atrial fib.  Call the office if bleeding or complications.       Continue your CPap

## 2012-03-07 NOTE — CV Procedure (Signed)
Procedure: Electrical Cardioversion Indications:  Atrial Fibrillation  Procedure Details:  Consent: Risks of procedure as well as the alternatives and risks of each were explained to the (patient/caregiver).  Consent for procedure obtained.  Time Out: Verified patient identification, verified procedure, site/side was marked, verified correct patient position, special equipment/implants available, medications/allergies/relevent history reviewed, required imaging and test results available.  Performed  Patient placed on cardiac monitor, pulse oximetry, supplemental oxygen as necessary.  Sedation given: Benzodiazepines and and fentanyl and propofol Pacer pads placed anterior and posterior chest.  Cardioverted 1 time(s).  Cardioverted at 150J.  Evaluation: Findings: Post procedure EKG shows: sinus bradycardia Complications: None Patient did tolerate procedure well.  Time Spent Directly with the Patient:  45 minutes   Jason Neal 03/07/2012, 8:52 AM

## 2012-03-07 NOTE — Preoperative (Signed)
Beta Blockers   Reason not to administer Beta Blockers:Not Applicable 

## 2012-03-11 ENCOUNTER — Encounter (HOSPITAL_COMMUNITY): Payer: Self-pay | Admitting: Cardiovascular Disease

## 2012-03-12 NOTE — Discharge Summary (Signed)
Physician Discharge Summary  Patient ID: Jason Neal MRN: [id number] DOB/AGE: November 01, 1951 61 y.o.  Admit date: 03/04/2012 Discharge date: 03/07/2012  Discharge Diagnoses:  Principal Problem:  *Unstable angina, negative MI, stable coronary arteries by cardiac cath.  Angina thought to be secondary to atrial fib Active Problems:  Atrial fibrillation, hx of PAF but he has been in NSR, prior to admit 03/04/12, successful cardioversion  03/07/12 to SR/SB  CAD, prior CFX PCI, last cath [facility] 3/12: Cardiac cath 03/04/12 with patent stents- stable CAD  Dyslipidemia  HTN (hypertension)  Sleep apnea, on C-Pap  Procedures: 03/04/2012 Combined left heart cath by Dr. Ashley Akin stents in the circumflex and right coronary artery otherwise noncritical coronary artery disease and normal LV function.  03/07/2012 transesophageal echocardiogram by Dr. Benjaman Kindler evidence of left atrial thrombus, No evidence of spontaneous echo contrast.  03/07/2012 cardioversion by Dr. Royann Shivers successful patient discharged in sinus bradycardia/sinus rhythm.   Discharged Condition: good  Hospital Course: 61 y/o with a history of CAD and PAF. His last cath 3/12 showed patent CFX stent with a smooth 50-60% narrowing after this, treated medically. He just saw Dr Allyson Sabal in the clinic 02/26/12 and was doing well. He woke up 03/04/2012 with pressure in his chest. He could tell his HR was irregular. He has had PAF in the past but has not been on Coumadin. No recent angina. 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 In the ER he was in AF with VR 80s with vague chest pressure that radiates to his posterior shoulders. He was admitted for further eval.  He underwent cardiac catheterization on 03/04/2012 due to concern that atrial fibrillation was caused by ischemia. His EF was normal at 60% he had patent stents in the circumflex artery and the right coronary artery otherwise  noncritical coronary disease. Dr. Allyson Sabal and then felt his chest pain was related to his A. Fib. He was heparinized and placed on oral anti-coagulation.  Plans for cardioversion were also discussed. Xarelto was started 03/25/20113.    Patient continued to be stable, but remaining in atrial fibrillation.  Patient was on beta blocker and calcium channel blocker as well. His Plavix was DC'd .  On 03/07/2012 patient underwent TEE would Dr. Royann Shivers and then proceeded with cardioversion which was successful.  Patient was cardioverted into sinus bradycardia sinus rhythm.  Post cardioversion he was stable and ready for discharge home.  Ambulated without complications. He will follow up as an outpatient.  Consults: None  Significant Diagnostic Studies:  Labs prior to discharge sodium 140 potassium 3.8 chloride 103 CO2 22 BUN 21 creatinine 1.36 calcium 10.1 glucose 129.  Cardiac enzymes are negative CK 106 MB 2.8 troponin I < 0.30. Total cholesterol 179 triglycerides 483 HDL 33 and unable to determine LDL secondary to elevated triglycerides.  Hemoglobin 14.6 hematocrit 43.7 WBC 6.7 and platelets are 203  TSH 3.032, hemoglobin A1c 6.6  Two-view chest x-ray without acute findings.  EKG atrial fibrillation with no acute EKG changes.  At discharge sinus rhythm. Discharge Exam: Blood pressure 111/73, pulse 61, temperature 97.6 F (36.4 C), temperature source Oral, resp. rate 15, height 6' (1.829 m), weight 105.507 kg (232 lb 9.6 oz), SpO2 96.00%.   General appearance: alert, cooperative, appears stated age, no distress and pleasant mood & affect  Neck: no adenopathy, no carotid bruit, no JVD, supple, symmetrical, trachea midline and thyroid not enlarged, symmetric, no tenderness/mass/nodules  Lungs: clear to auscultation bilaterally and normal  percussion bilaterally  Heart: irregularly irregular rhythm, S1, S2 normal, no S3 or S4, no rub and no murmur  Abdomen: soft, non-tender; bowel sounds normal; no  masses, no organomegaly  Extremities: extremities normal, atraumatic, no cyanosis or edema  Pulses: 2+ and symmetric  Neurologic: Grossly normal  Disposition: 01-Home or Self Care   Medication List  As of 03/12/2012 12:14 PM   STOP taking these medications         clopidogrel 75 MG tablet         TAKE these medications         acetaminophen 325 MG tablet   Commonly known as: TYLENOL   Take 2 tablets (650 mg total) by mouth every 4 (four) hours as needed for pain.      aspirin EC 81 MG tablet   Take 81 mg by mouth daily.      atorvastatin 40 MG tablet   Commonly known as: LIPITOR   Take 40 mg by mouth daily.      B-complex with vitamin C tablet   Take 1 tablet by mouth daily.      cholecalciferol 1000 UNITS tablet   Commonly known as: VITAMIN D   Take 1,000-2,000 Units by mouth See admin instructions. 2000units in am, 1000 units in pm      diltiazem 180 MG 24 hr capsule   Commonly known as: CARDIZEM CD   Take 180 mg by mouth daily.      esomeprazole 40 MG capsule   Commonly known as: NEXIUM   Take 40 mg by mouth daily before breakfast.      losartan 100 MG tablet   Commonly known as: COZAAR   Take 100 mg by mouth daily.      metoprolol 50 MG tablet   Commonly known as: LOPRESSOR   Take 50 mg by mouth 2 (two) times daily.      nitroGLYCERIN 0.4 MG SL tablet   Commonly known as: NITROSTAT   Place 1 tablet (0.4 mg total) under the tongue every 5 (five) minutes x 3 doses as needed for chest pain.      Rivaroxaban 20 MG Tabs   Take 20 mg by mouth daily with supper.           Follow-up Information    Follow up with Dennison Mascot, MD.      Follow up with Runell Gess, MD on 03/21/2012. (at 11:00 am)    Contact information:   [address] [place] [address] [phone]        Discharge instructions: Heart Healthy diet.  No heavy lifting for 2 days.  Call [facility] if any bleeding,  swelling or drainage at cath site.  May shower, no tub baths for 48 hours for groin sticks.   Take 1 NTG, under your tongue, while sitting.  If no relief of pain may repeat NTG, one tab every 5 minutes up to 3 tablets total over 15 minutes.  If no relief CALL 911.  If you have dizziness/lightheadness  while taking NTG, stop taking and call 911.     Do not stop Xarelto- to prevent clots with atrial fib.  Call the office if bleeding or complications.       Continue your CPap  Signed: Shakeena Kafer R 03/12/2012, 12:14 PM

## 2012-11-25 ENCOUNTER — Other Ambulatory Visit (HOSPITAL_COMMUNITY): Payer: Self-pay | Admitting: Cardiovascular Disease

## 2012-11-25 DIAGNOSIS — I6529 Occlusion and stenosis of unspecified carotid artery: Secondary | ICD-10-CM

## 2013-03-13 ENCOUNTER — Inpatient Hospital Stay (HOSPITAL_COMMUNITY): Admission: RE | Admit: 2013-03-13 | Payer: 59 | Source: Ambulatory Visit

## 2013-03-13 ENCOUNTER — Encounter (HOSPITAL_COMMUNITY): Payer: 59

## 2013-03-16 ENCOUNTER — Encounter: Payer: Self-pay | Admitting: *Deleted

## 2013-03-17 ENCOUNTER — Encounter: Payer: Self-pay | Admitting: Cardiovascular Disease

## 2013-05-15 ENCOUNTER — Encounter: Payer: Self-pay | Admitting: Cardiovascular Disease

## 2013-05-15 ENCOUNTER — Ambulatory Visit (INDEPENDENT_AMBULATORY_CARE_PROVIDER_SITE_OTHER): Payer: 59 | Admitting: Cardiovascular Disease

## 2013-05-15 VITALS — BP 130/82 | HR 54 | Ht 72.0 in | Wt 236.9 lb

## 2013-05-15 DIAGNOSIS — I10 Essential (primary) hypertension: Secondary | ICD-10-CM

## 2013-05-15 DIAGNOSIS — E785 Hyperlipidemia, unspecified: Secondary | ICD-10-CM

## 2013-05-15 DIAGNOSIS — I2510 Atherosclerotic heart disease of native coronary artery without angina pectoris: Secondary | ICD-10-CM

## 2013-05-15 DIAGNOSIS — I4891 Unspecified atrial fibrillation: Secondary | ICD-10-CM

## 2013-05-15 DIAGNOSIS — G4730 Sleep apnea, unspecified: Secondary | ICD-10-CM

## 2013-05-15 NOTE — Assessment & Plan Note (Signed)
On CPAP which he benefits from 

## 2013-05-15 NOTE — Assessment & Plan Note (Signed)
Under good control of her medications 

## 2013-05-15 NOTE — Progress Notes (Signed)
05/15/2013 Jason Neal   Dec 23, 1950  [id number]  Primary Physician MORRISEY,LEMONT, MD Primary Cardiologist: Runell Gess MD Jason Neal   HPI:  The patient is a 62 year old mildly overweight fit appearing married Caucasian male father of 2, grandfather of 3 grandchildren whose wife is also a patient of mine. I last saw him 6 months ago. He has a history of CAD with stenting of his circumflex and RCA in the past. He was last cathed March 02, 2012 revealing patent stents with 40-50% mid AV groove circumflex and proximal LAD stenosis with normal LV function. His other problems include history of PAF with RVR, cardioverted to sinus rhythm on Xarelto. He has hypertension, hyperlipidemia and obstructive sleep apnea on CPAP. 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 He has also considerably changed his diet to the point where his lipid profile recently checked last month which was slightly worse than his prior lipid profile. He asystole barbecue meal he had eaten before.      Current Outpatient Prescriptions  Medication Sig Dispense Refill  . aspirin EC 81 MG tablet Take 81 mg by mouth 2 (two) times daily.       [name] atorvastatin (LIPITOR) 40 MG tablet Take 40 mg by mouth daily.      . B Complex-C (B-COMPLEX WITH VITAMIN C) tablet Take 1 tablet by mouth daily.      . chlorthalidone (HYGROTON) 25 MG tablet Take 12.5 mg by mouth daily.      . cholecalciferol (VITAMIN D) 1000 UNITS tablet Take 1,000-2,000 Units by mouth See admin instructions. 2000units in am, 1000 units in pm      . diltiazem (CARDIZEM CD) 180 MG 24 hr capsule Take 180 mg by mouth daily.      [name] esomeprazole (NEXIUM) 40 MG capsule Take 40 mg by mouth daily before breakfast.      . losartan (COZAAR) 100 MG tablet Take 100 mg by mouth daily.      . metoprolol (LOPRESSOR) 50 MG tablet Take 50 mg by mouth 2  (two) times daily.      [name] omega-3 acid ethyl esters (LOVAZA) 1 G capsule Take 1 g by mouth 2 (two) times daily.      . rivaroxaban 20 MG TABS Take 20 mg by mouth daily with supper.  30 tablet  11  . ACCU-CHEK AVIVA PLUS test strip       . Lancets (ACCU-CHEK MULTICLIX) lancets       . nitroGLYCERIN (NITROSTAT) 0.4 MG SL tablet Place 1 tablet (0.4 mg total) under the tongue every 5 (five) minutes x 3 doses as needed for chest pain.  25 tablet  4   No current facility-administered medications for this visit.    Allergies  Allergen Reactions  . Angiotensin Receptor Blockers Cough    History   Social History  . Marital Status: Married    Spouse Name: N/A    Number of Children: N/A  . Years of Education: N/A   Occupational History  . Not on file.   Social History Main Topics  . Smoking status: Never Smoker   . Smokeless tobacco: Not on file  . Alcohol Use: No  . Drug Use: Not on file  . Sexually Active: Not on file   Other Topics Concern  . Not on file   Social History Narrative  . No narrative on file  Review of Systems: General: negative for chills, fever, night sweats or weight changes.  Cardiovascular: negative for chest pain, dyspnea on exertion, edema, orthopnea, palpitations, paroxysmal nocturnal dyspnea or shortness of breath Dermatological: negative for rash Respiratory: negative for cough or wheezing Urologic: negative for hematuria Abdominal: negative for nausea, vomiting, diarrhea, bright red blood per rectum, melena, or hematemesis Neurologic: negative for visual changes, syncope, or dizziness All other systems reviewed and are otherwise negative except as noted above.    Blood pressure 130/82, pulse 54, height 6' (1.829 m), weight 236 lb 14.4 oz (107.457 kg).  General appearance: alert and no distress Neck: no adenopathy, no carotid bruit, no JVD, supple, symmetrical, trachea midline and thyroid not enlarged, symmetric, no  tenderness/mass/nodules Lungs: clear to auscultation bilaterally Heart: regular rate and rhythm, S1, S2 normal, no murmur, click, rub or gallop Extremities: extremities normal, atraumatic, no cyanosis or edema  EKG sinus bradycardia at 54 without ST or T wave changes  ASSESSMENT AND PLAN:   CAD, prior CFX PCI, last cath [facility] 3/12: Cardiac cath 03/04/12 with patent stents- stable CAD He denies chest pain or shortness of breath and is on appropriate medications  Dyslipidemia On atorvastatin 40 mg a day with recent bloodwork which showed total cholesterol 196, LDL of 81 HDL 36. Sugars are level was 297. He says he had a barbecue meal the evening before his blood work.  HTN (hypertension) Under good control of her medications  Sleep apnea, on C-Pap On CPAP which he benefits from  Atrial fibrillation, hx of PAF but he has been in NSR, prior to admit 03/04/12, successful cardioversion  03/07/12 to SR/SB On Xarelto maintaining sinus rhythm      Runell Gess MD [facility], [facility] 05/15/2013 9:22 AM

## 2013-05-15 NOTE — Assessment & Plan Note (Signed)
On Xarelto maintaining sinus rhythm

## 2013-05-15 NOTE — Assessment & Plan Note (Signed)
He denies chest pain or shortness of breath and is on appropriate medications

## 2013-05-15 NOTE — Patient Instructions (Addendum)
Your physician wants you to follow-up in: 6 MONTHS WITH AN EXTENDER AND 12 MONTHS WITH DR BERRY. You will receive a reminder letter in the mail two months in advance. If you don't receive a letter, please call our office to schedule the follow-up appointment.  

## 2013-05-15 NOTE — Assessment & Plan Note (Signed)
On atorvastatin 40 mg a day with recent bloodwork which showed total cholesterol 196, LDL of 81 HDL 36. Sugars are level was 297. He says he had a barbecue meal the evening before his blood work.

## 2013-11-20 ENCOUNTER — Encounter: Payer: Self-pay | Admitting: Cardiology

## 2013-11-20 ENCOUNTER — Ambulatory Visit (INDEPENDENT_AMBULATORY_CARE_PROVIDER_SITE_OTHER): Payer: 59 | Admitting: Cardiology

## 2013-11-20 ENCOUNTER — Ambulatory Visit: Payer: 59 | Admitting: Cardiology

## 2013-11-20 VITALS — BP 140/80 | HR 66 | Ht 72.0 in | Wt 235.6 lb

## 2013-11-20 DIAGNOSIS — N183 Chronic kidney disease, stage 3 unspecified: Secondary | ICD-10-CM

## 2013-11-20 DIAGNOSIS — I10 Essential (primary) hypertension: Secondary | ICD-10-CM

## 2013-11-20 DIAGNOSIS — I4891 Unspecified atrial fibrillation: Secondary | ICD-10-CM

## 2013-11-20 DIAGNOSIS — E1129 Type 2 diabetes mellitus with other diabetic kidney complication: Secondary | ICD-10-CM

## 2013-11-20 DIAGNOSIS — E1121 Type 2 diabetes mellitus with diabetic nephropathy: Secondary | ICD-10-CM

## 2013-11-20 DIAGNOSIS — E785 Hyperlipidemia, unspecified: Secondary | ICD-10-CM

## 2013-11-20 DIAGNOSIS — N058 Unspecified nephritic syndrome with other morphologic changes: Secondary | ICD-10-CM

## 2013-11-20 DIAGNOSIS — E1165 Type 2 diabetes mellitus with hyperglycemia: Secondary | ICD-10-CM | POA: Insufficient documentation

## 2013-11-20 DIAGNOSIS — G4730 Sleep apnea, unspecified: Secondary | ICD-10-CM

## 2013-11-20 DIAGNOSIS — I2510 Atherosclerotic heart disease of native coronary artery without angina pectoris: Secondary | ICD-10-CM

## 2013-11-20 DIAGNOSIS — Z7901 Long term (current) use of anticoagulants: Secondary | ICD-10-CM | POA: Insufficient documentation

## 2013-11-20 MED ORDER — ASPIRIN EC 81 MG PO TBEC: 81.0000 mg | DELAYED_RELEASE_TABLET | Freq: Every day | ORAL | Status: DC

## 2013-11-20 NOTE — Assessment & Plan Note (Signed)
He has an appointment with a nephrologist in [place]

## 2013-11-20 NOTE — Assessment & Plan Note (Signed)
Followed by primary M.D. 

## 2013-11-20 NOTE — Assessment & Plan Note (Signed)
No angina 

## 2013-11-20 NOTE — Assessment & Plan Note (Signed)
On Xarelto, ASA decreased to 81 mg daily

## 2013-11-20 NOTE — Progress Notes (Signed)
11/20/2013 Jason Neal   [birth year]  [id number]  Primary Physicia MORRISEY,LEMONT, MD Primary Cardiologist: Dr Allyson Sabal  HPI:  Mr Agrusa is a pleasant 62 y/o from [place]. He is retired from [employer] after 37 yrs. He is married and his wife still works at [employer]. His daughter is an Charity fundraiser and a Merchandiser, retail at the [employer], his son is a Chartered loss adjuster. The pt has a history of CAD. He had a CFX and RCA PCI with DES in December 2011 by Dr Juliann Pares. His last cath was March 2013 by Dr Allyson Sabal when he presented with chest pain and was found to be in rapid AF. Cath then showed 40-50% LAD and 40-50% CFX prior to the stent. The CFX/RCA stents were patent. His LVF was NL. He was cardioverted and has not had recurrent PAF. He is on chronic anticoagulation. 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 He denies any exertional chest pain or dyspnea. His primary care MD is following his HTN and lipids. He also checks his sugars and adjusts his diet based on this. The last Hgb A1c I have is from March 2013 and was 6.6. He has CRI with Scr 1.3-1.6 range. He tells me he has been referred to a nephrologist in [facility].    Current Outpatient Prescriptions  Medication Sig Dispense Refill  . ACCU-CHEK AVIVA PLUS test strip       . atorvastatin (LIPITOR) 40 MG tablet Take 40 mg by mouth daily.      . B Complex-C (B-COMPLEX WITH VITAMIN C) tablet Take 1 tablet by mouth daily.      . chlorthalidone (HYGROTON) 25 MG tablet Take 12.5 mg by mouth daily.      . cholecalciferol (VITAMIN D) 1000 UNITS tablet Take 1,000-2,000 Units by mouth See admin instructions. 2000units in am, 1000 units in pm      . diltiazem (CARDIZEM CD) 180 MG 24 hr capsule Take 180 mg by mouth daily.      [name] esomeprazole (NEXIUM) 40 MG capsule Take 40 mg by mouth daily before breakfast.      . Lancets (ACCU-CHEK MULTICLIX) lancets       . losartan (COZAAR) 100 MG tablet Take 100 mg by mouth daily.      .  metoprolol (LOPRESSOR) 50 MG tablet Take 50 mg by mouth 2 (two) times daily.      . nitroGLYCERIN (NITROSTAT) 0.4 MG SL tablet Place 1 tablet (0.4 mg total) under the tongue every 5 (five) minutes x 3 doses as needed for chest pain.  25 tablet  4  . omega-3 acid ethyl esters (LOVAZA) 1 G capsule Take 1 g by mouth 2 (two) times daily.      . rivaroxaban 20 MG TABS Take 20 mg by mouth daily with supper.  30 tablet  11  . aspirin EC 81 MG tablet Take 1 tablet (81 mg total) by mouth daily.  90 tablet  3  . hydrALAZINE (APRESOLINE) 25 MG tablet Take 25 mg by mouth 2 (two) times daily.       No current facility-administered medications for this visit.    Allergies  Allergen Reactions  . Angiotensin Receptor Blockers Cough    History   Social History  . Marital Status: Married    Spouse Name: N/A    Number of Children: N/A  . Years of Education: N/A   Occupational History  . Not on file.   Social  History Main Topics  . Smoking status: Never Smoker   . Smokeless tobacco: Not on file  . Alcohol Use: No  . Drug Use: Not on file  . Sexual Activity: Not on file   Other Topics Concern  . Not on file   Social History Narrative  . No narrative on file     Review of Systems: General: negative for chills, fever, night sweats or weight changes.  Cardiovascular: negative for chest pain, dyspnea on exertion, edema, orthopnea, palpitations, paroxysmal nocturnal dyspnea or shortness of breath Dermatological: negative for rash Respiratory: negative for cough or wheezing Urologic: negative for hematuria Abdominal: negative for nausea, vomiting, diarrhea, bright red blood per rectum, melena, or hematemesis Neurologic: negative for visual changes, syncope, or dizziness Profuse sweating- chronic All other systems reviewed and are otherwise negative except as noted above.    Blood pressure 140/80, pulse 66, height 6' (1.829 m), weight 235 lb 9.6 oz (106.867 kg).  General appearance: alert,  cooperative and no distress Neck: no carotid bruit and no JVD Lungs: clear to auscultation bilaterally Heart: regular rate and rhythm Abdomen: soft, non-tender; bowel sounds normal; no masses,  no organomegaly Extremities: extremities normal, atraumatic, no cyanosis or edema Pulses: 2+ and symmetric Skin: Skin color, texture, turgor normal. 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 He will follow up with Dr Allyson Sabal in 6 months  [facility] 11/20/2013 9:33 AM

## 2013-11-20 NOTE — Assessment & Plan Note (Signed)
Diet controlled.  

## 2013-11-20 NOTE — Patient Instructions (Addendum)
Your physician recommends that you schedule a follow-up appointment in: 6 months with Dr Allyson Sabal Decrease ASA to 81 mg daily

## 2013-11-20 NOTE — Assessment & Plan Note (Signed)
Compliant 

## 2013-11-20 NOTE — Assessment & Plan Note (Signed)
No AF since LOV

## 2014-05-19 ENCOUNTER — Encounter: Payer: Self-pay | Admitting: Cardiovascular Disease

## 2014-05-19 ENCOUNTER — Ambulatory Visit (INDEPENDENT_AMBULATORY_CARE_PROVIDER_SITE_OTHER): Payer: 59 | Admitting: Cardiovascular Disease

## 2014-05-19 VITALS — BP 138/82 | HR 51 | Ht 72.0 in | Wt 225.0 lb

## 2014-05-19 DIAGNOSIS — I10 Essential (primary) hypertension: Secondary | ICD-10-CM

## 2014-05-19 DIAGNOSIS — I4891 Unspecified atrial fibrillation: Secondary | ICD-10-CM

## 2014-05-19 DIAGNOSIS — G4730 Sleep apnea, unspecified: Secondary | ICD-10-CM

## 2014-05-19 DIAGNOSIS — I2510 Atherosclerotic heart disease of native coronary artery without angina pectoris: Secondary | ICD-10-CM

## 2014-05-19 DIAGNOSIS — E785 Hyperlipidemia, unspecified: Secondary | ICD-10-CM

## 2014-05-19 NOTE — Patient Instructions (Signed)
Your physician recommends that you schedule a follow-up appointment in 6 months with an extender - Luke Kilroy, PA-C. Dr Berry wants you to follow-up in 12 months.  You will receive a reminder letter in the mail two months in advance. If you don't receive a letter, please call our office to schedule the follow-up appointment. 

## 2014-05-19 NOTE — Assessment & Plan Note (Signed)
Well-controlled on current medications 

## 2014-05-19 NOTE — Assessment & Plan Note (Signed)
History of CAD status post multiple vessel intervention in the past. Denies chest pain or shortness of breath.

## 2014-05-19 NOTE — Assessment & Plan Note (Signed)
No clinical episodes of recurrence. Currently in sinus rhythm/sinus bradycardia. On oral anticoagulation

## 2014-05-19 NOTE — Progress Notes (Signed)
05/19/2014 Jason Neal   December 13, 1950  [id number]  Primary Physician [place], MD Primary Cardiologist: Lorretta Harp MD Renae Gloss   HPI:   The patient is a 63 year old mildly overweight fit appearing married Caucasian male father of 2, grandfather of 3 grandchildren whose wife is also a patient of mine. I last saw him 17months ago.he saw Jason Ransom PA-C 6 months ago. He has a history of CAD with stenting of his circumflex and RCA in the past. He was last cathed March 02, 2012 revealing patent stents with 40-50% mid AV groove circumflex and proximal LAD stenosis with normal LV function. His other problems include history of PAF with RVR, cardioverted to sinus rhythm on Xarelto. He has hypertension, hyperlipidemia and obstructive sleep apnea on CPAP. 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 His weight had gotten as high as 242 pounds as a result of dietary indiscretion with resulting increase in his triglyceride level to about 600. Yesterday since lost 17 pounds with modification of his diet and exercise. He denies chest pain or shortness of breath.    Current Outpatient Prescriptions  Medication Sig Dispense Refill  . ACCU-CHEK AVIVA PLUS test strip       . aspirin EC 81 MG tablet Take 1 tablet (81 mg total) by mouth daily.  90 tablet  3  . atorvastatin (LIPITOR) 40 MG tablet Take 40 mg by mouth daily.      . B Complex-C (B-COMPLEX WITH VITAMIN C) tablet Take 1 tablet by mouth daily.      . cholecalciferol (VITAMIN D) 1000 UNITS tablet Take 1,000-2,000 Units by mouth See admin instructions. 2000units in am, 1000 units in pm      . diltiazem (CARDIZEM CD) 180 MG 24 hr capsule Take 180 mg by mouth daily.      [name] esomeprazole (NEXIUM) 40 MG capsule Take 40 mg by mouth daily before breakfast.      . hydrALAZINE (APRESOLINE) 50 MG tablet Take 50 mg by mouth 2  (two) times daily.      . Lancets (ACCU-CHEK MULTICLIX) lancets       . losartan (COZAAR) 100 MG tablet Take 100 mg by mouth daily.      . metoprolol succinate (TOPROL-XL) 50 MG 24 hr tablet Take 50 mg by mouth 2 (two) times daily. Take with or immediately following a meal.      . omega-3 acid ethyl esters (LOVAZA) 1 G capsule Take 1 g by mouth 2 (two) times daily.      . rivaroxaban 20 MG TABS Take 20 mg by mouth daily with supper.  30 tablet  11  . nitroGLYCERIN (NITROSTAT) 0.4 MG SL tablet Place 1 tablet (0.4 mg total) under the tongue every 5 (five) minutes x 3 doses as needed for chest pain.  25 tablet  4   No current facility-administered medications for this visit.    Allergies  Allergen Reactions  . Angiotensin Receptor Blockers Cough  . Demeclocycline Other (See Comments)    History   Social History  . Marital Status: Married    Spouse Name: N/A    Number of Children: N/A  . Years of Education: N/A   Occupational History  . Not on file.   Social History Main Topics  . Smoking status: Never Smoker   . Smokeless tobacco: Not on file  . Alcohol  Use: No  . Drug Use: Not on file  . Sexual Activity: Not on file   Other Topics Concern  . Not on file   Social History Narrative  . No narrative on file     Review of Systems: General: negative for chills, fever, night sweats or weight changes.  Cardiovascular: negative for chest pain, dyspnea on exertion, edema, orthopnea, palpitations, paroxysmal nocturnal dyspnea or shortness of breath Dermatological: negative for rash Respiratory: negative for cough or wheezing Urologic: negative for hematuria Abdominal: negative for nausea, vomiting, diarrhea, bright red blood per rectum, melena, or hematemesis Neurologic: negative for visual changes, syncope, or dizziness All other systems reviewed and are otherwise negative except as noted above.    Blood pressure 138/82, pulse 51, height 6' (1.829 m), weight 225 lb (102.059  kg).  General appearance: alert and no distress Neck: no adenopathy, no carotid bruit, no JVD, supple, symmetrical, trachea midline and thyroid not enlarged, symmetric, no tenderness/mass/nodules Lungs: clear to auscultation bilaterally Heart: regular rate and rhythm, S1, S2 normal, no murmur, click, rub or gallop Extremities: extremities normal, atraumatic, no cyanosis or edema  EKG sinus bradycardia at 51 without ST or T wave changes  ASSESSMENT AND PLAN:   Sleep apnea, on C-Pap Continues on CPAP with good clinical results  HTN (hypertension) Well-controlled on current medications  Dyslipidemia On statin drug was markedly elevated triglycerides admittedly related to dietary indiscretion which has since corrected. Followed by his PCP  PAF successful cardioversion  03/07/12 to SR/SB No clinical episodes of recurrence. Currently in sinus rhythm/sinus bradycardia. On oral anticoagulation  CAD, prior CFX/RCA DES Dec 2011, cath 03/04/12  patent stents- stable CAD History of CAD status post multiple vessel intervention in the past. Denies chest pain or shortness of breath.      Lorretta Harp MD FACP,FACC,FAHA, [facility] 05/19/2014 7:59 AM

## 2014-05-19 NOTE — Assessment & Plan Note (Signed)
Continues on CPAP with good clinical results

## 2014-05-19 NOTE — Assessment & Plan Note (Signed)
On statin drug was markedly elevated triglycerides admittedly related to dietary indiscretion which has since corrected. Followed by his PCP

## 2014-10-28 ENCOUNTER — Ambulatory Visit: Payer: 59 | Admitting: Cardiology

## 2014-11-04 ENCOUNTER — Ambulatory Visit: Payer: 59 | Admitting: Cardiovascular Disease

## 2014-11-17 ENCOUNTER — Encounter: Payer: Self-pay | Admitting: Physician Assistant

## 2014-11-17 ENCOUNTER — Ambulatory Visit (INDEPENDENT_AMBULATORY_CARE_PROVIDER_SITE_OTHER): Payer: Medicare Other | Admitting: Physician Assistant

## 2014-11-17 VITALS — BP 140/70 | HR 62 | Ht 72.0 in | Wt 235.8 lb

## 2014-11-17 DIAGNOSIS — N183 Chronic kidney disease, stage 3 unspecified: Secondary | ICD-10-CM

## 2014-11-17 DIAGNOSIS — I2510 Atherosclerotic heart disease of native coronary artery without angina pectoris: Secondary | ICD-10-CM

## 2014-11-17 DIAGNOSIS — E785 Hyperlipidemia, unspecified: Secondary | ICD-10-CM

## 2014-11-17 DIAGNOSIS — N189 Chronic kidney disease, unspecified: Secondary | ICD-10-CM

## 2014-11-17 DIAGNOSIS — Z79899 Other long term (current) drug therapy: Secondary | ICD-10-CM

## 2014-11-17 DIAGNOSIS — E1121 Type 2 diabetes mellitus with diabetic nephropathy: Secondary | ICD-10-CM

## 2014-11-17 DIAGNOSIS — Z7901 Long term (current) use of anticoagulants: Secondary | ICD-10-CM

## 2014-11-17 MED ORDER — FENOFIBRATE 54 MG PO TABS: 54.0000 mg | ORAL_TABLET | Freq: Every day | ORAL | Status: DC

## 2014-11-17 NOTE — Progress Notes (Signed)
Patient ID: DEVRON Neal, male   DOB: [birth year], 63 y.o.   MRN: [id number]    Date:  11/17/2014   ID:  Jason Neal, DOB 06-30-1951, MRN [id number]  PCP:  Ashok Norris, MD  Primary Cardiologist:  Gwenlyn Found     History of Present Illness: Jason Neal is a 63 y.o. male, mildly overweight fit appearing married Caucasian male father of 2, grandfather of 3 grandchildren whose wife is also a patient of Dr. Gwenlyn Found. He last saw him 6 months ago.  He has a history of CAD with stenting of his circumflex and RCA in the past. He was last cathed March 02, 2012 revealing patent stents with 40-50% mid AV groove circumflex and proximal LAD stenosis with normal LV function. His other problems include history of PAF with RVR, cardioverted to sinus rhythm on Xarelto. He has hypertension, hyperlipidemia and obstructive sleep apnea on CPAP. 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 His weight had gotten as high as 242 pounds as a result of dietary indiscretion with resulting increase in his triglyceride level to about 600. His most recent triglyceride TSH is within normal limits. CBC is within normal limits.  Patient presents today for 6 month evaluation. He's been hunting and playing [facility] since he has white hair and beard.  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  He currently denies nausea, vomiting, fever, chest pain, shortness of breath, orthopnea, dizziness, PND, cough, congestion, abdominal pain, hematochezia, melena, lower extremity edema, claudication.  Wt Readings from Last 3 Encounters:  11/17/14 235 lb 12.8 oz (106.958 kg)  05/19/14 225 lb (102.059 kg)  11/20/13 235 lb 9.6 oz (106.867 kg)     Past Medical History  Diagnosis Date  . Hypercholesteremia   . Hypertension   . Dyslipidemia   . PAF (paroxysmal atrial fibrillation) March 2013  . Sleep apnea      on C-Pap  . DJD (degenerative joint disease)     c-spine  . GERD (gastroesophageal reflux disease)   . Carotid bruit 03/14/11    CAROTID DUPLEX DOPPLER-Right bulb; demonstrated a mild amount of fibrous plaque w/o evidence of a significant diameter reduction, dissection or any other vascular abnormality.;Left ICA;-proximal, mid and distal segments of the internal carotid artery normal patency.mid and distal segments demonstrating moderate tortuosity..Mildly abnormal carotid duplex scan.  [name] CAD (coronary artery disease)     prior CFX/RCA PCI 2011, cath OK 3/13  . Chronic renal insufficiency, stage III (moderate)   . Type 2 diabetes mellitus with chronic kidney disease     diet controlled    Current Outpatient Prescriptions  Medication Sig Dispense Refill  . ACCU-CHEK AVIVA PLUS test strip     . aspirin EC 81 MG tablet Take 1 tablet (81 mg total) by mouth daily. 90 tablet 3  . atorvastatin (LIPITOR) 40 MG tablet Take 40 mg by mouth daily.    . B Complex-C (B-COMPLEX WITH VITAMIN C) tablet Take 1 tablet by mouth daily.    . cholecalciferol (VITAMIN D) 1000 UNITS tablet Take 1,000-2,000 Units by mouth See admin instructions. 2000units in am, 1000 units in pm    . diltiazem (CARDIZEM CD) 180 MG 24 hr capsule Take 180 mg by mouth daily.    [name] esomeprazole (NEXIUM) 40 MG capsule Take 40 mg by mouth daily before breakfast.    .  hydrALAZINE (APRESOLINE) 50 MG tablet Take 50 mg by mouth 2 (two) times daily.    . Lancets (ACCU-CHEK MULTICLIX) lancets     . losartan (COZAAR) 100 MG tablet Take 100 mg by mouth daily.    . metoprolol succinate (TOPROL-XL) 50 MG 24 hr tablet Take 50 mg by mouth 2 (two) times daily. Take with or immediately following a meal.    . omega-3 acid ethyl esters (LOVAZA) 1 G capsule Take 1 g by mouth 2 (two) times daily.    . rivaroxaban 20 MG TABS Take 20 mg by mouth daily with supper. 30 tablet 11  . fenofibrate 54 MG tablet Take 1 tablet (54 mg total) by mouth daily. 90  tablet 3  . nitroGLYCERIN (NITROSTAT) 0.4 MG SL tablet Place 1 tablet (0.4 mg total) under the tongue every 5 (five) minutes x 3 doses as needed for chest pain. 25 tablet 4   No current facility-administered medications for this visit.    Allergies:    Allergies  Allergen Reactions  . Angiotensin Receptor Blockers Cough  . Demeclocycline Other (See Comments)    Social History:  The patient  reports that he has never smoked. He does not have any smokeless tobacco history on file. He reports that he does not drink alcohol.   Family history:  History reviewed. No pertinent family history.  ROS:  Please see the history of present illness.  All other systems reviewed and negative.   PHYSICAL EXAM: VS:  BP 140/70 mmHg  Pulse 62  Ht 6' (1.829 m)  Wt 235 lb 12.8 oz (106.958 kg)  BMI 31.97 kg/m2 Well nourished, well developed, in no acute distress HEENT: Pupils are equal round react to light accommodation extraocular movements are intact.  Neck: no JVDNo cervical lymphadenopathy. Cardiac: Regular rate and rhythm with 1/6 murmurs rubs or gallops. Lungs:  clear to auscultation bilaterally, no wheezing, rhonchi or rales Abd: soft, nontender, positive bowel sounds all quadrants, no hepatosplenomegaly Ext: no lower extremity edema.  2+ radial and dorsalis pedis pulses. Skin: warm and dry Neuro:  Grossly normal  EKG:  Normal sinus rhythm rate 62 bpm.   ASSESSMENT AND PLAN:  Problem List Items Addressed This Visit    CAD, prior CFX/RCA DES Dec 2011, cath 03/04/12  patent stents- stable CAD (Chronic)    No complaints of angina    Relevant Medications      fenofibrate tablet   Other Relevant Orders      EKG 12-Lead   Chronic anticoagulation - Primary (Chronic)   Chronic renal insufficiency, stage III (moderate)- he sees a nephrologist in [place]   Dyslipidemia (Chronic)    Patient's triglycerides continue to be elevated. Most current is 653. He does not particularly remember been on  fenofibrate. With his chronic KD it is probably best to avoid fenofibrate. I did recommend he add flax and/or chia seed to his diet.  Both are high in omega-3 fatty acids.  Also told him he needs to be on a extremely low carbohydrate diet. We'll recheck his cholesterol prior to his next visit.    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

## 2014-11-17 NOTE — Assessment & Plan Note (Signed)
Mildly elevated today no changes.

## 2014-11-17 NOTE — Assessment & Plan Note (Addendum)
Patient's triglycerides continue to be elevated. Most current is 653. He does not particularly remember been on fenofibrate. With his chronic KD it is probably best to avoid fenofibrate. I did recommend he add flax and/or chia seed to his diet.  Both are high in omega-3 fatty acids.  Also told him he needs to be on a extremely low carbohydrate diet. We'll recheck his cholesterol prior to his next visit.

## 2014-11-17 NOTE — Assessment & Plan Note (Signed)
No complaints of angina. 

## 2014-11-17 NOTE — Patient Instructions (Addendum)
START taking Fenofibrate 54 MG once daily.  Please follow up with Dr. Gwenlyn Found in 6 months.  Please complete your blood work in 4-6 months, prior to your appointment with Dr. Gwenlyn Found. You must be FASTING when you complete this blood work.

## 2014-11-18 ENCOUNTER — Encounter: Payer: Self-pay | Admitting: Cardiovascular Disease

## 2014-11-19 ENCOUNTER — Encounter (HOSPITAL_COMMUNITY): Payer: Self-pay | Admitting: Cardiovascular Disease

## 2015-02-24 ENCOUNTER — Telehealth: Payer: Self-pay | Admitting: Cardiovascular Disease

## 2015-02-24 NOTE — Telephone Encounter (Signed)
Pt. States the hydralazine is causing her to have irregular heart beats . Please advise

## 2015-02-24 NOTE — Telephone Encounter (Signed)
Pt is on 4 blood pressure medicin.The Hydralazine 50 mg is having side effects,irregular heart beats.Please call to advise.

## 2015-02-26 NOTE — Telephone Encounter (Signed)
I'm not aware that this is a side effect. He can follow-up with [facility] for further recommendations for medication changes and/or titration

## 2015-03-02 NOTE — Telephone Encounter (Signed)
Left message for pt to call.

## 2015-03-10 NOTE — Telephone Encounter (Signed)
Spoke with pt, he has a lot of issues with his sinuses. He has been taking the medicine twice daily and he is going to discuss medications with dr berry at his follow up appt.

## 2015-05-19 ENCOUNTER — Encounter: Payer: Self-pay | Admitting: Cardiovascular Disease

## 2015-05-19 ENCOUNTER — Ambulatory Visit (INDEPENDENT_AMBULATORY_CARE_PROVIDER_SITE_OTHER): Payer: Medicare Other | Admitting: Cardiovascular Disease

## 2015-05-19 VITALS — BP 146/82 | HR 62 | Ht 72.0 in | Wt 235.0 lb

## 2015-05-19 DIAGNOSIS — I2510 Atherosclerotic heart disease of native coronary artery without angina pectoris: Secondary | ICD-10-CM | POA: Diagnosis not present

## 2015-05-19 DIAGNOSIS — I10 Essential (primary) hypertension: Secondary | ICD-10-CM | POA: Diagnosis not present

## 2015-05-19 DIAGNOSIS — E785 Hyperlipidemia, unspecified: Secondary | ICD-10-CM | POA: Diagnosis not present

## 2015-05-19 DIAGNOSIS — I480 Paroxysmal atrial fibrillation: Secondary | ICD-10-CM | POA: Diagnosis not present

## 2015-05-19 DIAGNOSIS — I2583 Coronary atherosclerosis due to lipid rich plaque: Secondary | ICD-10-CM

## 2015-05-19 NOTE — Assessment & Plan Note (Signed)
History of hyperlipidemia on atorvastatin 40 mg a day followed by his PCP 

## 2015-05-19 NOTE — Assessment & Plan Note (Signed)
History of PAF maintaining sinus rhythm on beta blocker and Xarelto oral anticoagulation.

## 2015-05-19 NOTE — Assessment & Plan Note (Addendum)
History of hypertension with blood pressure measured at 146/82. He is on diltiazem, hydralazine, losartan, and  metoprolol. Continue current meds at current dosing

## 2015-05-19 NOTE — Progress Notes (Signed)
05/19/2015 Jason Neal   [birth year]  [id number]  Primary Physician Ashok Norris, MD Primary Cardiologist: Lorretta Harp MD Renae Gloss   HPI:   The patient is a 64 year old mildly overweight fit appearing married Caucasian male father of 2, grandfather of 3 grandchildren whose wife is also a patient of mine.his wife apparently has recently retired. He is taking care of his mother who has dementia as well. I last saw him 3months ago.he saw Kerin Ransom PA-C 6 months ago. He has a history of CAD with stenting of his circumflex and RCA in the past. He was last cathed March 02, 2012 revealing patent stents with 40-50% mid AV groove circumflex and proximal LAD stenosis with normal LV function. His other problems include history of PAF with RVR, cardioverted to sinus rhythm on Xarelto. He has hypertension, hyperlipidemia and obstructive sleep apnea on CPAP. 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 His weight had gotten as high as 242 pounds as a result of dietary indiscretion with resulting increase in his triglyceride level to about 600. Since I saw him he ago he has remained asymptomatic. He has a new primary care physician [facility] he'll check his blood work later this summer. 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  Current Outpatient Prescriptions  Medication Sig Dispense Refill  . ACCU-CHEK AVIVA PLUS test strip     . aspirin EC 81 MG tablet Take 1 tablet (81 mg total) by mouth daily. 90 tablet 3  . atorvastatin (LIPITOR) 40 MG tablet Take 40 mg by mouth daily.    . B Complex-C (B-COMPLEX WITH VITAMIN C) tablet Take 1 tablet by mouth daily.    . cholecalciferol (VITAMIN D) 1000 UNITS tablet Take 1,000-2,000 Units by mouth See admin instructions. 2000units in am, 1000 units in pm    . diltiazem (CARDIZEM CD) 180 MG 24 hr capsule  Take 180 mg by mouth daily.    [name] esomeprazole (NEXIUM) 40 MG capsule Take 40 mg by mouth daily before breakfast.    . hydrALAZINE (APRESOLINE) 50 MG tablet Take 50 mg by mouth 2 (two) times daily.    . Lancets (ACCU-CHEK MULTICLIX) lancets     . losartan (COZAAR) 100 MG tablet Take 100 mg by mouth daily.    . metoprolol succinate (TOPROL-XL) 50 MG 24 hr tablet Take 50 mg by mouth daily. Take with or immediately following a meal.    . omega-3 acid ethyl esters (LOVAZA) 1 G capsule Take 1 g by mouth 2 (two) times daily.    . rivaroxaban 20 MG TABS Take 20 mg by mouth daily with supper. 30 tablet 11  . nitroGLYCERIN (NITROSTAT) 0.4 MG SL tablet Place 1 tablet (0.4 mg total) under the tongue every 5 (five) minutes x 3 doses as needed for chest pain. 25 tablet 4   No current facility-administered medications for this visit.    Allergies  Allergen Reactions  . Angiotensin Receptor Blockers Cough  . Demeclocycline Other (See Comments)    History   Social History  . Marital Status: Married    Spouse Name: N/A  . Number of Children: N/A  . Years of Education: N/A   Occupational History  . Not on file.   Social History Main Topics  . Smoking status: Never Smoker   . Smokeless  tobacco: Not on file  . Alcohol Use: No  . Drug Use: Not on file  . Sexual Activity: Not on file   Other Topics Concern  . Not on file   Social History Narrative     Review of Systems: General: negative for chills, fever, night sweats or weight changes.  Cardiovascular: negative for chest pain, dyspnea on exertion, edema, orthopnea, palpitations, paroxysmal nocturnal dyspnea or shortness of breath Dermatological: negative for rash Respiratory: negative for cough or wheezing Urologic: negative for hematuria Abdominal: negative for nausea, vomiting, diarrhea, bright red blood per rectum, melena, or hematemesis Neurologic: negative for visual changes, syncope, or dizziness All other systems reviewed and are  otherwise negative except as noted above.    Blood pressure 146/82, pulse 62, height 6' (1.829 m), weight 235 lb (106.595 kg).  General appearance: alert and no distress Neck: no adenopathy, no JVD, supple, symmetrical, trachea midline, thyroid not enlarged, symmetric, no tenderness/mass/nodules and soft bilateral carotid bruits Lungs: clear to auscultation bilaterally Heart: regular rate and rhythm, S1, S2 normal, no murmur, click, rub or gallop Extremities: extremities normal, atraumatic, no cyanosis or edema  EKG normal sinus rhythm at 62 without ST or T-wave changes. I personally reviewed this EKG  ASSESSMENT AND PLAN:   PAF successful cardioversion  03/07/12 to SR/SB History of PAF maintaining sinus rhythm on beta blocker and Xarelto oral anticoagulation.  HTN (hypertension) History of hypertension with blood pressure measured at 146/82. He is on diltiazem, hydralazine, losartan, and  metoprolol. Continue current meds at current dosing  Dyslipidemia History of hyperlipidemia on atorvastatin 40 mg a day followed by his PCP  CAD, prior CFX/RCA DES Dec 2011, cath 03/04/12  patent stents- stable CAD History of CAD status post circumflex and RCA stenting in December 2011 with cath performed 03/02/2012 revealing patent stents with 40-50% mid AV groove circumflex and proximal LAD disease with normal LV function. He denies chest pain or shortness of breath      Lorretta Harp MD [facility], [facility] 05/19/2015 11:10 AM

## 2015-05-19 NOTE — Patient Instructions (Signed)
Your physician wants you to follow-up in: 1 year with Dr Gwenlyn Found. You will receive a reminder letter in the mail two months in advance. If you don't receive a letter, please call our office to schedule the follow-up appointment.   Fax [phone]

## 2015-05-19 NOTE — Assessment & Plan Note (Signed)
History of CAD status post circumflex and RCA stenting in December 2011 with cath performed 03/02/2012 revealing patent stents with 40-50% mid AV groove circumflex and proximal LAD disease with normal LV function. He denies chest pain or shortness of breath

## 2015-05-27 ENCOUNTER — Other Ambulatory Visit: Payer: Self-pay | Admitting: Family Medicine

## 2015-06-28 ENCOUNTER — Other Ambulatory Visit: Payer: Self-pay | Admitting: Family Medicine

## 2015-08-27 ENCOUNTER — Other Ambulatory Visit: Payer: Self-pay | Admitting: Family Medicine

## 2015-08-30 ENCOUNTER — Other Ambulatory Visit: Payer: Self-pay | Admitting: Family Medicine

## 2015-09-12 ENCOUNTER — Other Ambulatory Visit: Payer: Self-pay | Admitting: Family Medicine

## 2015-12-12 DIAGNOSIS — C801 Malignant (primary) neoplasm, unspecified: Secondary | ICD-10-CM

## 2015-12-12 HISTORY — DX: Malignant (primary) neoplasm, unspecified: C80.1

## 2016-05-10 ENCOUNTER — Ambulatory Visit (INDEPENDENT_AMBULATORY_CARE_PROVIDER_SITE_OTHER): Payer: Medicare Other | Admitting: Cardiovascular Disease

## 2016-05-10 ENCOUNTER — Encounter: Payer: Self-pay | Admitting: Cardiovascular Disease

## 2016-05-10 VITALS — BP 155/79 | HR 60 | Ht 72.0 in | Wt 232.0 lb

## 2016-05-10 DIAGNOSIS — G4730 Sleep apnea, unspecified: Secondary | ICD-10-CM

## 2016-05-10 DIAGNOSIS — I10 Essential (primary) hypertension: Secondary | ICD-10-CM | POA: Diagnosis not present

## 2016-05-10 DIAGNOSIS — I480 Paroxysmal atrial fibrillation: Secondary | ICD-10-CM | POA: Diagnosis not present

## 2016-05-10 NOTE — Patient Instructions (Signed)

## 2016-05-10 NOTE — Assessment & Plan Note (Signed)
History of obstructive sleep apnea on c Pap which he benefits from.

## 2016-05-10 NOTE — Assessment & Plan Note (Signed)
history of hypertension with blood pressure measured at 155/79. He is on carvedilol, diltiazem, hydralazine and losartan. Continue current meds at current dosing

## 2016-05-10 NOTE — Assessment & Plan Note (Signed)
History of hyperlipidemia on statin therapyas well as Lovaza followed by his PCP.

## 2016-05-10 NOTE — Assessment & Plan Note (Signed)
History of CAD status post stenting of the circumflex and RCA in the past. He was cathed 03/02/12 revealing patent stents with 40-50% mid AV groove stenosis in proximal LAD stenosis with normal LV function.He denies chest pain or shortness of breath.

## 2016-05-10 NOTE — Progress Notes (Signed)
05/10/2016 Jason Neal   14-Mar-1951  [id number]  Primary Physician Kirk Ruths., MD Primary Cardiologist: Lorretta Harp MD Renae Gloss  HPI:  The patient is a 65 year old mildly overweight fit appearing married Caucasian male father of 2, grandfather of 3 grandchildren whose wife is also a patient of mine.his wife apparently has recently retired. He is taking care of his mother who has dementia as well. I last saw him in the office 05/19/15.[name] He has a history of CAD with stenting of his circumflex and RCA in the past. He was last cathed March 02, 2012 revealing patent stents with 40-50% mid AV groove circumflex and proximal LAD stenosis with normal LV function. His other problems include history of PAF with RVR, cardioverted to sinus rhythm on Xarelto. He has hypertension, hyperlipidemia and obstructive sleep apnea on CPAP. 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 His weight had gotten as high as 242 pounds as a result of dietary indiscretion with resulting increase in his triglyceride level to about 600. Since I saw him he ago he has remained asymptomatic.  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   Current Outpatient Prescriptions  Medication Sig Dispense Refill  . ACCU-CHEK AVIVA PLUS test strip     . aspirin EC 81 MG tablet Take 1 tablet (81 mg total) by mouth daily. 90 tablet 3  . atorvastatin (LIPITOR) 40 MG tablet Take 40 mg by mouth daily.    . B Complex-C (B-COMPLEX WITH VITAMIN C) tablet Take 1 tablet by mouth daily.    . carvedilol (COREG) 6.25 MG tablet Take 1 tablet by mouth 2 (two) times daily.    . cholecalciferol (VITAMIN D) 1000 UNITS tablet Take 1,000-2,000 Units by mouth See admin instructions. 2000units in am, 1000 units in pm    . diltiazem (CARDIZEM CD) 180 MG 24 hr capsule Take 180 mg by mouth daily.    .  fluticasone (FLONASE) 50 MCG/ACT nasal spray Place 1 spray into the nose as needed.    . hydrALAZINE (APRESOLINE) 50 MG tablet Take 50 mg by mouth 2 (two) times daily.    . Lancets (ACCU-CHEK MULTICLIX) lancets     . losartan (COZAAR) 100 MG tablet TAKE 1 TABLET DAILY 90 tablet 0  . nitroGLYCERIN (NITROSTAT) 0.4 MG SL tablet Place 1 tablet (0.4 mg total) under the tongue every 5 (five) minutes x 3 doses as needed for chest pain. 25 tablet 4  . omega-3 acid ethyl esters (LOVAZA) 1 G capsule Take 1 g by mouth 2 (two) times daily.    . pantoprazole (PROTONIX) 40 MG tablet Take 1 tablet by mouth daily.    Alveda Reasons 20 MG TABS tablet TAKE 1 TABLET DAILY 30 tablet 0   No current facility-administered medications for this visit.    Allergies  Allergen Reactions  . Angiotensin Receptor Blockers Cough  . Demeclocycline Other (See Comments)    Social History   Social History  . Marital Status: Married    Spouse Name: N/A  . Number of Children: N/A  . Years of Education: N/A   Occupational History  . Not on file.   Social History Main Topics  . Smoking status: Never Smoker   . Smokeless tobacco: Not on file  . Alcohol Use: No  . Drug Use: Not on file  .  Sexual Activity: Not on file   Other Topics Concern  . Not on file   Social History Narrative     Review of Systems: General: negative for chills, fever, night sweats or weight changes.  Cardiovascular: negative for chest pain, dyspnea on exertion, edema, orthopnea, palpitations, paroxysmal nocturnal dyspnea or shortness of breath Dermatological: negative for rash Respiratory: negative for cough or wheezing Urologic: negative for hematuria Abdominal: negative for nausea, vomiting, diarrhea, bright red blood per rectum, melena, or hematemesis Neurologic: negative for visual changes, syncope, or dizziness All other systems reviewed and are otherwise negative except as noted above.    Blood pressure 155/79, pulse 60, height 6'  (1.829 m), weight 232 lb (105.235 kg).  General appearance: alert and no distress Neck: no adenopathy, no carotid bruit, no JVD, supple, symmetrical, trachea midline and thyroid not enlarged, symmetric, no tenderness/mass/nodules Lungs: clear to auscultation bilaterally Heart: regular rate and rhythm, S1, S2 normal, no murmur, click, rub or gallop Extremities: extremities normal, atraumatic, no cyanosis or edema  EKG normal sinus rhythm at 60 with an ST or T-wave changes. I personally reviewed this EKG  ASSESSMENT AND PLAN:   PAF successful cardioversion  03/07/12 to SR/SB History of PAF status post DC cardioversion in the past maintaining sinus rhythm on Xarelto .  CAD, prior CFX/RCA DES Dec 2011, cath 03/04/12  patent stents- stable CAD History of CAD status post stenting of the circumflex and RCA in the past. He was cathed 03/02/12 revealing patent stents with 40-50% mid AV groove stenosis in proximal LAD stenosis with normal LV function.He denies chest pain or shortness of breath.  Dyslipidemia History of hyperlipidemia on statin therapyas well as Lovaza followed by his PCP.  HTN (hypertension) history of hypertension with blood pressure measured at 155/79. He is on carvedilol, diltiazem, hydralazine and losartan. Continue current meds at current dosing  Sleep apnea, on C-Pap History of obstructive sleep apnea on c Pap which he benefits from.      Lorretta Harp MD FACP,FACC,FAHA, [facility] 05/10/2016 8:31 AM

## 2016-05-10 NOTE — Assessment & Plan Note (Signed)
History of PAF status post DC cardioversion in the past maintaining sinus rhythm on Xarelto .

## 2016-09-28 ENCOUNTER — Telehealth: Payer: Self-pay | Admitting: *Deleted

## 2016-09-28 NOTE — Telephone Encounter (Signed)
OK to hold Xarelto and ASA for Colonoscopy

## 2016-09-28 NOTE — Telephone Encounter (Signed)
Requesting surgical clearance:   1. Type of surgery: Colonoscopy  2. Surgeon: Dr Gustavo Lah  3. Surgical date: 01/05/17  4. Medications that need to be held: Xarelto and aspirin  5. CAD: Yes     6. I will defer to: Dr Gwenlyn Found   Attn: Ronda/Tabitha Fax- [phone] Phone [phone]

## 2016-09-29 NOTE — Telephone Encounter (Signed)
Routing to pharmacy to address.

## 2016-09-29 NOTE — Telephone Encounter (Signed)
Hold Xarelto for 2 days prior and ASA for 7 days prior to colonoscopy. Restart both evening of procedure.

## 2016-09-29 NOTE — Telephone Encounter (Signed)
Encounter routed to number provided via EPIC. 

## 2016-10-06 ENCOUNTER — Telehealth: Payer: Self-pay | Admitting: Cardiovascular Disease

## 2016-10-06 NOTE — Telephone Encounter (Signed)
Unfortunately there are multiple forms of what is called diltiazem ER.  My guess is that it is a 12 hour formulation, but she will need to contact her insurer to get more information to clarify this.  Once we know if it is a 12 or 24 hr formulation we can determine the correct dose.

## 2016-10-06 NOTE — Telephone Encounter (Signed)
Left msg for patient to call. 

## 2016-10-06 NOTE — Telephone Encounter (Signed)
Spoke w patient regarding request. He notes this is regarding a medication cost increase for new plan year effective Dec 11 2016. He will need alternative to his prescribed Cardizem CD. Cardizem ER is suggested by his insurer as suitable alternative (as is verapimil). Informed patient should be fine to switch over to cardizem ER (will verify dose equivalency w pharmD). He voiced understanding. Requests #[phone] dispense to [facility] and has enough of current med on-hand to last through mid-January. Aware we can submit this Rx change at the 1st of the year.

## 2016-10-06 NOTE — Telephone Encounter (Signed)
New message    Pt verbalized that he wants to speak to rn about medication change from tier 2 to a tier 3

## 2016-10-09 NOTE — Telephone Encounter (Signed)
New message   Pt returning call to dr  Concerning medication

## 2016-10-09 NOTE — Telephone Encounter (Signed)
Current dose is diltiazem CD 180 mg qd.  Change to diltiazem ER 90 mg bid

## 2016-10-09 NOTE — Telephone Encounter (Signed)
Spoke to patient. They gave verapimil ER or diltiazem IR (12 hr formulation) as recommendations. Either would be covered. He also states he was on a BID diltiazem in the past.

## 2016-10-10 MED ORDER — DILTIAZEM HCL ER 90 MG PO CP12: 90.0000 mg | ORAL_CAPSULE | Freq: Two times a day (BID) | ORAL | 3 refills | Status: DC

## 2016-10-10 NOTE — Telephone Encounter (Signed)
Patient not home. Spoke to wife ok per DPR. Information given to her to change to dilitiazem ER 90 mg by mouth twice a day. She verbalized understanding, wrote information down and will let patient know. New RX sent to verified pharmacy and hold RX D/c'd.

## 2017-01-04 ENCOUNTER — Encounter: Payer: Self-pay | Admitting: *Deleted

## 2017-01-05 ENCOUNTER — Ambulatory Visit: Payer: Medicare Other | Admitting: Anesthesiology

## 2017-01-05 ENCOUNTER — Ambulatory Visit
Admission: RE | Admit: 2017-01-05 | Discharge: 2017-01-05 | Disposition: A | Discharge status: Home / Self Care | Payer: Medicare Other | Source: Ambulatory Visit | Attending: Gastroenterology | Admitting: Gastroenterology

## 2017-01-05 ENCOUNTER — Encounter
Admission: RE | Disposition: A | Discharge status: Home / Self Care | Payer: Self-pay | Source: Ambulatory Visit | Attending: Gastroenterology

## 2017-01-05 ENCOUNTER — Encounter: Payer: Self-pay | Admitting: *Deleted

## 2017-01-05 DIAGNOSIS — Z7982 Long term (current) use of aspirin: Secondary | ICD-10-CM | POA: Diagnosis not present

## 2017-01-05 DIAGNOSIS — I2510 Atherosclerotic heart disease of native coronary artery without angina pectoris: Secondary | ICD-10-CM | POA: Insufficient documentation

## 2017-01-05 DIAGNOSIS — Z800 Family history of malignant neoplasm of digestive organs: Secondary | ICD-10-CM | POA: Diagnosis not present

## 2017-01-05 DIAGNOSIS — K644 Residual hemorrhoidal skin tags: Secondary | ICD-10-CM | POA: Insufficient documentation

## 2017-01-05 DIAGNOSIS — K621 Rectal polyp: Secondary | ICD-10-CM | POA: Diagnosis not present

## 2017-01-05 DIAGNOSIS — D124 Benign neoplasm of descending colon: Secondary | ICD-10-CM | POA: Insufficient documentation

## 2017-01-05 DIAGNOSIS — E1122 Type 2 diabetes mellitus with diabetic chronic kidney disease: Secondary | ICD-10-CM | POA: Insufficient documentation

## 2017-01-05 DIAGNOSIS — N183 Chronic kidney disease, stage 3 (moderate): Secondary | ICD-10-CM | POA: Diagnosis not present

## 2017-01-05 DIAGNOSIS — E7800 Pure hypercholesterolemia, unspecified: Secondary | ICD-10-CM | POA: Insufficient documentation

## 2017-01-05 DIAGNOSIS — G4730 Sleep apnea, unspecified: Secondary | ICD-10-CM | POA: Insufficient documentation

## 2017-01-05 DIAGNOSIS — I480 Paroxysmal atrial fibrillation: Secondary | ICD-10-CM | POA: Insufficient documentation

## 2017-01-05 DIAGNOSIS — Z1211 Encounter for screening for malignant neoplasm of colon: Secondary | ICD-10-CM | POA: Diagnosis not present

## 2017-01-05 DIAGNOSIS — Z8371 Family history of colonic polyps: Secondary | ICD-10-CM | POA: Insufficient documentation

## 2017-01-05 DIAGNOSIS — M1990 Unspecified osteoarthritis, unspecified site: Secondary | ICD-10-CM | POA: Insufficient documentation

## 2017-01-05 DIAGNOSIS — K219 Gastro-esophageal reflux disease without esophagitis: Secondary | ICD-10-CM | POA: Insufficient documentation

## 2017-01-05 DIAGNOSIS — K635 Polyp of colon: Secondary | ICD-10-CM | POA: Insufficient documentation

## 2017-01-05 DIAGNOSIS — Z7901 Long term (current) use of anticoagulants: Secondary | ICD-10-CM | POA: Insufficient documentation

## 2017-01-05 DIAGNOSIS — D122 Benign neoplasm of ascending colon: Secondary | ICD-10-CM | POA: Diagnosis not present

## 2017-01-05 DIAGNOSIS — K642 Third degree hemorrhoids: Secondary | ICD-10-CM | POA: Diagnosis not present

## 2017-01-05 DIAGNOSIS — I129 Hypertensive chronic kidney disease with stage 1 through stage 4 chronic kidney disease, or unspecified chronic kidney disease: Secondary | ICD-10-CM | POA: Diagnosis not present

## 2017-01-05 DIAGNOSIS — Z79899 Other long term (current) drug therapy: Secondary | ICD-10-CM | POA: Diagnosis not present

## 2017-01-05 HISTORY — PX: COLONOSCOPY WITH PROPOFOL: SHX5780

## 2017-01-05 HISTORY — DX: Cardiac arrhythmia, unspecified: I49.9

## 2017-01-05 LAB — GLUCOSE, CAPILLARY: Glucose-Capillary: 150 mg/dL — ABNORMAL HIGH (ref 65–99)

## 2017-01-05 SURGERY — COLONOSCOPY WITH PROPOFOL
Anesthesia: General

## 2017-01-05 MED ORDER — PROPOFOL 500 MG/50ML IV EMUL
INTRAVENOUS | Status: DC | PRN
  Administered 2017-01-05: 120 ug/kg/min via INTRAVENOUS

## 2017-01-05 MED ORDER — EPHEDRINE 5 MG/ML INJ
INTRAVENOUS | Status: AC
  Filled 2017-01-05: qty 10

## 2017-01-05 MED ORDER — EPHEDRINE SULFATE 50 MG/ML IJ SOLN
INTRAMUSCULAR | Status: DC | PRN
  Administered 2017-01-05 (×3): 10 mg via INTRAVENOUS

## 2017-01-05 MED ORDER — SODIUM CHLORIDE 0.9 % IV SOLN: INTRAVENOUS | Status: DC

## 2017-01-05 MED ORDER — LIDOCAINE HCL (CARDIAC) 20 MG/ML IV SOLN
INTRAVENOUS | Status: DC | PRN
  Administered 2017-01-05: 2 mL via INTRAVENOUS

## 2017-01-05 MED ORDER — PROPOFOL 10 MG/ML IV BOLUS
INTRAVENOUS | Status: DC | PRN
  Administered 2017-01-05: 40 mg via INTRAVENOUS

## 2017-01-05 MED ORDER — SODIUM CHLORIDE 0.9 % IV SOLN
INTRAVENOUS | Status: DC
  Administered 2017-01-05: 1000 mL via INTRAVENOUS

## 2017-01-05 MED ORDER — LIDOCAINE HCL (PF) 2 % IJ SOLN
INTRAMUSCULAR | Status: AC
  Filled 2017-01-05: qty 2

## 2017-01-05 MED ORDER — PROPOFOL 500 MG/50ML IV EMUL
INTRAVENOUS | Status: AC
  Filled 2017-01-05: qty 50

## 2017-01-05 NOTE — Anesthesia Postprocedure Evaluation (Signed)
Anesthesia Post Note  Patient: Jason Neal  Procedure(s) Performed: Procedure(s) (LRB): COLONOSCOPY WITH PROPOFOL (N/A)  Patient location during evaluation: PACU Anesthesia Type: General Level of consciousness: awake Pain management: pain level controlled Vital Signs Assessment: post-procedure vital signs reviewed and stable Respiratory status: spontaneous breathing Cardiovascular status: stable Anesthetic complications: no     Last Vitals:  Vitals:   01/05/17 1128 01/05/17 1138  BP: 122/76 137/76  Pulse: (!) 57 (!) 57  Resp: 14 16  Temp:      Last Pain:  Vitals:   01/05/17 1058  TempSrc: Tympanic                 VAN STAVEREN,Shallyn Constancio

## 2017-01-05 NOTE — Anesthesia Post-op Follow-up Note (Signed)
Anesthesia QCDR form completed.        

## 2017-01-05 NOTE — Op Note (Signed)
[facility] Gastroenterology Patient Name: Jason Neal Procedure Date: 01/05/2017 10:09 AM MRN: [id number] Account #: [id number] Date of Birth: 1951-07-24 Admit Type: Outpatient Age: 66 Room: [facility] ENDO ROOM 1 Gender: Male Note Status: Finalized Procedure:            Colonoscopy Indications:          Screening for colon cancer: Family history of                        colorectal cancer in distant relative(s), Colon cancer                        screening in patient at increased risk: Family history                        of colon polyps in multiple 1st-degree relatives Providers:            Lollie Sails, MD Referring MD:         Ocie Cornfield. Ouida Sills MD, MD (Referring MD) Medicines:            Monitored Anesthesia Care Complications:        No immediate complications. Procedure:            Pre-Anesthesia Assessment:                       - ASA Grade Assessment: III - A patient with severe                        systemic disease.                       After obtaining informed consent, the colonoscope was                        passed under direct vision. Throughout the procedure,                        the patient's blood pressure, pulse, and oxygen                        saturations were monitored continuously. The                        Colonoscope was introduced through the anus and                        advanced to the the cecum, identified by appendiceal                        orifice and ileocecal valve. The colonoscopy was                        performed without difficulty. The patient tolerated the                        procedure well. The quality of the bowel preparation                        was good. Findings:      A 3 mm polyp was found in the descending colon. The  polyp was sessile.       The polyp was removed with a cold biopsy forceps. Resection and       retrieval were complete.      A 2 mm polyp was found in the ascending colon. The  polyp was flat. The       polyp was removed with a cold biopsy forceps. Resection and retrieval       were complete.      A 2 mm polyp was found in the cecum. The polyp was flat. The polyp was       removed with a cold biopsy forceps. Resection and retrieval were       complete.      A less than 1 mm polyp was found in the rectum. The polyp was sessile.       The polyp was removed with a cold biopsy forceps. Resection and       retrieval were complete.      Non-bleeding external hemorrhoids were found during perianal exam. The       hemorrhoids were medium-sized and Grade III (internal hemorrhoids that       prolapse but require manual reduction).      The retroflexed view of the distal rectum and anal verge was normal and       showed no anal or rectal abnormalities. Impression:           - One 3 mm polyp in the descending colon, removed with                        a cold biopsy forceps. Resected and retrieved.                       - One 2 mm polyp in the ascending colon, removed with a                        cold biopsy forceps. Resected and retrieved.                       - One 2 mm polyp in the cecum, removed with a cold                        biopsy forceps. Resected and retrieved.                       - One less than 1 mm polyp in the rectum, removed with                        a cold biopsy forceps. Resected and retrieved.                       - Non-bleeding external hemorrhoids.                       - The distal rectum and anal verge are normal on                        retroflexion view. Recommendation:       - Use Analpram HC Cream 2.5%: Apply externally TID for                        10  days.                       - Miralax 1 capful (17 grams) in 8 ounces of water PO                        daily.                       - Refer to a colo-rectal surgeon at appointment to be                        scheduled. Procedure Code(s):    --- Professional ---                        [phone], Colonoscopy, flexible; with biopsy, single or                        multiple Diagnosis Code(s):    --- Professional ---                       Z12.11, Encounter for screening for malignant neoplasm                        of colon                       Z80.0, Family history of malignant neoplasm of                        digestive organs                       Z83.71, Family history of colonic polyps                       D12.4, Benign neoplasm of descending colon                       D12.2, Benign neoplasm of ascending colon                       D12.0, Benign neoplasm of cecum                       K62.1, Rectal polyp                       K64.2, Third degree hemorrhoids                       K64.4, Residual hemorrhoidal skin tags CPT copyright 2016 American Medical Association. All rights reserved. The codes documented in this report are preliminary and upon coder review may  be revised to meet current compliance requirements. Lollie Sails, MD 01/05/2017 11:03:50 AM This report has been signed electronically. Number of Addenda: 0 Note Initiated On: 01/05/2017 10:09 AM Scope Withdrawal Time: 0 hours 5 minutes 33 seconds  Total Procedure Duration: 0 hours 18 minutes 38 seconds       [facility]

## 2017-01-05 NOTE — Anesthesia Preprocedure Evaluation (Signed)
Anesthesia Evaluation  Patient identified by MRN, date of birth, ID band Patient awake    Reviewed: Allergy & Precautions, NPO status , Patient's Chart, lab work & pertinent test results  Airway Mallampati: II       Dental  (+) Teeth Intact   Pulmonary sleep apnea and Continuous Positive Airway Pressure Ventilation ,     + decreased breath sounds      Cardiovascular Exercise Tolerance: Good hypertension, Pt. on medications + CAD  + dysrhythmias Atrial Fibrillation  Rhythm:Irregular     Neuro/Psych    GI/Hepatic Neg liver ROS, GERD  Medicated,  Endo/Other  diabetes, Type 2  Renal/GU Renal InsufficiencyRenal diseasenegative Renal ROS     Musculoskeletal   Abdominal (+) + obese,   Peds  Hematology   Anesthesia Other Findings   Reproductive/Obstetrics                             Anesthesia Physical Anesthesia Plan  ASA: III  Anesthesia Plan: General   Post-op Pain Management:    Induction: Intravenous  Airway Management Planned: Natural Airway and Nasal Cannula  Additional Equipment:   Intra-op Plan:   Post-operative Plan:   Informed Consent: I have reviewed the patients History and Physical, chart, labs and discussed the procedure including the risks, benefits and alternatives for the proposed anesthesia with the patient or authorized representative who has indicated his/her understanding and acceptance.     Plan Discussed with: Surgeon  Anesthesia Plan Comments:         Anesthesia Quick Evaluation

## 2017-01-05 NOTE — Transfer of Care (Signed)
Immediate Anesthesia Transfer of Care Note  Patient: Jason Neal  Procedure(s) Performed: Procedure(s): COLONOSCOPY WITH PROPOFOL (N/A)  Patient Location: PACU  Anesthesia Type:General  Level of Consciousness: awake  Airway & Oxygen Therapy: Patient Spontanous Breathing and Patient connected to nasal cannula oxygen  Post-op Assessment: Report given to RN and Post -op Vital signs reviewed and stable  Post vital signs: Reviewed  Last Vitals:  Vitals:   01/05/17 0955  BP: (!) 145/72  Pulse: 66  Resp: 16  Temp: 36.6 C    Last Pain:  Vitals:   01/05/17 0955  TempSrc: Tympanic         Complications: No apparent anesthesia complications

## 2017-01-05 NOTE — H&P (Signed)
Outpatient short stay form Pre-procedure 01/05/2017 10:22 AM Lollie Sails MD  Primary Physician: Frazier Richards, M.D.  Reason for visit:  Colonoscopy  History of present illness:  Patient is a 66 year old male presenting today as above. His last colonoscopy was a little over 10 years ago. There is family history of polyps as well as colon cancer as noted. He tolerated his prep well. He does take Xarelto as well as 1 mg aspirin which he is held for about 5 days. He takes no other aspirin products. He does have some amount of some chronic constipation issue. We discussed this at length today recommended he try some MiraLAX on a regular basis.    Current Facility-Administered Medications:  .  0.9 %  sodium chloride infusion, , Intravenous, Continuous, Lollie Sails, MD, Last Rate: 20 mL/hr at 01/05/17 1011, 1,000 mL at 01/05/17 1011 .  0.9 %  sodium chloride infusion, , Intravenous, Continuous, Lollie Sails, MD  Prescriptions Prior to Admission  Medication Sig Dispense Refill Last Dose  . aspirin EC 81 MG tablet Take 1 tablet (81 mg total) by mouth daily. 90 tablet 3 n at Unknown time  . carvedilol (COREG) 6.25 MG tablet Take 1 tablet by mouth 2 (two) times daily.   01/05/2017 at 0530  . cetirizine (ZYRTEC) 10 MG tablet Take 5 mg by mouth daily.   01/05/2017 at 0530  . diltiazem (CARDIZEM SR) 90 MG 12 hr capsule Take 1 capsule (90 mg total) by mouth 2 (two) times daily. 180 capsule 3 01/05/2017 at 0530  . esomeprazole (NEXIUM) 40 MG capsule Take 40 mg by mouth daily at 12 noon.   01/05/2017 at 0530  . XARELTO 20 MG TABS tablet TAKE 1 TABLET DAILY 30 tablet 0 12/31/2016 at Unknown time  . ACCU-CHEK AVIVA PLUS test strip    Taking  . atorvastatin (LIPITOR) 40 MG tablet Take 40 mg by mouth daily.   Taking  . B Complex-C (B-COMPLEX WITH VITAMIN C) tablet Take 1 tablet by mouth daily.   Taking  . cholecalciferol (VITAMIN D) 1000 UNITS tablet Take 1,000-2,000 Units by mouth See admin  instructions. 2000units in am, 1000 units in pm   Taking  . fluticasone (FLONASE) 50 MCG/ACT nasal spray Place 1 spray into the nose as needed.   Taking  . hydrALAZINE (APRESOLINE) 50 MG tablet Take 50 mg by mouth 2 (two) times daily.   Taking  . Lancets (ACCU-CHEK MULTICLIX) lancets    Taking  . losartan (COZAAR) 100 MG tablet TAKE 1 TABLET DAILY 90 tablet 0 Taking  . nitroGLYCERIN (NITROSTAT) 0.4 MG SL tablet Place 1 tablet (0.4 mg total) under the tongue every 5 (five) minutes x 3 doses as needed for chest pain. 25 tablet 4 Taking  . omega-3 acid ethyl esters (LOVAZA) 1 G capsule Take 1 g by mouth 2 (two) times daily.   Taking     Allergies  Allergen Reactions  . Angiotensin Receptor Blockers Cough  . Demeclocycline Other (See Comments)     Past Medical History:  Diagnosis Date  . CAD (coronary artery disease)    prior CFX/RCA PCI 2011, cath OK 3/13  . Carotid bruit 03/14/11   CAROTID DUPLEX DOPPLER-Right bulb; demonstrated a mild amount of fibrous plaque w/o evidence of a significant diameter reduction, dissection or any other vascular abnormality.;Left ICA;-proximal, mid and distal segments of the internal carotid artery normal patency.mid and distal segments demonstrating moderate tortuosity..Mildly abnormal carotid duplex scan.  . Chronic renal insufficiency,  stage III (moderate)   . DJD (degenerative joint disease)    c-spine  . Dyslipidemia   . Dysrhythmia   . GERD (gastroesophageal reflux disease)   . Hypercholesteremia   . Hypertension   . PAF (paroxysmal atrial fibrillation) [facility]) March 2013  . Sleep apnea    on C-Pap  . Type 2 diabetes mellitus with chronic kidney disease (HCC)    diet controlled    Review of systems:      Physical Exam    Heart and lungs: Regular rate and rhythm without rub or gallop, lungs are bilaterally clear.    HEENT: Septic atraumatic eyes are anicteric    Other:     Pertinant exam for procedure: Soft nontender nondistended bowel  sounds positive normoactive some mild generalized discomfort no masses or rebound.    Planned proceedures: Colonoscopy and indicated procedures.    Lollie Sails, MD Gastroenterology 01/05/2017  10:22 AM

## 2017-01-08 ENCOUNTER — Encounter: Payer: Self-pay | Admitting: Gastroenterology

## 2017-01-08 LAB — SURGICAL PATHOLOGY

## 2017-05-15 ENCOUNTER — Ambulatory Visit (INDEPENDENT_AMBULATORY_CARE_PROVIDER_SITE_OTHER): Payer: Medicare Other | Admitting: Cardiovascular Disease

## 2017-05-15 ENCOUNTER — Encounter: Payer: Self-pay | Admitting: Cardiovascular Disease

## 2017-05-15 DIAGNOSIS — I480 Paroxysmal atrial fibrillation: Secondary | ICD-10-CM

## 2017-05-15 DIAGNOSIS — R0989 Other specified symptoms and signs involving the circulatory and respiratory systems: Secondary | ICD-10-CM | POA: Diagnosis not present

## 2017-05-15 DIAGNOSIS — G4733 Obstructive sleep apnea (adult) (pediatric): Secondary | ICD-10-CM

## 2017-05-15 DIAGNOSIS — E785 Hyperlipidemia, unspecified: Secondary | ICD-10-CM | POA: Diagnosis not present

## 2017-05-15 DIAGNOSIS — I2510 Atherosclerotic heart disease of native coronary artery without angina pectoris: Secondary | ICD-10-CM | POA: Diagnosis not present

## 2017-05-15 DIAGNOSIS — I10 Essential (primary) hypertension: Secondary | ICD-10-CM

## 2017-05-15 NOTE — Progress Notes (Signed)
05/15/2017 Jason Neal   Jul 22, 1951  [id number]  Primary Physician Kirk Ruths, MD Primary Cardiologist: Lorretta Harp MD Renae Gloss  HPI:  The patient is a 66 year old mildly overweight fit appearing married Caucasian male father of 2, grandfather of 3 grandchildren whose wife is also a patient of mine.his wife apparently has recently retired. He is taking care of his mother who has dementia as well. I last saw him in the office 05/10/16.[name] He has a history of CAD with stenting of his circumflex and RCA in the past. He was last cathed March 02, 2012 revealing patent stents with 40-50% mid AV groove circumflex and proximal LAD stenosis with normal LV function. His other problems include history of PAF with RVR, cardioverted to sinus rhythm on Xarelto. He has hypertension, hyperlipidemia and obstructive sleep apnea on CPAP. 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 His weight had gotten as high as 242 pounds as a result of dietary indiscretion with resulting increase in his triglyceride level to about 600. Since I saw him he ago he has remained asymptomatic.  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   Current Outpatient Prescriptions  Medication Sig Dispense Refill  . ACCU-CHEK AVIVA PLUS test strip     . aspirin EC 81 MG tablet Take 1 tablet (81 mg total) by mouth daily. 90 tablet 3  . atorvastatin (LIPITOR) 40 MG tablet Take 40 mg by mouth daily.    . B Complex-C (B-COMPLEX WITH VITAMIN C) tablet Take 1 tablet by mouth daily.    . carvedilol (COREG) 6.25 MG tablet Take 1 tablet by mouth 2 (two) times daily.    . cetirizine (ZYRTEC) 10 MG tablet Take 5 mg by mouth daily.    . cholecalciferol (VITAMIN D) 1000 UNITS tablet Take 1,000-2,000 Units by mouth See admin instructions. 2000units in am, 1000 units in pm    . diltiazem  (CARDIZEM SR) 90 MG 12 hr capsule Take 1 capsule (90 mg total) by mouth 2 (two) times daily. 180 capsule 3  . esomeprazole (NEXIUM) 40 MG capsule Take 40 mg by mouth daily at 12 noon.    . fluticasone (FLONASE) 50 MCG/ACT nasal spray Place 1 spray into the nose as needed.    . hydrALAZINE (APRESOLINE) 50 MG tablet Take 50 mg by mouth 2 (two) times daily.    . Lancets (ACCU-CHEK MULTICLIX) lancets     . losartan (COZAAR) 100 MG tablet TAKE 1 TABLET DAILY 90 tablet 0  . nitroGLYCERIN (NITROSTAT) 0.4 MG SL tablet Place 0.4 mg under the tongue every 5 (five) minutes as needed for chest pain.    [name] omega-3 acid ethyl esters (LOVAZA) 1 G capsule Take 1 g by mouth 2 (two) times daily.    Alveda Reasons 20 MG TABS tablet TAKE 1 TABLET DAILY 30 tablet 0   No current facility-administered medications for this visit.     Allergies  Allergen Reactions  . Angiotensin Receptor Blockers Cough  . Demeclocycline Other (See Comments)    Social History   Social History  . Marital status: Married    Spouse name: N/A  . Number of children: N/A  . Years of education: N/A   Occupational History  . Not on file.   Social History Main Topics  . Smoking status: Never  Smoker  . Smokeless tobacco: Never Used  . Alcohol use No  . Drug use: Unknown  . Sexual activity: Not on file   Other Topics Concern  . Not on file   Social History Narrative  . No narrative on file     Review of Systems: General: negative for chills, fever, night sweats or weight changes.  Cardiovascular: negative for chest pain, dyspnea on exertion, edema, orthopnea, palpitations, paroxysmal nocturnal dyspnea or shortness of breath Dermatological: negative for rash Respiratory: negative for cough or wheezing Urologic: negative for hematuria Abdominal: negative for nausea, vomiting, diarrhea, bright red blood per rectum, melena, or hematemesis Neurologic: negative for visual changes, syncope, or dizziness All other systems reviewed  and are otherwise negative except as noted above.    Blood pressure (!) 146/70, pulse 66, height 5\' 11"  (1.803 m), weight 228 lb (103.4 kg).  General appearance: alert and no distress Neck: no adenopathy, no carotid bruit, no JVD, supple, symmetrical, trachea midline and thyroid not enlarged, symmetric, no tenderness/mass/nodules Lungs: clear to auscultation bilaterally Heart: regular rate and rhythm, S1, S2 normal, no murmur, click, rub or gallop Extremities: extremities normal, atraumatic, no cyanosis or edema  EKG normal sinus rhythm at 66 without ST or T-wave changes. I personally reviewed this EKG  ASSESSMENT AND PLAN:   PAF successful cardioversion  03/07/12 to SR/SB History of paroxysmal atrial fibrillation status post cardioversion in the past maintaining sinus rhythm on Xarelto   CAD, prior CFX/RCA DES Dec 2011, cath 03/04/12  patent stents- stable CAD History of coronary artery disease status post stenting of the circumflex and RCA in the past. I catheterized him 03/02/2012 revealing patent stents with 40-50% mid AV groove circumflex and proximal LAD stenosis with normal LV function. 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 He is on carvedilol, diltiazem, and hydralazine. Continue current meds at current dosing.  Sleep apnea, on C-Pap  History of obstructive sleep apnea on CPAP.      Lorretta Harp MD FACP,FACC,FAHA, FSCAI 05/15/2017 8:30 AM

## 2017-05-15 NOTE — Patient Instructions (Signed)
Medication Instructions: Your physician recommends that you continue on your current medications as directed. Please refer to the Current Medication list given to you today.   Labwork: I will request recent lab work from Dr. Tonette Bihari office.  Testing/Procedures: Your physician has requested that you have a carotid duplex. This test is an ultrasound of the carotid arteries in your neck. It looks at blood flow through these arteries that supply the brain with blood. Allow one hour for this exam. There are no restrictions or special instructions.  Your physician has requested that you have an echocardiogram. Echocardiography is a painless test that uses sound waves to create images of your heart. It provides your doctor with information about the size and shape of your heart and how well your heart's chambers and valves are working. This procedure takes approximately one hour. There are no restrictions for this procedure.  Follow-Up: Your physician wants you to follow-up in: 1 year with Dr. Gwenlyn Found. You will receive a reminder letter in the mail two months in advance. If you don't receive a letter, please call our office to schedule the follow-up appointment.  If you need a refill on your cardiac medications before your next appointment, please call your pharmacy.

## 2017-05-15 NOTE — Assessment & Plan Note (Addendum)
History of essential hypertension blood pressure measured 146/70. He is on carvedilol, diltiazem, and hydralazine. Continue current meds at current dosing.

## 2017-05-15 NOTE — Assessment & Plan Note (Signed)
History of CAD on statin therapy as well as Lovaza followed by his PCP

## 2017-05-15 NOTE — Assessment & Plan Note (Signed)
History of paroxysmal atrial fibrillation status post cardioversion in the past maintaining sinus rhythm on Xarelto

## 2017-05-15 NOTE — Assessment & Plan Note (Signed)
History of obstructive sleep apnea on CPAP. 

## 2017-05-15 NOTE — Assessment & Plan Note (Signed)
History of coronary artery disease status post stenting of the circumflex and RCA in the past. I catheterized him 03/02/2012 revealing patent stents with 40-50% mid AV groove circumflex and proximal LAD stenosis with normal LV function. 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

## 2017-05-17 NOTE — Addendum Note (Signed)
Addended by: Debria Garret on: 05/17/2017 03:13 PM   Modules accepted: Orders

## 2017-05-30 ENCOUNTER — Ambulatory Visit (HOSPITAL_COMMUNITY)
Admission: RE | Admit: 2017-05-30 | Discharge: 2017-05-30 | Disposition: A | Discharge status: Home / Self Care | Payer: Medicare Other | Source: Ambulatory Visit | Attending: Cardiovascular Disease | Admitting: Cardiovascular Disease

## 2017-05-30 ENCOUNTER — Other Ambulatory Visit: Payer: Self-pay

## 2017-05-30 ENCOUNTER — Ambulatory Visit (HOSPITAL_BASED_OUTPATIENT_CLINIC_OR_DEPARTMENT_OTHER): Payer: Medicare Other

## 2017-05-30 DIAGNOSIS — I083 Combined rheumatic disorders of mitral, aortic and tricuspid valves: Secondary | ICD-10-CM | POA: Insufficient documentation

## 2017-05-30 DIAGNOSIS — I2510 Atherosclerotic heart disease of native coronary artery without angina pectoris: Secondary | ICD-10-CM | POA: Diagnosis not present

## 2017-05-30 DIAGNOSIS — I6523 Occlusion and stenosis of bilateral carotid arteries: Secondary | ICD-10-CM | POA: Diagnosis not present

## 2017-05-30 DIAGNOSIS — R0989 Other specified symptoms and signs involving the circulatory and respiratory systems: Secondary | ICD-10-CM

## 2017-05-30 DIAGNOSIS — I10 Essential (primary) hypertension: Secondary | ICD-10-CM | POA: Diagnosis not present

## 2017-05-30 DIAGNOSIS — I480 Paroxysmal atrial fibrillation: Secondary | ICD-10-CM | POA: Insufficient documentation

## 2017-05-30 DIAGNOSIS — E785 Hyperlipidemia, unspecified: Secondary | ICD-10-CM | POA: Diagnosis not present

## 2017-05-30 DIAGNOSIS — I700 Atherosclerosis of aorta: Secondary | ICD-10-CM | POA: Diagnosis not present

## 2017-12-14 ENCOUNTER — Other Ambulatory Visit: Payer: Self-pay | Admitting: Cardiovascular Disease

## 2017-12-21 ENCOUNTER — Telehealth: Payer: Self-pay | Admitting: Cardiovascular Disease

## 2017-12-21 DIAGNOSIS — I480 Paroxysmal atrial fibrillation: Secondary | ICD-10-CM

## 2017-12-21 NOTE — Telephone Encounter (Signed)
Patient calling, states that he was told to schedule echo for June. Patient needs order for echo

## 2017-12-24 NOTE — Telephone Encounter (Signed)
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 Repeat 12 months.

## 2018-05-20 ENCOUNTER — Other Ambulatory Visit: Payer: Self-pay

## 2018-05-20 ENCOUNTER — Ambulatory Visit (HOSPITAL_COMMUNITY): Payer: Medicare Other | Attending: Cardiovascular Disease

## 2018-05-20 DIAGNOSIS — I119 Hypertensive heart disease without heart failure: Secondary | ICD-10-CM | POA: Diagnosis not present

## 2018-05-20 DIAGNOSIS — E785 Hyperlipidemia, unspecified: Secondary | ICD-10-CM | POA: Diagnosis not present

## 2018-05-20 DIAGNOSIS — I480 Paroxysmal atrial fibrillation: Secondary | ICD-10-CM | POA: Insufficient documentation

## 2018-05-20 DIAGNOSIS — I77810 Thoracic aortic ectasia: Secondary | ICD-10-CM | POA: Diagnosis not present

## 2018-05-20 DIAGNOSIS — I2510 Atherosclerotic heart disease of native coronary artery without angina pectoris: Secondary | ICD-10-CM | POA: Diagnosis not present

## 2018-05-20 DIAGNOSIS — G4733 Obstructive sleep apnea (adult) (pediatric): Secondary | ICD-10-CM | POA: Diagnosis not present

## 2018-05-20 DIAGNOSIS — I350 Nonrheumatic aortic (valve) stenosis: Secondary | ICD-10-CM | POA: Insufficient documentation

## 2018-05-28 ENCOUNTER — Ambulatory Visit (INDEPENDENT_AMBULATORY_CARE_PROVIDER_SITE_OTHER): Payer: Medicare Other | Admitting: Cardiovascular Disease

## 2018-05-28 ENCOUNTER — Encounter: Payer: Self-pay | Admitting: Cardiovascular Disease

## 2018-05-28 VITALS — BP 158/86 | HR 71 | Ht 72.0 in | Wt 229.0 lb

## 2018-05-28 DIAGNOSIS — G4730 Sleep apnea, unspecified: Secondary | ICD-10-CM | POA: Diagnosis not present

## 2018-05-28 DIAGNOSIS — E785 Hyperlipidemia, unspecified: Secondary | ICD-10-CM | POA: Diagnosis not present

## 2018-05-28 DIAGNOSIS — I10 Essential (primary) hypertension: Secondary | ICD-10-CM | POA: Diagnosis not present

## 2018-05-28 DIAGNOSIS — I350 Nonrheumatic aortic (valve) stenosis: Secondary | ICD-10-CM | POA: Insufficient documentation

## 2018-05-28 NOTE — Assessment & Plan Note (Signed)
History of paroxysmal atrial fib in the past maintaining sinus rhythm on Xarelto

## 2018-05-28 NOTE — Assessment & Plan Note (Signed)
History of essential hypertension her blood pressure measured today at 158/86.  He is on carvedilol, losartan and hydralazine.  Continue current meds at current dosing.

## 2018-05-28 NOTE — Progress Notes (Signed)
05/28/2018 Jason Neal   11-08-1951  [id number]  Primary Physician Kirk Ruths, MD Primary Cardiologist: Lorretta Harp MD [name], [name]  HPI:  Jason Neal is a 67 y.o.  mildly overweight fit appearing married Caucasian male father of 2, grandfather of 3 grandchildren whose wife is also a patient of mine.his wife apparently has recently retired. He was  taking care of his mother who has dementia who unfortunately passed away January 17, 2018.. I last saw him in the office  05/15/2017.Jason Neal He has a history of CAD with stenting of his circumflex and RCA in the past. He was last cathed March 02, 2012 revealing patent stents with 40-50% mid AV groove circumflex and proximal LAD stenosis with normal LV function. His other problems include history of PAF with RVR, cardioverted to sinus rhythm on Xarelto. He has hypertension, hyperlipidemia and obstructive sleep apnea on CPAP. 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 Since I saw him he ago he has remained asymptomatic. 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  He does get mild stable angina when walking up a hill which is not changed in frequency or severity.     Current Meds  Medication Sig  . ACCU-CHEK AVIVA PLUS test strip   . aspirin EC 81 MG tablet Take 1 tablet (81 mg total) by mouth daily.  Jason Neal atorvastatin (LIPITOR) 40 MG tablet Take 40 mg by mouth daily.  . B Complex-C (B-COMPLEX WITH VITAMIN C) tablet Take 1 tablet by mouth daily.  . carvedilol (COREG) 6.25 MG tablet Take 1 tablet by mouth 2 (two) times daily.  . cetirizine (ZYRTEC) 10 MG tablet Take 5 mg by mouth daily.  . cholecalciferol (VITAMIN D) 1000 UNITS tablet Take 1,000-2,000 Units by mouth See admin instructions. 2000units in am, 1000 units in pm  .  diltiazem (CARDIZEM SR) 90 MG 12 hr capsule TAKE ONE CAPSULE BY MOUTH TWICE A DAY  . esomeprazole (NEXIUM) 40 MG capsule Take 40 mg by mouth daily at 12 noon.  . fluticasone (FLONASE) 50 MCG/ACT nasal spray Place 1 spray into the nose as needed.  . hydrALAZINE (APRESOLINE) 50 MG tablet Take 50 mg by mouth 2 (two) times daily.  . Lancets (ACCU-CHEK MULTICLIX) lancets   . losartan (COZAAR) 100 MG tablet TAKE 1 TABLET DAILY  . nitroGLYCERIN (NITROSTAT) 0.4 MG SL tablet Place 0.4 mg under the tongue every 5 (five) minutes as needed for chest pain.  Jason Neal omega-3 acid ethyl esters (LOVAZA) 1 G capsule Take 1 g by mouth 2 (two) times daily.  Alveda Reasons 20 MG TABS tablet TAKE 1 TABLET DAILY     Allergies  Allergen Reactions  . Angiotensin Receptor Blockers Cough  . Demeclocycline Other (See Comments)    Social History   Socioeconomic History  . Marital status: Married    Spouse name: Not on file  . Number of children: Not on file  . Years of education: Not on file  . Highest education level: Not on file  Occupational History  . Not on file  Social Needs  . Financial resource strain: Not on file  . Food insecurity:    Worry: Not on file    Inability: Not on file  . Transportation needs:  Medical: Not on file    Non-medical: Not on file  Tobacco Use  . Smoking status: Never Smoker  . Smokeless tobacco: Never Used  Substance and Sexual Activity  . Alcohol use: No  . Drug use: Not on file  . Sexual activity: Not on file  Lifestyle  . Physical activity:    Days per week: Not on file    Minutes per session: Not on file  . Stress: Not on file  Relationships  . Social connections:    Talks on phone: Not on file    Gets together: Not on file    Attends religious service: Not on file    Active member of club or organization: Not on file    Attends meetings of clubs or organizations: Not on file    Relationship status: Not on file  . Intimate partner violence:    Fear of current or  ex partner: Not on file    Emotionally abused: Not on file    Physically abused: Not on file    Forced sexual activity: Not on file  Other Topics Concern  . Not on file  Social History Narrative  . Not on file     Review of Systems: General: negative for chills, fever, night sweats or weight changes.  Cardiovascular: negative for chest pain, dyspnea on exertion, edema, orthopnea, palpitations, paroxysmal nocturnal dyspnea or shortness of breath Dermatological: negative for rash Respiratory: negative for cough or wheezing Urologic: negative for hematuria Abdominal: negative for nausea, vomiting, diarrhea, bright red blood per rectum, melena, or hematemesis Neurologic: negative for visual changes, syncope, or dizziness All other systems reviewed and are otherwise negative except as noted above.    Blood pressure (!) 158/86, pulse 71, height 6' (1.829 m), weight 229 lb (103.9 kg).  General appearance: alert and no distress Neck: no adenopathy, no JVD, supple, symmetrical, trachea midline, thyroid not enlarged, symmetric, no tenderness/mass/nodules and Soft right carotid bruit Lungs: clear to auscultation bilaterally Heart: regular rate and rhythm, S1, S2 normal, no murmur, click, rub or gallop Extremities: extremities normal, atraumatic, no cyanosis or edema Pulses: 2+ and symmetric Skin: Skin color, texture, turgor normal. No rashes or lesions Neurologic: Alert and oriented X 3, normal strength and tone. Normal symmetric reflexes. Normal coordination and gait  EKG sinus rhythm at 71 without ST or T wave changes.  I personally reviewed this EKG.  ASSESSMENT AND PLAN:   PAF successful cardioversion  03/07/12 to SR/SB History of paroxysmal atrial fib in the past maintaining sinus rhythm on Xarelto  CAD, prior CFX/RCA DES Dec 2011, cath 03/04/12  patent stents- stable CAD History of CAD status post stenting of the circumflex and RCA by myself in the past with last cath 03/02/2012  revealing patent stents 40 to 50% mid AV groove circumflex stenosis and proximal LAD stenosis and normal LV function.  He does have mild effort angina when walking back up a hill does not change in frequency or severity.  Dyslipidemia She of dyslipidemia on statin therapy recent lipid profile performed by his PCP 02/19/2018 revealing total cholesterol 157 LDL 72 HDL of 37 and triglyceride level of 42 which is significantly improved from prior readings.  HTN (hypertension) History of essential hypertension her blood pressure measured today at 158/86.  He is on carvedilol, losartan and hydralazine.  Continue current meds at current dosing.  Sleep apnea, on C-Pap History of obstructive sleep apnea on CPAP which she benefits from  Aortic stenosis, mild History of mild aortic  stenosis 2D echo performed 05/20/2018 with a peak gradient of 22 mmHg and a valve area 9 cm.  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

## 2018-05-28 NOTE — Assessment & Plan Note (Signed)
History of mild aortic stenosis 2D echo performed 05/20/2018 with a peak gradient of 22 mmHg and a valve area 9 cm.  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

## 2018-05-28 NOTE — Assessment & Plan Note (Signed)
She of dyslipidemia on statin therapy recent lipid profile performed by his PCP 02/19/2018 revealing total cholesterol 157 LDL 72 HDL of 37 and triglyceride level of 42 which is significantly improved from prior readings.

## 2018-05-28 NOTE — Patient Instructions (Signed)
Medication Instructions: Your physician recommends that you continue on your current medications as directed. Please refer to the Current Medication list given to you today.   Testing/Procedures: Your physician has requested that you have an echocardiogram in 1 year. Echocardiography is a painless test that uses sound waves to create images of your heart. It provides your doctor with information about the size and shape of your heart and how well your heart's chambers and valves are working. This procedure takes approximately one hour. There are no restrictions for this procedure.  Follow-Up: Your physician wants you to follow-up in: 1 year with Dr. Berry--after echo. You will receive a reminder letter in the mail two months in advance. If you don't receive a letter, please call our office to schedule the follow-up appointment.  If you need a refill on your cardiac medications before your next appointment, please call your pharmacy.  

## 2018-05-28 NOTE — Assessment & Plan Note (Signed)
History of CAD status post stenting of the circumflex and RCA by myself in the past with last cath 03/02/2012 revealing patent stents 40 to 50% mid AV groove circumflex stenosis and proximal LAD stenosis and normal LV function.  He does have mild effort angina when walking back up a hill does not change in frequency or severity.

## 2018-05-28 NOTE — Assessment & Plan Note (Signed)
History of obstructive sleep apnea on CPAP which she benefits from 

## 2018-08-11 DIAGNOSIS — I619 Nontraumatic intracerebral hemorrhage, unspecified: Secondary | ICD-10-CM

## 2018-08-11 HISTORY — DX: Nontraumatic intracerebral hemorrhage, unspecified: I61.9

## 2018-08-22 ENCOUNTER — Emergency Department (HOSPITAL_COMMUNITY): Payer: Medicare Other

## 2018-08-22 ENCOUNTER — Inpatient Hospital Stay (HOSPITAL_COMMUNITY)
Admission: EM | Admit: 2018-08-22 | Discharge: 2018-08-23 | DRG: 087 | Disposition: A | Discharge status: Home / Self Care | Payer: Medicare Other | Attending: Pulmonary Disease | Admitting: Pulmonary Disease

## 2018-08-22 ENCOUNTER — Other Ambulatory Visit: Payer: Self-pay

## 2018-08-22 ENCOUNTER — Encounter (HOSPITAL_COMMUNITY): Payer: Self-pay

## 2018-08-22 DIAGNOSIS — N183 Chronic kidney disease, stage 3 (moderate): Secondary | ICD-10-CM | POA: Diagnosis present

## 2018-08-22 DIAGNOSIS — Z7951 Long term (current) use of inhaled steroids: Secondary | ICD-10-CM | POA: Diagnosis not present

## 2018-08-22 DIAGNOSIS — G4733 Obstructive sleep apnea (adult) (pediatric): Secondary | ICD-10-CM | POA: Diagnosis present

## 2018-08-22 DIAGNOSIS — E1122 Type 2 diabetes mellitus with diabetic chronic kidney disease: Secondary | ICD-10-CM | POA: Diagnosis present

## 2018-08-22 DIAGNOSIS — K219 Gastro-esophageal reflux disease without esophagitis: Secondary | ICD-10-CM | POA: Diagnosis present

## 2018-08-22 DIAGNOSIS — M1990 Unspecified osteoarthritis, unspecified site: Secondary | ICD-10-CM | POA: Diagnosis present

## 2018-08-22 DIAGNOSIS — I10 Essential (primary) hypertension: Secondary | ICD-10-CM

## 2018-08-22 DIAGNOSIS — W1789XA Other fall from one level to another, initial encounter: Secondary | ICD-10-CM | POA: Diagnosis present

## 2018-08-22 DIAGNOSIS — J329 Chronic sinusitis, unspecified: Secondary | ICD-10-CM | POA: Diagnosis present

## 2018-08-22 DIAGNOSIS — I480 Paroxysmal atrial fibrillation: Secondary | ICD-10-CM | POA: Diagnosis present

## 2018-08-22 DIAGNOSIS — E1121 Type 2 diabetes mellitus with diabetic nephropathy: Secondary | ICD-10-CM | POA: Diagnosis present

## 2018-08-22 DIAGNOSIS — S065XAA Traumatic subdural hemorrhage with loss of consciousness status unknown, initial encounter: Secondary | ICD-10-CM

## 2018-08-22 DIAGNOSIS — Z955 Presence of coronary angioplasty implant and graft: Secondary | ICD-10-CM

## 2018-08-22 DIAGNOSIS — W19XXXA Unspecified fall, initial encounter: Secondary | ICD-10-CM

## 2018-08-22 DIAGNOSIS — E7800 Pure hypercholesterolemia, unspecified: Secondary | ICD-10-CM | POA: Diagnosis present

## 2018-08-22 DIAGNOSIS — Z981 Arthrodesis status: Secondary | ICD-10-CM

## 2018-08-22 DIAGNOSIS — Y990 Civilian activity done for income or pay: Secondary | ICD-10-CM | POA: Diagnosis not present

## 2018-08-22 DIAGNOSIS — E785 Hyperlipidemia, unspecified: Secondary | ICD-10-CM | POA: Diagnosis present

## 2018-08-22 DIAGNOSIS — S065X1A Traumatic subdural hemorrhage with loss of consciousness of 30 minutes or less, initial encounter: Principal | ICD-10-CM | POA: Diagnosis present

## 2018-08-22 DIAGNOSIS — Z888 Allergy status to other drugs, medicaments and biological substances status: Secondary | ICD-10-CM

## 2018-08-22 DIAGNOSIS — Z7982 Long term (current) use of aspirin: Secondary | ICD-10-CM | POA: Diagnosis not present

## 2018-08-22 DIAGNOSIS — I609 Nontraumatic subarachnoid hemorrhage, unspecified: Secondary | ICD-10-CM | POA: Diagnosis not present

## 2018-08-22 DIAGNOSIS — S066X1A Traumatic subarachnoid hemorrhage with loss of consciousness of 30 minutes or less, initial encounter: Secondary | ICD-10-CM | POA: Diagnosis present

## 2018-08-22 DIAGNOSIS — Z7901 Long term (current) use of anticoagulants: Secondary | ICD-10-CM

## 2018-08-22 DIAGNOSIS — I160 Hypertensive urgency: Secondary | ICD-10-CM | POA: Diagnosis present

## 2018-08-22 DIAGNOSIS — I129 Hypertensive chronic kidney disease with stage 1 through stage 4 chronic kidney disease, or unspecified chronic kidney disease: Secondary | ICD-10-CM | POA: Diagnosis present

## 2018-08-22 DIAGNOSIS — S069X1A Unspecified intracranial injury with loss of consciousness of 30 minutes or less, initial encounter: Secondary | ICD-10-CM

## 2018-08-22 DIAGNOSIS — S065X9A Traumatic subdural hemorrhage with loss of consciousness of unspecified duration, initial encounter: Secondary | ICD-10-CM | POA: Diagnosis not present

## 2018-08-22 DIAGNOSIS — I2510 Atherosclerotic heart disease of native coronary artery without angina pectoris: Secondary | ICD-10-CM | POA: Diagnosis present

## 2018-08-22 LAB — MRSA PCR SCREENING: MRSA by PCR: NEGATIVE

## 2018-08-22 LAB — COMPREHENSIVE METABOLIC PANEL
ALBUMIN: 4 g/dL (ref 3.5–5.0)
ALT: 24 U/L (ref 0–44)
AST: 31 U/L (ref 15–41)
Alkaline Phosphatase: 49 U/L (ref 38–126)
Anion gap: 11 (ref 5–15)
BUN: 22 mg/dL (ref 8–23)
CHLORIDE: 106 mmol/L (ref 98–111)
CO2: 22 mmol/L (ref 22–32)
Calcium: 9.9 mg/dL (ref 8.9–10.3)
Creatinine, Ser: 1.49 mg/dL — ABNORMAL HIGH (ref 0.61–1.24)
GFR calc Af Amer: 54 mL/min — ABNORMAL LOW (ref >60)
GFR calc non Af Amer: 46 mL/min — ABNORMAL LOW (ref >60)
GLUCOSE: 168 mg/dL — AB (ref 70–99)
POTASSIUM: 3.9 mmol/L (ref 3.5–5.1)
Sodium: 139 mmol/L (ref 135–145)
Total Bilirubin: 0.7 mg/dL (ref 0.3–1.2)
Total Protein: 7.6 g/dL (ref 6.5–8.1)

## 2018-08-22 LAB — I-STAT CHEM 8, ED
BUN: 24 mg/dL — ABNORMAL HIGH (ref 8–23)
CHLORIDE: 107 mmol/L (ref 98–111)
CREATININE: 1.5 mg/dL — AB (ref 0.61–1.24)
Calcium, Ion: 1.16 mmol/L (ref 1.15–1.40)
GLUCOSE: 169 mg/dL — AB (ref 70–99)
HCT: 42 % (ref 39.0–52.0)
Hemoglobin: 14.3 g/dL (ref 13.0–17.0)
POTASSIUM: 3.9 mmol/L (ref 3.5–5.1)
Sodium: 141 mmol/L (ref 135–145)
TCO2: 23 mmol/L (ref 22–32)

## 2018-08-22 LAB — CBC WITH DIFFERENTIAL/PLATELET
ABS IMMATURE GRANULOCYTES: 0 10*3/uL (ref 0.0–0.1)
BASOS PCT: 0 %
Basophils Absolute: 0 10*3/uL (ref 0.0–0.1)
Eosinophils Absolute: 0.2 10*3/uL (ref 0.0–0.7)
Eosinophils Relative: 2 %
HEMATOCRIT: 42.7 % (ref 39.0–52.0)
Hemoglobin: 13.9 g/dL (ref 13.0–17.0)
Immature Granulocytes: 0 %
Lymphocytes Relative: 16 %
Lymphs Abs: 1.2 10*3/uL (ref 0.7–4.0)
MCH: 29.4 pg (ref 26.0–34.0)
MCHC: 32.6 g/dL (ref 30.0–36.0)
MCV: 90.5 fL (ref 78.0–100.0)
MONO ABS: 0.7 10*3/uL (ref 0.1–1.0)
MONOS PCT: 9 %
NEUTROS ABS: 5.3 10*3/uL (ref 1.7–7.7)
Neutrophils Relative %: 73 %
PLATELETS: 229 10*3/uL (ref 150–400)
RBC: 4.72 MIL/uL (ref 4.22–5.81)
RDW: 13.2 % (ref 11.5–15.5)
WBC: 7.3 10*3/uL (ref 4.0–10.5)

## 2018-08-22 LAB — GLUCOSE, CAPILLARY: GLUCOSE-CAPILLARY: 106 mg/dL — AB (ref 70–99)

## 2018-08-22 LAB — I-STAT TROPONIN, ED: TROPONIN I, POC: 0.03 ng/mL (ref 0.00–0.08)

## 2018-08-22 LAB — CBC
HEMATOCRIT: 42.3 % (ref 39.0–52.0)
Hemoglobin: 13.7 g/dL (ref 13.0–17.0)
MCH: 29.5 pg (ref 26.0–34.0)
MCHC: 32.4 g/dL (ref 30.0–36.0)
MCV: 91 fL (ref 78.0–100.0)
Platelets: 244 10*3/uL (ref 150–400)
RBC: 4.65 MIL/uL (ref 4.22–5.81)
RDW: 13.5 % (ref 11.5–15.5)
WBC: 8.3 10*3/uL (ref 4.0–10.5)

## 2018-08-22 LAB — APTT: APTT: 37 s — AB (ref 24–36)

## 2018-08-22 LAB — PROTIME-INR
INR: 1.15
Prothrombin Time: 14.6 seconds (ref 11.4–15.2)

## 2018-08-22 IMAGING — CT CT ABD-PELV W/ CM
2 of 5 series · 12 of 36 positions shown, 15 images · IV contrast (Omni 300)
Comparison: None.

CLINICAL DATA: Initial evaluation for acute trauma, fall. Right
flank pain.

EXAM:
CT CHEST, ABDOMEN, AND PELVIS WITH CONTRAST
TECHNIQUE: Multidetector CT imaging of the chest, abdomen and pelvis was
performed following the standard protocol during bolus
administration of intravenous contrast.
CONTRAST:  100mL [SR] IOPAMIDOL ([SR]) INJECTION 61%

[Series 3: cap with 5mm st · axial · 0.88mm/px · z∈[-960,-325]mm · 9 of 153 slices shown, 12 images]
[im 13/153  mediastinal]
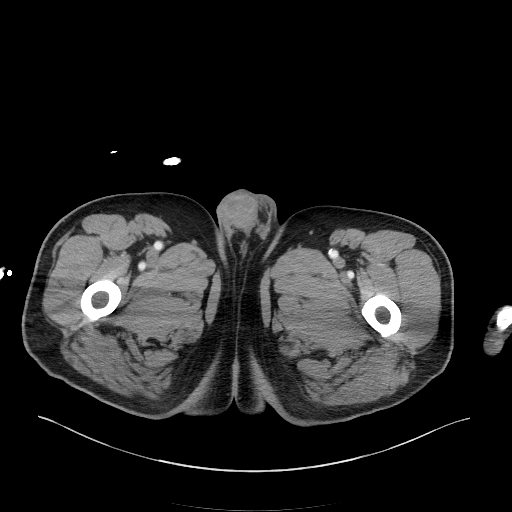
[im 13/153  lung]
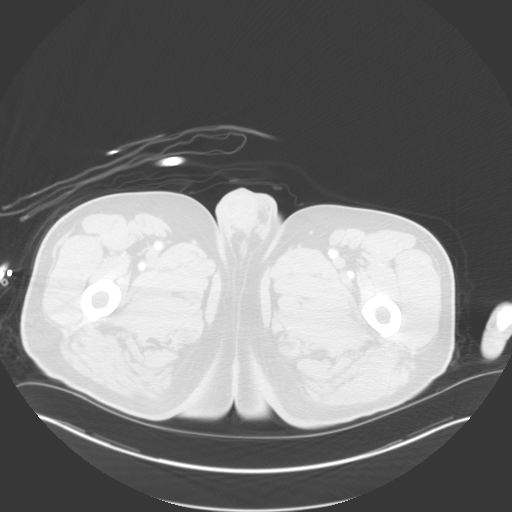
[im 26/153  lung]
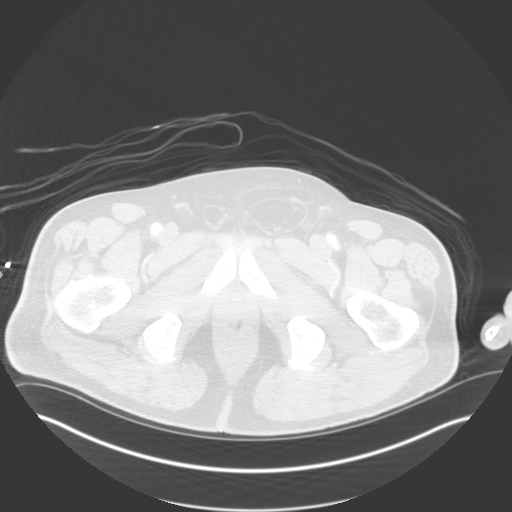
[im 51/153  lung]
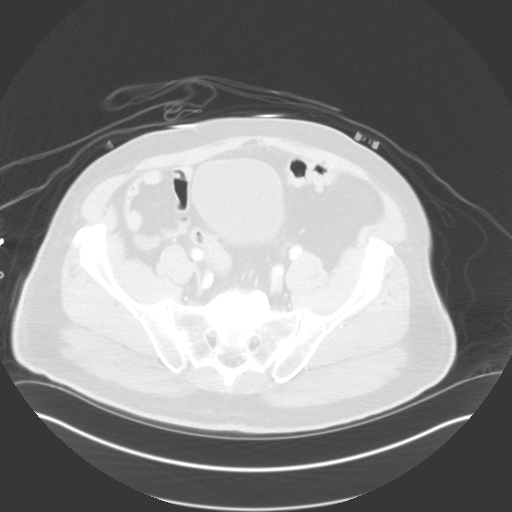
[im 64/153  lung]
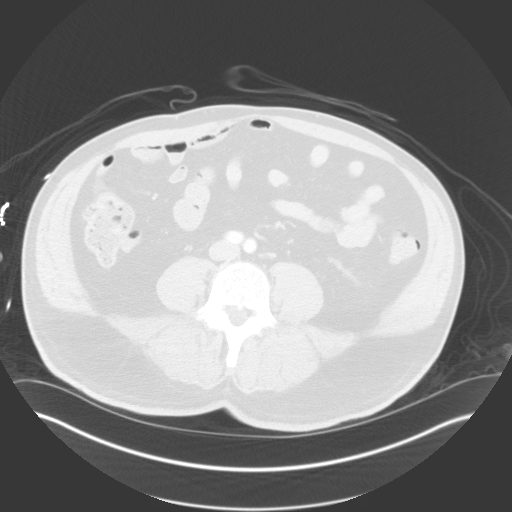
[im 77/153  mediastinal]
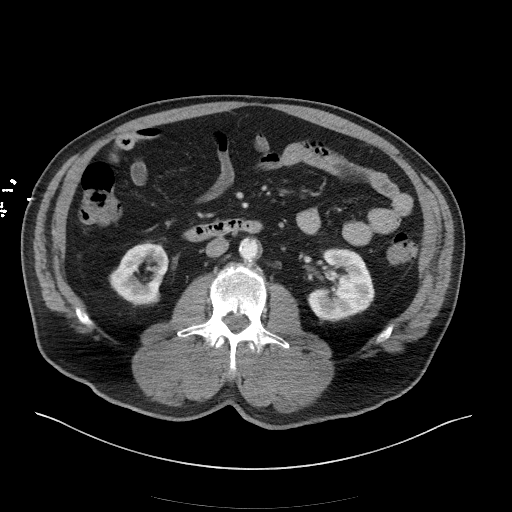
[im 77/153  lung]
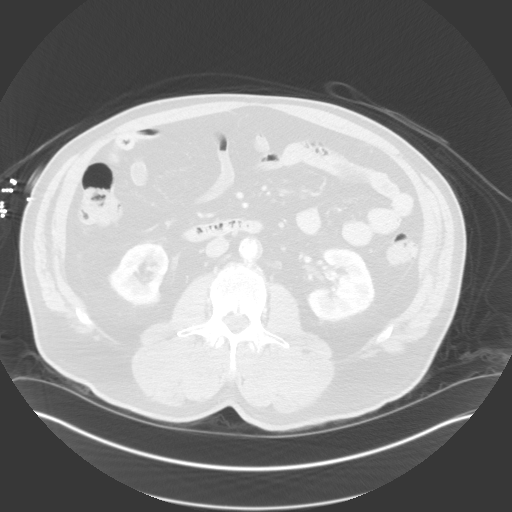
[im 89/153  lung]
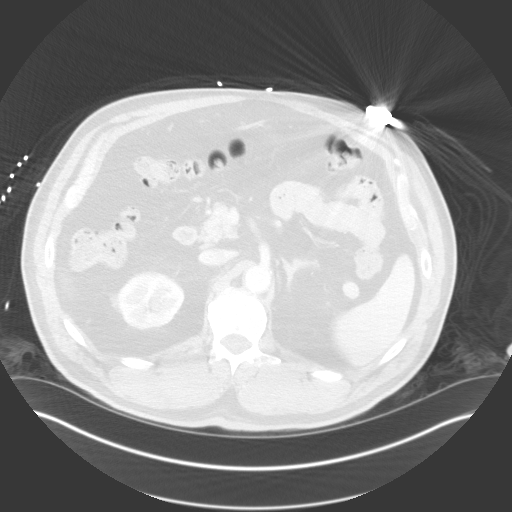
[im 102/153  lung]
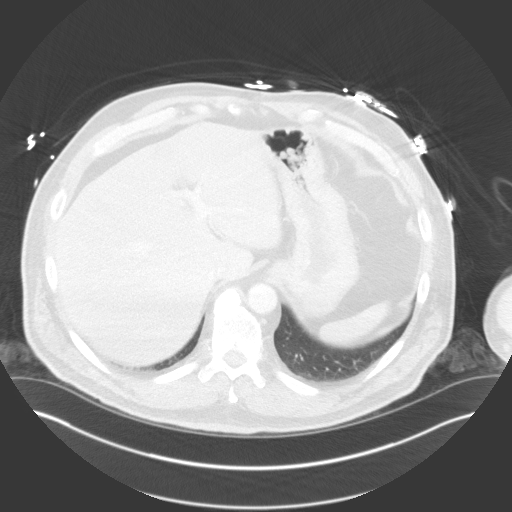
[im 127/153  lung]
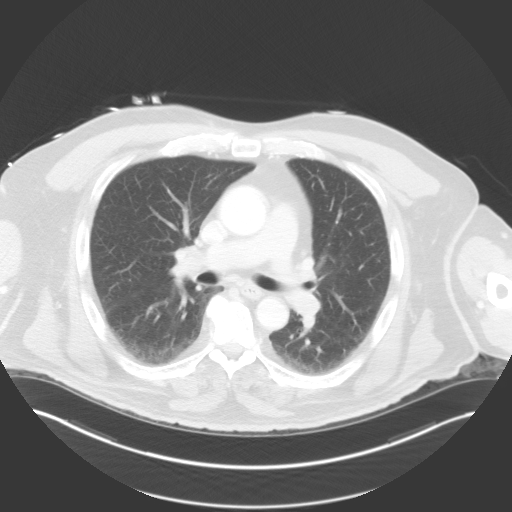
[im 140/153  mediastinal]
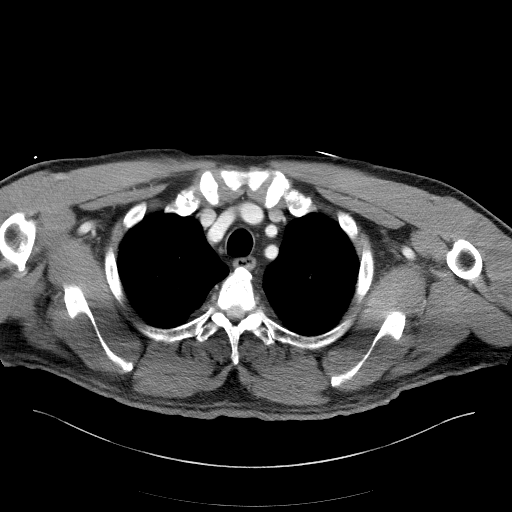
[im 140/153  lung]
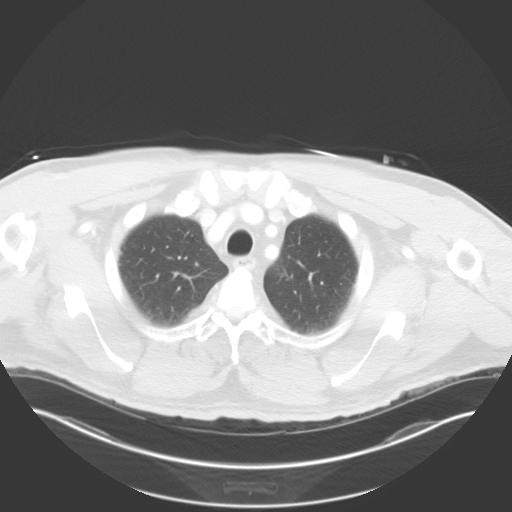

[Series 6: cap with 3mm st cor · coronal · 0.83mm/px · 3 of 168 slices shown]
[im 34/168  lung]
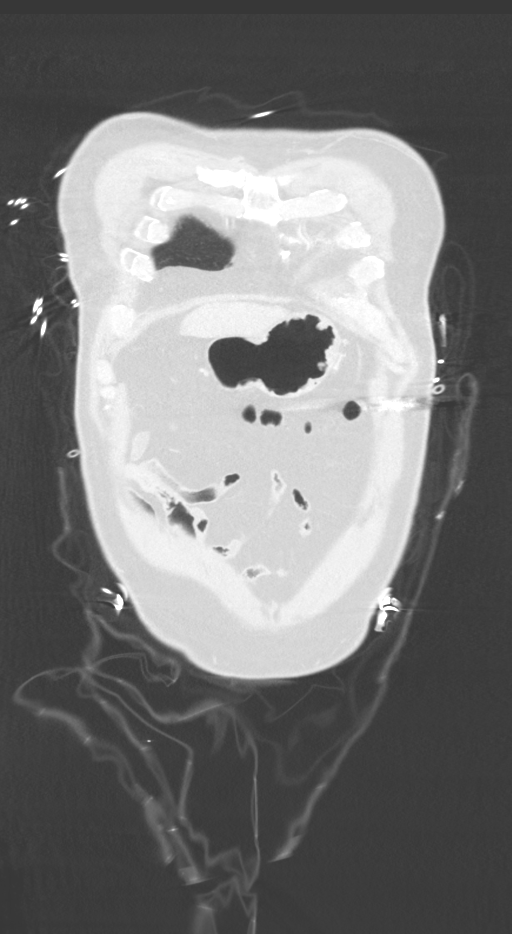
[im 67/168  lung]
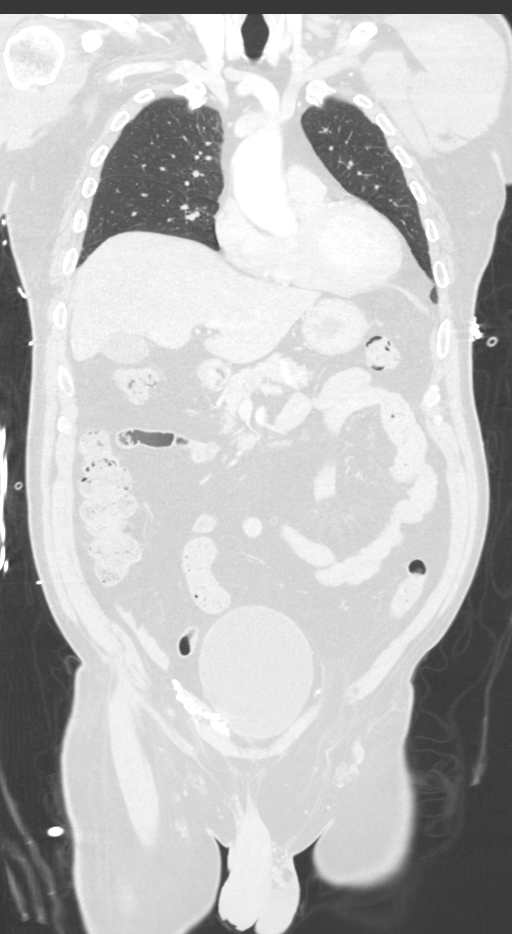
[im 101/168  lung]
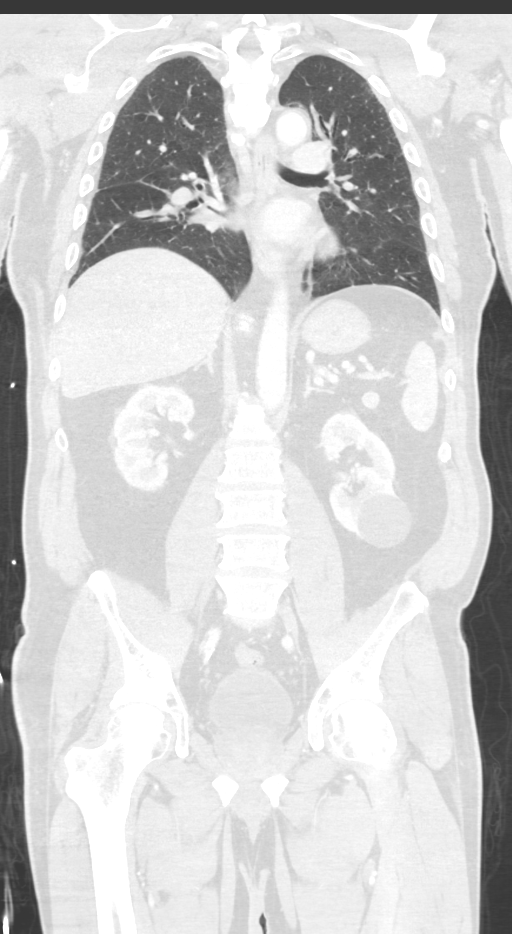

[12 of 36 positions shown; findings below may reference images not displayed]

FINDINGS: CT CHEST FINDINGS

Cardiovascular: Intrathoracic aorta of normal caliber without
aneurysm or other acute abnormality. Mild atheromatous plaque within
the descending intrathoracic aorta. Visualized great vessels intact
and normal. Heart size within normal limits. Diffuse 3 vessel
coronary artery calcifications with aortic valvular calcifications.
No pericardial effusion. Limited evaluation of the pulmonary
arterial tree unremarkable.

Mediastinum/Nodes: Thyroid normal. No enlarged mediastinal, hilar,
or axillary lymph nodes. Esophagus within normal limits.

Lungs/Pleura: Tracheobronchial tree intact and patent. Lungs well
inflated bilaterally. Mild subsegmental atelectatic changes noted
dependently within the lower lobes bilaterally. No focal infiltrates
or evidence for contusion. No pulmonary edema or pleural effusion.
No pneumothorax. No worrisome pulmonary nodule or mass.

Musculoskeletal: External soft tissues demonstrate no acute finding.
No acute osseous abnormality. No discrete lytic or blastic osseous
lesions. Cervical fusion partially visualized.

CT ABDOMEN PELVIS FINDINGS

Hepatobiliary: Liver demonstrates a normal contrast enhanced
appearance. Gallbladder within normal limits. No biliary dilatation.

Pancreas: Pancreas within normal limits.

Spleen: Spleen intact and normal.

Adrenals/Urinary Tract: Adrenal glands within normal limits. Kidneys
equal size with symmetric enhancement. 5 cm cyst noted at the lower
pole of the left kidney. Dish ule 1 cm cyst present at the left
upper pole. No nephrolithiasis or hydronephrosis. No focal enhancing
renal mass. No hydroureter. Partially distended bladder within
normal limits.

Stomach/Bowel: Stomach within normal limits. No evidence for bowel
obstruction. No acute bowel injury. Mild colonic diverticulosis
without evidence for acute diverticulitis. Normal appendix. No acute
inflammatory changes about the bowels.

Vascular/Lymphatic: Normal intravascular enhancement seen throughout
the intra-abdominal aorta. Moderate aortic atherosclerosis without
aneurysm. Mesenteric vessels patent proximally. No adenopathy.

Reproductive: Prostate normal.

Other: Moderate-sized fat containing left inguinal hernia. Sequelae
of prior right inguinal hernia repair with small residual and/or
recurrent fat containing hernia. No free air or fluid. No mesenteric
or retroperitoneal hematoma.

Musculoskeletal: External soft tissues within normal limits. No
acute fracture or other osseous abnormality.
IMPRESSION: 1. No CT evidence for acute traumatic injury within the chest,
abdomen, and pelvis.
2. No other acute abnormality identified.
3. Mild colonic diverticulosis without evidence for acute
diverticulitis.

## 2018-08-22 IMAGING — CT CT CERVICAL SPINE W/O CM
5 of 8 series · 11 of 33 positions shown, 12 images · non-contrast
Comparison: None.

CLINICAL DATA: Head injury after fall. Positive loss of
consciousness.

EXAM:
CT HEAD WITHOUT CONTRAST
CT CERVICAL SPINE WITHOUT CONTRAST
TECHNIQUE: Multidetector CT imaging of the head and cervical spine was
performed following the standard protocol without intravenous
contrast. Multiplanar CT image reconstructions of the cervical spine
were also generated.

[Series 5: head bone · axial · 0.43mm/px · z∈[-86,-32]mm · 2 of 83 slices shown]
[im 28/83  bone]
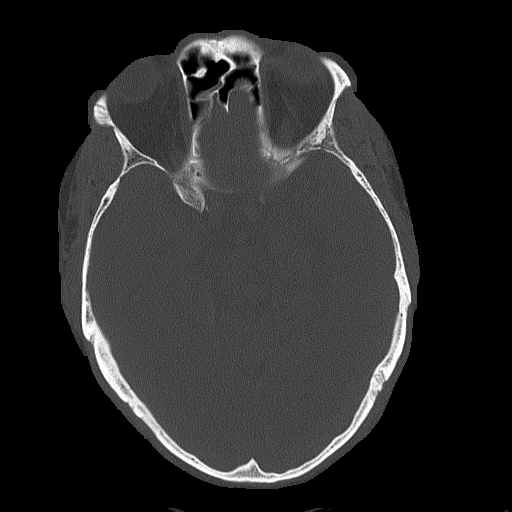
[im 55/83  bone]
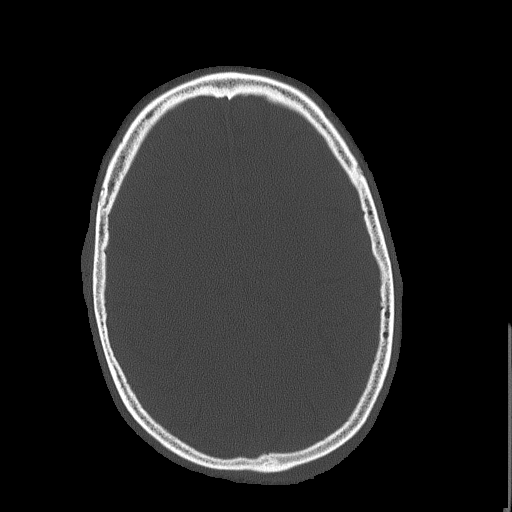

[Series 8: c_spine 2.0 st · axial · 0.27mm/px · z∈[-258,-200]mm · 2 of 88 slices shown, 3 images]
[im 30/88  soft-tissue]
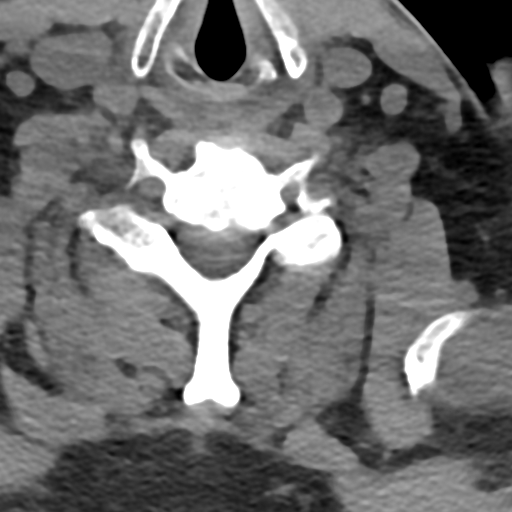
[im 30/88  bone]
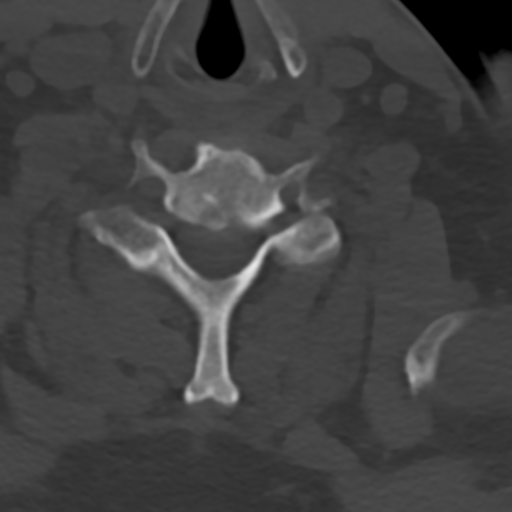
[im 59/88  bone]
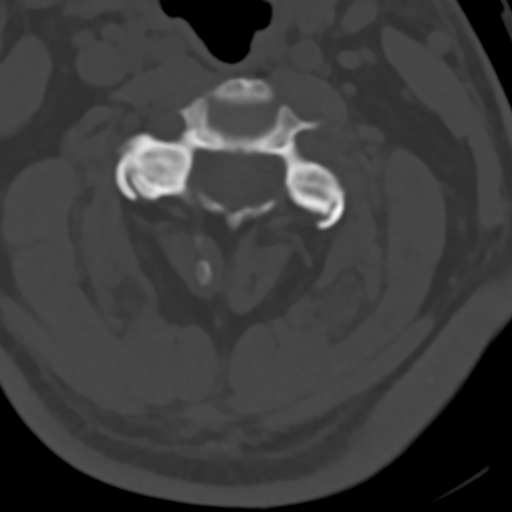

[Series 12: c_spine 2.0 sag bone · sagittal · 0.26mm/px · 4 of 61 slices shown]
[im 13/61  bone]
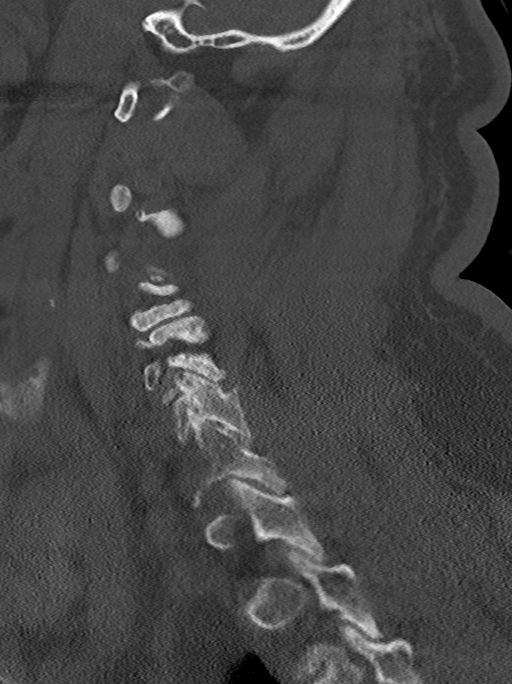
[im 25/61  bone]
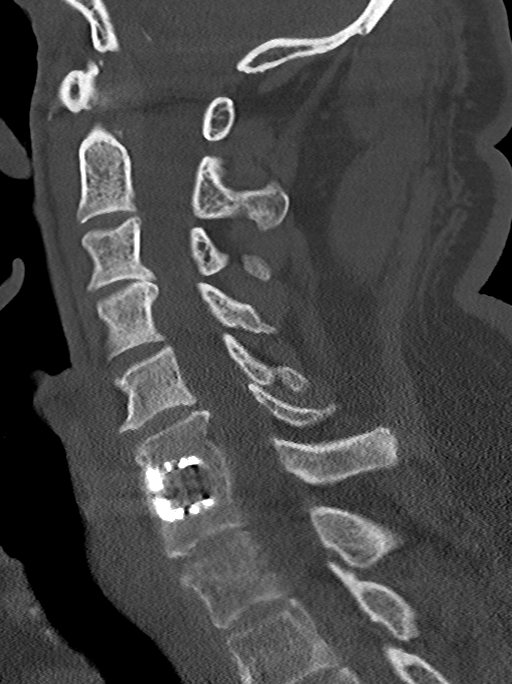
[im 37/61  bone]
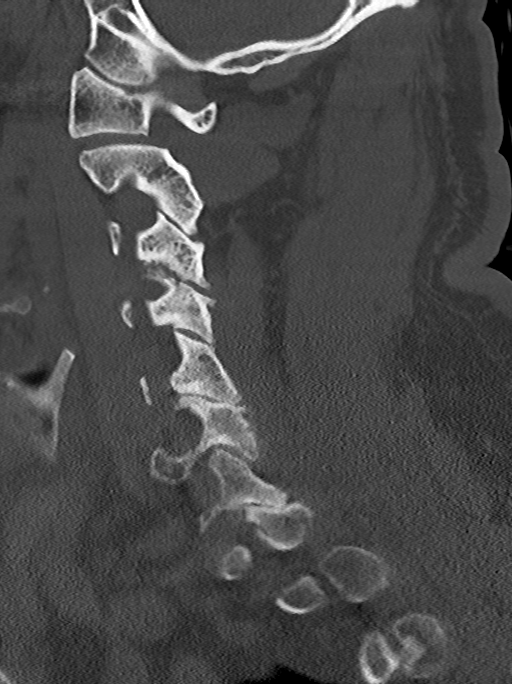
[im 49/61  bone]
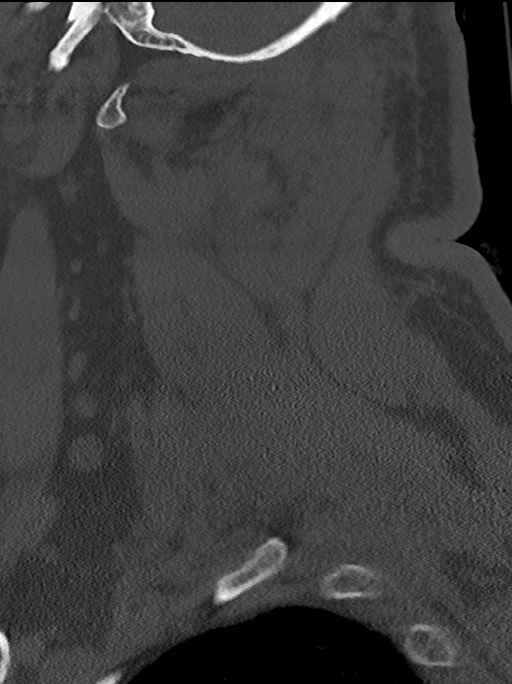

[Series 13: c_spine 2.0 cor bone · coronal · 0.26mm/px · 1 of 61 slices shown]
[im 31/61  bone]
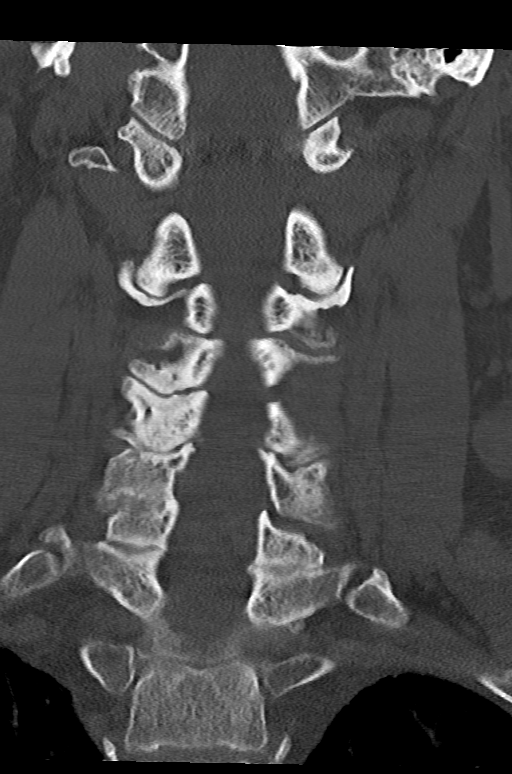

[Series 14: c_spine 2.0 orthogonals · axial · 0.21mm/px · z∈[-272,-218]mm · 2 of 92 slices shown]
[im 31/92  bone]
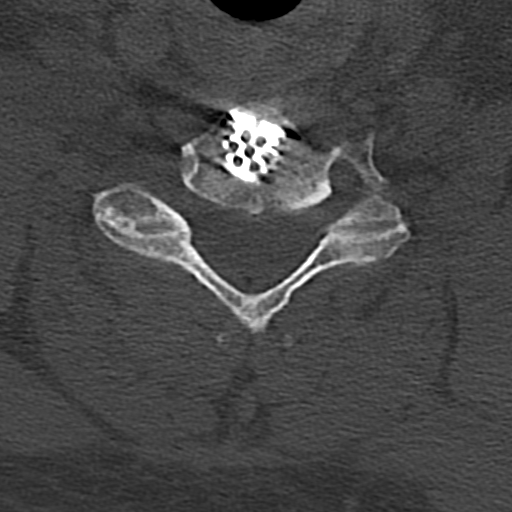
[im 61/92  bone]
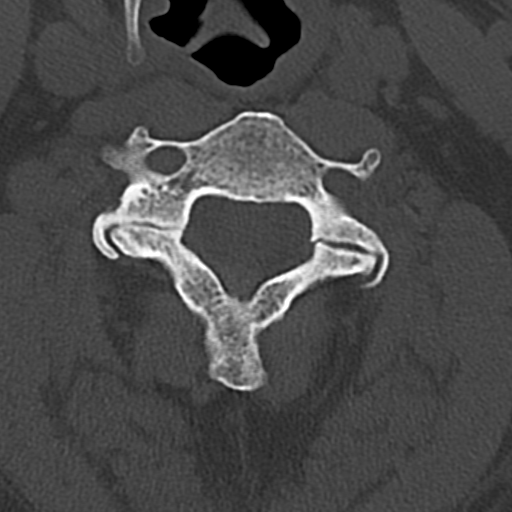

[11 of 33 positions shown; findings below may reference images not displayed]

FINDINGS: CT HEAD FINDINGS

Brain: Moderate size posterior scalp hematoma is noted. Small
right-sided tentorial subdural hematoma is noted. No mass effect or
midline shift is noted. Ventricular size is within normal limits.
Small amount of left frontal subarachnoid hemorrhage is noted as
well. There is no evidence of mass lesion or acute infarction.

Vascular: No hyperdense vessel or unexpected calcification.

Skull: Normal. Negative for fracture or focal lesion.

Sinuses/Orbits: No acute finding.

Other: Fluid is noted in bilateral mastoid air cells.

CT CERVICAL SPINE FINDINGS

Alignment: Normal.

Skull base and vertebrae: No acute fracture. No primary bone lesion
or focal pathologic process.

Soft tissues and spinal canal: No prevertebral fluid or swelling. No
visible canal hematoma.

Disc levels: Status post surgical fusion of C6-7 disc space. Mild
anterior osteophyte formation is noted at C4-5 and C5-6.

Upper chest: Negative.

Other: Degenerative changes are seen involving posterior facet
joints bilaterally.
IMPRESSION: Small right-sided tentorial subdural hematoma is noted as well as
small focus of left frontal subarachnoid hemorrhage. Critical
Value/emergent results were called by telephone at the time of
interpretation on [DATE] at [DATE] to Dr. POLIN ,
who verbally acknowledged these results.

Moderate size posterior scalp hematoma.

Postsurgical and degenerative changes noted in the cervical spine.
No fracture or spondylolisthesis is noted.

## 2018-08-22 MED ORDER — IOPAMIDOL (ISOVUE-300) INJECTION 61%
100.0000 mL | Freq: Once | INTRAVENOUS | Status: AC | PRN
  Administered 2018-08-22: 100 mL via INTRAVENOUS

## 2018-08-22 MED ORDER — SODIUM CHLORIDE 0.9 % IV SOLN
INTRAVENOUS | Status: DC
  Administered 2018-08-23: 04:00:00 via INTRAVENOUS

## 2018-08-22 MED ORDER — AMOXICILLIN-POT CLAVULANATE 875-125 MG PO TABS
1.0000 | ORAL_TABLET | Freq: Two times a day (BID) | ORAL | Status: DC
  Administered 2018-08-22 – 2018-08-23 (×2): 1 via ORAL
  Filled 2018-08-22 (×3): qty 1

## 2018-08-22 MED ORDER — HYDRALAZINE HCL 20 MG/ML IJ SOLN
20.0000 mg | Freq: Once | INTRAMUSCULAR | Status: AC
  Administered 2018-08-22: 20 mg via INTRAVENOUS
  Filled 2018-08-22: qty 1

## 2018-08-22 MED ORDER — ACETAMINOPHEN 650 MG RE SUPP: 650.0000 mg | RECTAL | Status: DC | PRN

## 2018-08-22 MED ORDER — NIMODIPINE 60 MG/20ML PO SOLN: 60.0000 mg | ORAL | Status: DC

## 2018-08-22 MED ORDER — ONDANSETRON HCL 4 MG/2ML IJ SOLN
4.0000 mg | Freq: Once | INTRAMUSCULAR | Status: AC
  Administered 2018-08-22: 4 mg via INTRAVENOUS
  Filled 2018-08-22: qty 2

## 2018-08-22 MED ORDER — ACETAMINOPHEN 160 MG/5ML PO SOLN: 650.0000 mg | ORAL | Status: DC | PRN

## 2018-08-22 MED ORDER — ATORVASTATIN CALCIUM 20 MG PO TABS
40.0000 mg | ORAL_TABLET | Freq: Every day | ORAL | Status: DC
  Administered 2018-08-23: 40 mg via ORAL
  Filled 2018-08-22: qty 2

## 2018-08-22 MED ORDER — ONDANSETRON HCL 4 MG/2ML IJ SOLN: 4.0000 mg | Freq: Four times a day (QID) | INTRAMUSCULAR | Status: DC | PRN

## 2018-08-22 MED ORDER — NIMODIPINE 30 MG PO CAPS
60.0000 mg | ORAL_CAPSULE | ORAL | Status: DC
  Administered 2018-08-22 – 2018-08-23 (×3): 60 mg via ORAL
  Filled 2018-08-22 (×3): qty 2

## 2018-08-22 MED ORDER — INSULIN ASPART 100 UNIT/ML ~~LOC~~ SOLN: 0.0000 [IU] | Freq: Every day | SUBCUTANEOUS | Status: DC

## 2018-08-22 MED ORDER — SODIUM CHLORIDE 0.9 % IV BOLUS
500.0000 mL | Freq: Once | INTRAVENOUS | Status: AC
  Administered 2018-08-22: 500 mL via INTRAVENOUS

## 2018-08-22 MED ORDER — FLUTICASONE PROPIONATE 50 MCG/ACT NA SUSP
1.0000 | Freq: Every day | NASAL | Status: DC
  Filled 2018-08-22: qty 16

## 2018-08-22 MED ORDER — ACETAMINOPHEN 500 MG PO TABS
1000.0000 mg | ORAL_TABLET | Freq: Once | ORAL | Status: AC
  Administered 2018-08-22: 1000 mg via ORAL
  Filled 2018-08-22: qty 2

## 2018-08-22 MED ORDER — STROKE: EARLY STAGES OF RECOVERY BOOK
Freq: Once | Status: DC
  Filled 2018-08-22: qty 1

## 2018-08-22 MED ORDER — ONDANSETRON 4 MG PO TBDP: 4.0000 mg | ORAL_TABLET | Freq: Four times a day (QID) | ORAL | Status: DC | PRN

## 2018-08-22 MED ORDER — NICARDIPINE HCL IN NACL 20-0.86 MG/200ML-% IV SOLN
0.0000 mg/h | INTRAVENOUS | Status: DC
  Administered 2018-08-22: 7.5 mg/h via INTRAVENOUS
  Administered 2018-08-22: 5 mg/h via INTRAVENOUS
  Administered 2018-08-22 – 2018-08-23 (×2): 7.5 mg/h via INTRAVENOUS
  Filled 2018-08-22 (×5): qty 200

## 2018-08-22 MED ORDER — PANTOPRAZOLE SODIUM 40 MG PO PACK: 40.0000 mg | PACK | Freq: Every day | ORAL | Status: DC

## 2018-08-22 MED ORDER — SALINE SPRAY 0.65 % NA SOLN
2.0000 | Freq: Two times a day (BID) | NASAL | Status: DC
  Administered 2018-08-22: 2 via NASAL
  Filled 2018-08-22: qty 44

## 2018-08-22 MED ORDER — ACETAMINOPHEN 325 MG PO TABS: 650.0000 mg | ORAL_TABLET | ORAL | Status: DC | PRN

## 2018-08-22 MED ORDER — INSULIN ASPART 100 UNIT/ML ~~LOC~~ SOLN
0.0000 [IU] | Freq: Three times a day (TID) | SUBCUTANEOUS | Status: DC
  Administered 2018-08-23: 7 [IU] via SUBCUTANEOUS
  Administered 2018-08-23: 4 [IU] via SUBCUTANEOUS

## 2018-08-22 MED ORDER — FENTANYL CITRATE (PF) 100 MCG/2ML IJ SOLN
50.0000 ug | Freq: Once | INTRAMUSCULAR | Status: AC
  Administered 2018-08-22: 50 ug via INTRAVENOUS
  Filled 2018-08-22: qty 2

## 2018-08-22 MED ORDER — DOCUSATE SODIUM 100 MG PO CAPS
100.0000 mg | ORAL_CAPSULE | Freq: Two times a day (BID) | ORAL | Status: DC
  Administered 2018-08-22: 100 mg via ORAL
  Filled 2018-08-22 (×2): qty 1

## 2018-08-22 MED ORDER — PANTOPRAZOLE SODIUM 40 MG PO TBEC
40.0000 mg | DELAYED_RELEASE_TABLET | Freq: Every day | ORAL | Status: DC
  Administered 2018-08-23: 40 mg via ORAL
  Filled 2018-08-22: qty 1

## 2018-08-22 NOTE — H&P (Addendum)
PULMONARY / CRITICAL CARE MEDICINE   NAME:  Jason Neal, MRN:  [id number], DOB:  29-Apr-1951, LOS: 0 ADMISSION DATE:  08/22/2018, CONSULTATION DATE:  08/22/2018 REFERRING MD:  Dr. Alvino Chapel, ER, CHIEF COMPLAINT:  Headache  BRIEF HISTORY:    67 yo male with traumatic Rt SDH and Lt frontal SAH after falling off a tractor.  Noted to have elevated BP and started on cardene gtt.  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  He was signally for the driver to stop.  He then fell off the tractor.  That is the last thing he remembers before seeing EMS over top of him.  This was about 5 to 10 minutes.  No reported seizures.  He has a headache and stiff neck.  Not having blurred vision, vertigo, nausea, chest pain, dyspnea, weakness, or numbness.  He has been treated with several rounds of antibiotics for chronic sinusitis.  His symptoms have persisted.  He was noted to have BP 186/86 in ER and started on cardene gtt.  He was seen by neurosurgery.  He takes xarelto for A fib.  His last dose was more than 24 hours ago, and therefore didn't feel necessary to have reversal of xarelto.  SIGNIFICANT PAST MEDICAL HISTORY   OSA, HTN, DM, CKD 3, PAF on xarelto, HLD, GERD, CAD, mild AS  SIGNIFICANT EVENTS:  9/12 Admit  STUDIES:   CT head 9/12 >> posterior scalp hematoma, small Rt tentorial SDH, small Lt frontal SAH (reviewed by me) CT neck 9/12 >> fusion C6-7 (reviewed by me)  CULTURES:    ANTIBIOTICS:  Augmentin 9/12 >>   LINES/TUBES:    CONSULTANTS:  Neurosurgery 9/12 SAH, SDH >>  SUBJECTIVE:    CONSTITUTIONAL: BP 135/60   Pulse 69   Temp 98.3 F (36.8 C) (Oral)   Resp 14   Ht 6' (1.829 m)   Wt 102.1 kg   SpO2 95%   BMI 30.52 kg/m   No intake/output data recorded.        PHYSICAL EXAM:  General - alert Eyes - pupils reactive ENT - no sinus tenderness, no stridor Cardiac - regular rate/rhythm, 2/6 SM Chest - equal breath sounds  b/l, no wheezing or rales Abdomen - soft, non tender, + bowel sounds, no hepatosplenomegaly GU - no lesions noted Extremities - no cyanosis, clubbing, or edema Skin - no rashes Lymphatics - no lymphadenopathy Neuro - CN intact, normal strength, moves extremities, follows commands Psych - normal mood and behavior   RESOLVED PROBLEM LIST   ASSESSMENT AND PLAN    Traumatic SDH, SAH. - monitor in ICU - neurosurgery following  Hypertension urgency. - cardene gtt for goal SBP < 140  Hx of CAD, A fib, HTN, HLD. - hold ASA, xarelto - lipitor - hold outpt antiHTN meds for now  Hx of OSA. - CPAP qhs - might need to change from nasal pillows mask due to recurrent sinusitis  DM type II. - SSI  CKD 3. - f/u BMET  Recurrent sinusitis. - will add augmentin to complete recent course of antibiotics - nasal irrigation, flonase    SUMMARY OF TODAY'S PLAN:  Admit to ICU, cardene gtt, monitor neuro exam.  Best Practice / Goals of Care / Disposition.   DVT PROPHYLAXIS: SCDs SUP: Protonix NUTRITION: clear liquids MOBILITY: bed rest GOALS OF CARE: full code FAMILY DISCUSSIONS:  Updated at bedside 9/12  LABS  Glucose No results for  input(s): GLUCAP in the last 168 hours.  BMET Recent Labs  Lab 08/22/18 1225 08/22/18 1239  NA 139 141  K 3.9 3.9  CL 106 107  CO2 22  --   BUN 22 24*  CREATININE 1.49* 1.50*  GLUCOSE 168* 169*    Liver Enzymes Recent Labs  Lab 08/22/18 1225  AST 31  ALT 24  ALKPHOS 49  BILITOT 0.7  ALBUMIN 4.0    Electrolytes Recent Labs  Lab 08/22/18 1225  CALCIUM 9.9    CBC Recent Labs  Lab 08/22/18 1225 08/22/18 1239  WBC 7.3  --   HGB 13.9 14.3  HCT 42.7 42.0  PLT 229  --     ABG No results for input(s): PHART, PCO2ART, PO2ART in the last 168 hours.  Coag's No results for input(s): APTT, INR in the last 168 hours.  Sepsis Markers No results for input(s): LATICACIDVEN, PROCALCITON, O2SATVEN in the last 168  hours.  Cardiac Enzymes No results for input(s): TROPONINI, PROBNP in the last 168 hours.  PAST MEDICAL HISTORY :   He  has a past medical history of CAD (coronary artery disease), Carotid bruit (03/14/11), Chronic renal insufficiency, stage III (moderate) ([place]), DJD (degenerative joint disease), Dyslipidemia, Dysrhythmia, GERD (gastroesophageal reflux disease), Hypercholesteremia, Hypertension, PAF (paroxysmal atrial fibrillation) [facility]) (March 2013), Sleep apnea, and Type 2 diabetes mellitus with chronic kidney disease ([place]).  PAST SURGICAL HISTORY:  He  has a past surgical history that includes Coronary angioplasty with stent (Dec 2011); C-spine surgery (2001); TEE without cardioversion (03/07/2012); Cardioversion (03/07/2012); cardiac catherization (03/04/12); left heart catheterization with coronary angiogram (N/A, 03/04/2012); and Colonoscopy with propofol (N/A, 01/05/2017).  Allergies  Allergen Reactions  . Angiotensin Receptor Blockers Cough  . Demeclocycline Other (See Comments)    No current facility-administered medications on file prior to encounter.    Current Outpatient Medications on File Prior to Encounter  Medication Sig  . aspirin EC 81 MG tablet Take 1 tablet (81 mg total) by mouth daily.  [name] atorvastatin (LIPITOR) 40 MG tablet Take 40 mg by mouth daily.  . B Complex-C (B-COMPLEX WITH VITAMIN C) tablet Take 1 tablet by mouth daily.  . calcium carbonate (TUMS - DOSED IN MG ELEMENTAL CALCIUM) 500 MG chewable tablet Chew 1 tablet by mouth daily as needed for indigestion or heartburn.  . carvedilol (COREG) 6.25 MG tablet Take 1 tablet by mouth 2 (two) times daily.  . cetirizine (ZYRTEC) 10 MG tablet Take 5 mg by mouth daily as needed for allergies.   . cholecalciferol (VITAMIN D) 1000 UNITS tablet Take 1,000-2,000 Units by mouth See admin instructions. 2000units in am, 1000 units in pm  . diltiazem (CARDIZEM SR) 90 MG 12 hr capsule TAKE ONE CAPSULE BY MOUTH TWICE A DAY (Patient  taking differently: Take 90 mg by mouth 2 (two) times daily. )  . fluticasone (FLONASE) 50 MCG/ACT nasal spray Place 1 spray into the nose as needed for allergies.   . hydrALAZINE (APRESOLINE) 50 MG tablet Take 50 mg by mouth 2 (two) times daily.  [name] losartan (COZAAR) 100 MG tablet TAKE 1 TABLET DAILY (Patient taking differently: Take 100 mg by mouth daily. )  . nitroGLYCERIN (NITROSTAT) 0.4 MG SL tablet Place 0.4 mg under the tongue every 5 (five) minutes as needed for chest pain.  [name] ofloxacin (FLOXIN) 0.3 % OTIC solution Place 1 drop into both ears daily.  [name] omega-3 acid ethyl esters (LOVAZA) 1 G capsule Take 1 g by mouth 2 (two) times daily.  [name]  pantoprazole (PROTONIX) 40 MG tablet Take 40 mg by mouth every morning.  Vladimir Faster Glycol-Propyl Glycol (SYSTANE) 0.4-0.3 % SOLN Place 1 drop into both eyes as needed (dry eyes).   . simethicone (GAS-X) 80 MG chewable tablet Chew 80 mg by mouth every 6 (six) hours as needed for flatulence.  Alveda Reasons 20 MG TABS tablet TAKE 1 TABLET DAILY (Patient taking differently: Take 20 mg by mouth daily. )  . ACCU-CHEK AVIVA PLUS test strip   . Lancets (ACCU-CHEK MULTICLIX) lancets     FAMILY HISTORY:   His family history is negative for falling off tractor.  SOCIAL HISTORY:  He  reports that he has never smoked. He has never used smokeless tobacco. He reports that he does not drink alcohol or use drugs.  REVIEW OF SYSTEMS:    Reviewed and negative except above  Chesley Mires, MD [facility] Pulmonary/Critical Care 08/22/2018, 6:04 PM

## 2018-08-22 NOTE — Consult Note (Signed)
Reason for Consult: [facility] Referring Physician: EDP  Jason Neal is an 67 y.o. male.   HPI:  67 year old gentleman on oral anticoagulant for fibrillation and status post coronary artery stenting for coronary artery disease who had a fall out of the back of a truck earlier today. [place] about 3 feet striking the back of his head. Positive loss of consciousness. Amnestic for the event. Has occipital headache rating to the temples. Pain is moderate. Pain is aching. No visual changes. No numbness tingling or weakness. No nausea and vomiting. CT scan showed traumatic subarachnoid hemorrhage and small tentorial subdural and neurosurgical evaluation was requested  Past Medical History:  Diagnosis Date  . CAD (coronary artery disease)    prior CFX/RCA PCI 2011, cath OK 3/13  . Carotid bruit 03/14/11   CAROTID DUPLEX DOPPLER-Right bulb; demonstrated a mild amount of fibrous plaque w/o evidence of a significant diameter reduction, dissection or any other vascular abnormality.;Left ICA;-proximal, mid and distal segments of the internal carotid artery normal patency.mid and distal segments demonstrating moderate tortuosity..Mildly abnormal carotid duplex scan.  . Chronic renal insufficiency, stage III (moderate) ([facility])   . DJD (degenerative joint disease)    c-spine  . Dyslipidemia   . Dysrhythmia   . GERD (gastroesophageal reflux disease)   . Hypercholesteremia   . Hypertension   . PAF (paroxysmal atrial fibrillation) [facility]) March 2013  . Sleep apnea    on C-Pap  . Type 2 diabetes mellitus with chronic kidney disease ([place])    diet controlled    Past Surgical History:  Procedure Laterality Date  . C-spine surgery  2001  . cardiac catherization  03/04/12   Dr Gwenlyn Found  . CARDIOVERSION  03/07/2012   Procedure: CARDIOVERSION;  Surgeon: Sanda Klein, MD;  Location: [facility];  Service: Cardiovascular;  Laterality: N/A;  . COLONOSCOPY WITH PROPOFOL N/A 01/05/2017   Procedure: COLONOSCOPY WITH  PROPOFOL;  Surgeon: Lollie Sails, MD;  Location: [facility] ENDOSCOPY;  Service: Endoscopy;  Laterality: N/A;  . CORONARY ANGIOPLASTY WITH STENT PLACEMENT  Dec 2011   [place] WITH CORONARY ANGIOGRAM N/A 03/04/2012   Procedure: LEFT HEART CATHETERIZATION WITH CORONARY ANGIOGRAM;  Surgeon: Lorretta Harp, MD;  Location: [facility] CATH LAB;  Service: Cardiovascular;  Laterality: N/A;  . TEE WITHOUT CARDIOVERSION  03/07/2012   Procedure: TRANSESOPHAGEAL ECHOCARDIOGRAM (TEE);  Surgeon: Sanda Klein, MD;  Location: [facility] ENDOSCOPY;  Service: Cardiovascular;  Laterality: N/A;    Allergies  Allergen Reactions  . Angiotensin Receptor Blockers Cough  . Demeclocycline Other (See Comments)    Social History   Tobacco Use  . Smoking status: Never Smoker  . Smokeless tobacco: Never Used  Substance Use Topics  . Alcohol use: No    History reviewed. No pertinent family history.   Review of Systems  Positive ROS: neg  All other systems have been reviewed and were otherwise negative with the exception of those mentioned in the HPI and as above.  Objective: Vital signs in last 24 hours: Temp:  [98.3 F (36.8 C)] 98.3 F (36.8 C) (09/12 1106) Pulse Rate:  [60-72] 67 (09/12 1430) Resp:  [11-27] 19 (09/12 1430) BP: (166-186)/(74-92) 173/86 (09/12 1430) SpO2:  [95 %-98 %] 96 % (09/12 1430) Weight:  [102.1 kg] 102.1 kg (09/12 1106)  General Appearance: Alert, cooperative, no distress, appears stated age Head: Normocephalic,  abrasion to her occipital region Eyes: PERRL, conjunctiva/corneas clear, EOM's intact Throat: benign Neck: Suppleations unlabored Heart: Regular rate and rhythmp Abdomen: Softy  Extremities: Extremities normal, atraumatic, no cyanosis or edema Pulses: 2+ and symmetric all extremities Skin: Skin color, texture, turgor normal, no rashes or lesions  NEUROLOGIC:   Mental status: A&O x4, no aphasia, good attention span, Memory and fund of  knowledge peared to be normal Motor Exam - grossly normal, normal tone and bulk Sensory Exam - grossly normal Reflexes: symmetric, no pathologic reflexes, No Hoffman's, No clonus Coordination - grossly normal Gait - not tested Balance - not tested Cranial Nerves: I: smell Not tested  II: visual acuity  OS: na    OD: na  II: visual fields Full to confrontation  II: pupils Equal, round, reactive to light  III,VII: ptosis None  III,IV,VI: extraocular muscles  Full ROM  V: mastication Normal  V: facial light touch sensation  Normal  V,VII: corneal reflex  Present  VII: facial muscle function - upper  Normal  VII: facial muscle function - lower Normal  VIII: hearing Not tested  IX: soft palate elevation  Normal  IX,X: gag reflex Present  XI: trapezius strength  5/5  XI: sternocleidomastoid strength 5/5  XI: neck flexion strength  5/5  XII: tongue strength  Normal    Data Review Lab Results  Component Value Date   WBC 7.3 08/22/2018   HGB 14.3 08/22/2018   HCT 42.0 08/22/2018   MCV 90.5 08/22/2018   PLT 229 08/22/2018   Lab Results  Component Value Date   NA 141 08/22/2018   K 3.9 08/22/2018   CL 107 08/22/2018   CO2 22 08/22/2018   BUN 24 (H) 08/22/2018   CREATININE 1.50 (H) 08/22/2018   GLUCOSE 169 (H) 08/22/2018   Lab Results  Component Value Date   INR 1.42 03/07/2012    Radiology: Ct Head Wo Contrast  Result Date: 08/22/2018 CLINICAL DATA:  Head injury after fall. Positive loss of consciousness. EXAM: CT HEAD WITHOUT CONTRAST CT CERVICAL SPINE WITHOUT CONTRAST TECHNIQUE: Multidetector CT imaging of the head and cervical spine was performed following the standard protocol without intravenous contrast. Multiplanar CT image reconstructions of the cervical spine were also generated. COMPARISON:  None. FINDINGS: CT HEAD FINDINGS Brain: Moderate size posterior scalp hematoma is noted. Small right-sided tentorial subdural hematoma is noted. No mass effect or midline  shift is noted. Ventricular size is within normal limits. Small amount of left frontal subarachnoid hemorrhage is noted as well. There is no evidence of mass lesion or acute infarction. Vascular: No hyperdense vessel or unexpected calcification. Skull: Normal. Negative for fracture or focal lesion. Sinuses/Orbits: No acute finding. Other: Fluid is noted in bilateral mastoid air cells. CT CERVICAL SPINE FINDINGS Alignment: Normal. Skull base and vertebrae: No acute fracture. No primary bone lesion or focal pathologic process. Soft tissues and spinal canal: No prevertebral fluid or swelling. No visible canal hematoma. Disc levels: Status post surgical fusion of C6-7 disc space. Mild anterior osteophyte formation is noted at C4-5 and C5-6. Upper chest: Negative. Other: Degenerative changes are seen involving posterior facet joints bilaterally. IMPRESSION: Small right-sided tentorial subdural hematoma is noted as well as small focus of left frontal subarachnoid hemorrhage. Critical Value/emergent results were called by telephone at the time of interpretation on 08/22/2018 at 1:52 pm to Dr. Wyn Quaker , who verbally acknowledged these results. Moderate size posterior scalp hematoma. Postsurgical and degenerative changes noted in the cervical spine. No fracture or spondylolisthesis is noted. Electronically Signed   By: Marijo Conception, M.D.   On: 08/22/2018 13:53   Ct Cervical Spine Wo  Contrast  Result Date: 08/22/2018 CLINICAL DATA:  Head injury after fall. Positive loss of consciousness. EXAM: CT HEAD WITHOUT CONTRAST CT CERVICAL SPINE WITHOUT CONTRAST TECHNIQUE: Multidetector CT imaging of the head and cervical spine was performed following the standard protocol without intravenous contrast. Multiplanar CT image reconstructions of the cervical spine were also generated. COMPARISON:  None. FINDINGS: CT HEAD FINDINGS Brain: Moderate size posterior scalp hematoma is noted. Small right-sided tentorial subdural  hematoma is noted. No mass effect or midline shift is noted. Ventricular size is within normal limits. Small amount of left frontal subarachnoid hemorrhage is noted as well. There is no evidence of mass lesion or acute infarction. Vascular: No hyperdense vessel or unexpected calcification. Skull: Normal. Negative for fracture or focal lesion. Sinuses/Orbits: No acute finding. Other: Fluid is noted in bilateral mastoid air cells. CT CERVICAL SPINE FINDINGS Alignment: Normal. Skull base and vertebrae: No acute fracture. No primary bone lesion or focal pathologic process. Soft tissues and spinal canal: No prevertebral fluid or swelling. No visible canal hematoma. Disc levels: Status post surgical fusion of C6-7 disc space. Mild anterior osteophyte formation is noted at C4-5 and C5-6. Upper chest: Negative. Other: Degenerative changes are seen involving posterior facet joints bilaterally. IMPRESSION: Small right-sided tentorial subdural hematoma is noted as well as small focus of left frontal subarachnoid hemorrhage. Critical Value/emergent results were called by telephone at the time of interpretation on 08/22/2018 at 1:52 pm to Dr. Wyn Quaker , who verbally acknowledged these results. Moderate size posterior scalp hematoma. Postsurgical and degenerative changes noted in the cervical spine. No fracture or spondylolisthesis is noted. Electronically Signed   By: Marijo Conception, M.D.   On: 08/22/2018 13:53     Assessment/Plan: Estimated body mass index is 30.52 kg/m as calculated from the following:   Height as of this encounter: 6' (1.829 m).   Weight as of this encounter: 71.42 kg.   67 year old gentleman with a tiny amount of left frontal traumatic subarachnoid hemorrhage and a small tentorial subdural hematoma who looks great at this point neurologically. I do think he should be admitted to a stepdown unit for observation and to make sure there is not a obvious CAUSE of his fall suggest syncopal  episode, especially given his cardiac history. Recommend repeat CT scan of the head tomorrow morning or if there is some change in neurological exam. Obviously, hold xarelto for now. Likely can restart the aspirin in 3 or 4 days.probably no need to reverse the anticoagulant at this point   Tabatha Razzano S 08/22/2018 2:57 PM

## 2018-08-22 NOTE — ED Triage Notes (Signed)
Pt arrived via [facility] from r/t fall from trailer of 48ft. Pt had witnessed LOC 3-4 minutes. HIt posterior head with bleeding, pt is on Xarelto, abrasion to left shoulder, c/o right flank pain. Neuro intact, A&OX4. Pt reports last tetanus within the past 6 months. HX of Afib, NSR for EMS. Daughter at bedside reports that pt is on antibiotic for bacterial sinusitis.

## 2018-08-22 NOTE — ED Notes (Addendum)
Provided wound care to pt's head, tolerated well and changed gown and pt's bed linen as well.

## 2018-08-22 NOTE — ED Provider Notes (Signed)
Medical screening examination/treatment/procedure(s) were conducted as a shared visit with non-physician practitioner(s) and myself.  I personally evaluated the patient during the encounter.  EKG Interpretation  Date/Time:  Thursday August 22 2018 12:30:20 EDT Ventricular Rate:  71 PR Interval:    QRS Duration: 101 QT Interval:  387 QTC Calculation: 421 R Axis:   19 Text Interpretation:  Sinus rhythm Left atrial enlargement Electrode noise No significant change since last tracing 3 hrs before Confirmed by Rolland Porter [phone]) on 08/23/2018 9:21:51 PM  Patient had a fall from tractor while working.  He does take Xarelto.  He was unresponsive for several minutes.  Patient is alert and appropriate.  Mental status clear.  No focal neurologic deficit.  No respiratory distress.  Patient has identified subarachnoid bleed as well as temporal subdural bleed.  Blood pressure control undertaken with hydralazine and Cardene drip.  Agree with plan of management.   Charlesetta Shanks, MD 08/27/18 1710

## 2018-08-22 NOTE — ED Notes (Signed)
ED Provider at bedside. 

## 2018-08-22 NOTE — Progress Notes (Signed)
Patient refused our CPAP tonight. Made him aware if he changed his mind to have RN to call RT and we will bring him one.

## 2018-08-22 NOTE — ED Notes (Signed)
Place a condom cath on pt.

## 2018-08-22 NOTE — ED Provider Notes (Signed)
[place] EMERGENCY DEPARTMENT Provider Note   CSN: [id number] Arrival date & time: 08/22/18  1058     History   Chief Complaint Chief Complaint  Patient presents with  . Fall  . Loss of Consciousness    HPI Jason Neal is a 67 y.o. male with a past medical history of CAD, who presents today for evaluation after a fall.  He was standing on tractor at work when he fell.  He does not know what caused him to fall, does not remember falling.  According to bystander who saw the event he was unconscious for 3 to 4 minutes before regaining consciousness.  He is anticoagulated with Xarelto.  He reports pain in his left shoulder, head, and neck.  He was initially bleeding from the back of his head which has stopped.  He denies any chest pain.  He has been taking antibiotics for a sinus infection.   HPI  Past Medical History:  Diagnosis Date  . CAD (coronary artery disease)    prior CFX/RCA PCI 2011, cath OK 3/13  . Carotid bruit 03/14/11   CAROTID DUPLEX DOPPLER-Right bulb; demonstrated a mild amount of fibrous plaque w/o evidence of a significant diameter reduction, dissection or any other vascular abnormality.;Left ICA;-proximal, mid and distal segments of the internal carotid artery normal patency.mid and distal segments demonstrating moderate tortuosity..Mildly abnormal carotid duplex scan.  . Chronic renal insufficiency, stage III (moderate) (HCC)   . DJD (degenerative joint disease)    c-spine  . Dyslipidemia   . Dysrhythmia   . GERD (gastroesophageal reflux disease)   . Hypercholesteremia   . Hypertension   . PAF (paroxysmal atrial fibrillation) [facility]) March 2013  . Sleep apnea    on C-Pap  . Type 2 diabetes mellitus with chronic kidney disease ([place])    diet controlled    Patient Active Problem List   Diagnosis Date Noted  . Aortic stenosis, mild 05/28/2018  . Chronic anticoagulation 11/20/2013  . Chronic renal insufficiency, stage III (moderate)- he  sees a nephrologist in [facility] 11/20/2013  . 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 PAF successful cardioversion  03/07/12 to SR/SB 03/04/2012  . CAD, prior CFX/RCA DES Dec 2011, cath 03/04/12  patent stents- stable CAD 03/04/2012  . Dyslipidemia 03/04/2012  . HTN (hypertension) 03/04/2012  . Sleep apnea, on C-Pap 03/04/2012    Past Surgical History:  Procedure Laterality Date  . C-spine surgery  2001  . cardiac catherization  03/04/12   Dr Gwenlyn Found  . CARDIOVERSION  03/07/2012   Procedure: CARDIOVERSION;  Surgeon: Sanda Klein, MD;  Location: [place];  Service: Cardiovascular;  Laterality: N/A;  . COLONOSCOPY WITH PROPOFOL N/A 01/05/2017   Procedure: COLONOSCOPY WITH PROPOFOL;  Surgeon: Lollie Sails, MD;  Location: [facility] ENDOSCOPY;  Service: Endoscopy;  Laterality: N/A;  . CORONARY ANGIOPLASTY WITH STENT PLACEMENT  Dec 2011   [place] WITH CORONARY ANGIOGRAM N/A 03/04/2012   Procedure: LEFT HEART CATHETERIZATION WITH CORONARY ANGIOGRAM;  Surgeon: Lorretta Harp, MD;  Location: [facility] CATH LAB;  Service: Cardiovascular;  Laterality: N/A;  . TEE WITHOUT CARDIOVERSION  03/07/2012   Procedure: TRANSESOPHAGEAL ECHOCARDIOGRAM (TEE);  Surgeon: Sanda Klein, MD;  Location: [facility] ENDOSCOPY;  Service: Cardiovascular;  Laterality: N/A;        Home Medications    Prior to Admission medications   Medication Sig Start Date End Date Taking? Authorizing Provider  aspirin EC 81 MG tablet Take  1 tablet (81 mg total) by mouth daily. 11/20/13  Yes Kilroy, Luke K, PA-C  atorvastatin (LIPITOR) 40 MG tablet Take 40 mg by mouth daily.   Yes [provider]  B Complex-C (B-COMPLEX WITH VITAMIN C) tablet Take 1 tablet by mouth daily.   Yes [provider]  calcium carbonate (TUMS - DOSED IN MG ELEMENTAL CALCIUM) 500 MG chewable tablet Chew 1 tablet by mouth daily as needed for indigestion or heartburn.   Yes  [provider]  carvedilol (COREG) 6.25 MG tablet Take 1 tablet by mouth 2 (two) times daily. 12/10/15  Yes [provider]  cefdinir (OMNICEF) 300 MG capsule Take 300 mg by mouth 2 (two) times daily. FOR 14 DAYS. 08/16/18  Yes [provider]  cetirizine (ZYRTEC) 10 MG tablet Take 5 mg by mouth daily as needed for allergies.    Yes [provider]  cholecalciferol (VITAMIN D) 1000 UNITS tablet Take 1,000-2,000 Units by mouth See admin instructions. 2000units in am, 1000 units in pm   Yes [provider]  diltiazem (CARDIZEM SR) 90 MG 12 hr capsule TAKE ONE CAPSULE BY MOUTH TWICE A DAY Patient taking differently: Take 90 mg by mouth 2 (two) times daily.  12/14/17  Yes Lorretta Harp, MD  fluticasone (FLONASE) 50 MCG/ACT nasal spray Place 1 spray into the nose as needed for allergies.  12/10/15  Yes [provider]  hydrALAZINE (APRESOLINE) 50 MG tablet Take 50 mg by mouth 2 (two) times daily.   Yes [provider]  losartan (COZAAR) 100 MG tablet TAKE 1 TABLET DAILY Patient taking differently: Take 100 mg by mouth daily.  09/13/15  Yes Ashok Norris, MD  nitroGLYCERIN (NITROSTAT) 0.4 MG SL tablet Place 0.4 mg under the tongue every 5 (five) minutes as needed for chest pain.   Yes [provider]  ofloxacin (FLOXIN) 0.3 % OTIC solution Place 1 drop into both ears daily. 07/15/18  Yes [provider]  omega-3 acid ethyl esters (LOVAZA) 1 G capsule Take 1 g by mouth 2 (two) times daily.   Yes [provider]  pantoprazole (PROTONIX) 40 MG tablet Take 40 mg by mouth every morning.   Yes [provider]  Polyethyl Glycol-Propyl Glycol (SYSTANE) 0.4-0.3 % SOLN Place 1 drop into both eyes as needed (dry eyes).    Yes [provider]  simethicone (GAS-X) 80 MG chewable tablet Chew 80 mg by mouth every 6 (six) hours as needed for flatulence.   Yes [provider]  XARELTO 20 MG TABS tablet  TAKE 1 TABLET DAILY Patient taking differently: Take 20 mg by mouth daily.  06/29/15  Yes Ashok Norris, MD  ACCU-CHEK AVIVA PLUS test strip  02/17/13   [provider]  Lancets (ACCU-CHEK MULTICLIX) lancets  03/18/13   [provider]    Family History History reviewed. No pertinent family history.  Social History Social History   Tobacco Use  . Smoking status: Never Smoker  . Smokeless tobacco: Never Used  Substance Use Topics  . Alcohol use: No  . Drug use: Never     Allergies   Angiotensin receptor blockers and Demeclocycline   Review of Systems Review of Systems  Constitutional: Negative for chills and fever.  HENT: Negative for congestion.   Respiratory: Negative for chest tightness and shortness of breath.   Gastrointestinal: Negative for abdominal pain, diarrhea, nausea and vomiting.  Genitourinary: Negative for dysuria.  Musculoskeletal: Positive for back pain and neck pain.  Neurological:  Positive for headaches. Negative for facial asymmetry, weakness and numbness.  All other systems reviewed and are negative.    Physical Exam Updated Vital Signs BP (!) 141/67   Pulse 69   Temp 98.3 F (36.8 C) (Oral)   Resp 19   Ht 6' (1.829 m)   Wt 102.1 kg   SpO2 94%   BMI 30.52 kg/m   Physical Exam  Constitutional: He is oriented to person, place, and time. He appears well-developed and well-nourished. No distress.  HENT:  Head: Normocephalic.  Wound on posterior scalp.    Eyes: Conjunctivae are normal. Right eye exhibits no discharge. Left eye exhibits no discharge. No scleral icterus.  Neck:  C-collar in place, not tested  Cardiovascular: Normal rate, regular rhythm, normal heart sounds and intact distal pulses.  No murmur heard. Pulmonary/Chest: Effort normal and breath sounds normal. No stridor. No respiratory distress. He exhibits no tenderness.  Abdominal: He exhibits no distension.  Musculoskeletal: He exhibits no edema or deformity.   Neurological: He is alert and oriented to person, place, and time. No sensory deficit. He exhibits normal muscle tone.  Patient is a and o x3.  Does not remember falling or immediately after.  He is able to follow multiple part commands and hold a conversation with out difficulty.   PEARLA EOM normal 5/5 strength in bilateral upper and lower extremities.   Skin: Skin is warm and dry. He is not diaphoretic.  Psychiatric: He has a normal mood and affect. His behavior is normal.  Nursing note and vitals reviewed.    ED Treatments / Results  Labs (all labs ordered are listed, but only abnormal results are displayed) Labs Reviewed  COMPREHENSIVE METABOLIC PANEL - Abnormal; Notable for the following components:      Result Value   Glucose, Bld 168 (*)    Creatinine, Ser 1.49 (*)    GFR calc non Af Amer 46 (*)    GFR calc Af Amer 54 (*)    All other components within normal limits  I-STAT CHEM 8, ED - Abnormal; Notable for the following components:   BUN 24 (*)    Creatinine, Ser 1.50 (*)    Glucose, Bld 169 (*)    All other components within normal limits  CBC WITH DIFFERENTIAL/PLATELET  URINALYSIS, ROUTINE W REFLEX MICROSCOPIC  I-STAT TROPONIN, ED    EKG None  Radiology Ct Head Wo Contrast  Result Date: 08/22/2018 CLINICAL DATA:  Head injury after fall. Positive loss of consciousness. EXAM: CT HEAD WITHOUT CONTRAST CT CERVICAL SPINE WITHOUT CONTRAST TECHNIQUE: Multidetector CT imaging of the head and cervical spine was performed following the standard protocol without intravenous contrast. Multiplanar CT image reconstructions of the cervical spine were also generated. COMPARISON:  None. FINDINGS: CT HEAD FINDINGS Brain: Moderate size posterior scalp hematoma is noted. Small right-sided tentorial subdural hematoma is noted. No mass effect or midline shift is noted. Ventricular size is within normal limits. Small amount of left frontal subarachnoid hemorrhage is noted as well. There  is no evidence of mass lesion or acute infarction. Vascular: No hyperdense vessel or unexpected calcification. Skull: Normal. Negative for fracture or focal lesion. Sinuses/Orbits: No acute finding. Other: Fluid is noted in bilateral mastoid air cells. CT CERVICAL SPINE FINDINGS Alignment: Normal. Skull base and vertebrae: No acute fracture. No primary bone lesion or focal pathologic process. Soft tissues and spinal canal: No prevertebral fluid or swelling. No visible canal hematoma. Disc levels: Status post surgical fusion of C6-7 disc space. Mild  anterior osteophyte formation is noted at C4-5 and C5-6. Upper chest: Negative. Other: Degenerative changes are seen involving posterior facet joints bilaterally. IMPRESSION: Small right-sided tentorial subdural hematoma is noted as well as small focus of left frontal subarachnoid hemorrhage. Critical Value/emergent results were called by telephone at the time of interpretation on 08/22/2018 at 1:52 pm to Dr. Wyn Quaker , who verbally acknowledged these results. Moderate size posterior scalp hematoma. Postsurgical and degenerative changes noted in the cervical spine. No fracture or spondylolisthesis is noted. Electronically Signed   By: Marijo Conception, M.D.   On: 08/22/2018 13:53   Ct Cervical Spine Wo Contrast  Result Date: 08/22/2018 CLINICAL DATA:  Head injury after fall. Positive loss of consciousness. EXAM: CT HEAD WITHOUT CONTRAST CT CERVICAL SPINE WITHOUT CONTRAST TECHNIQUE: Multidetector CT imaging of the head and cervical spine was performed following the standard protocol without intravenous contrast. Multiplanar CT image reconstructions of the cervical spine were also generated. COMPARISON:  None. FINDINGS: CT HEAD FINDINGS Brain: Moderate size posterior scalp hematoma is noted. Small right-sided tentorial subdural hematoma is noted. No mass effect or midline shift is noted. Ventricular size is within normal limits. Small amount of left frontal  subarachnoid hemorrhage is noted as well. There is no evidence of mass lesion or acute infarction. Vascular: No hyperdense vessel or unexpected calcification. Skull: Normal. Negative for fracture or focal lesion. Sinuses/Orbits: No acute finding. Other: Fluid is noted in bilateral mastoid air cells. CT CERVICAL SPINE FINDINGS Alignment: Normal. Skull base and vertebrae: No acute fracture. No primary bone lesion or focal pathologic process. Soft tissues and spinal canal: No prevertebral fluid or swelling. No visible canal hematoma. Disc levels: Status post surgical fusion of C6-7 disc space. Mild anterior osteophyte formation is noted at C4-5 and C5-6. Upper chest: Negative. Other: Degenerative changes are seen involving posterior facet joints bilaterally. IMPRESSION: Small right-sided tentorial subdural hematoma is noted as well as small focus of left frontal subarachnoid hemorrhage. Critical Value/emergent results were called by telephone at the time of interpretation on 08/22/2018 at 1:52 pm to Dr. Wyn Quaker , who verbally acknowledged these results. Moderate size posterior scalp hematoma. Postsurgical and degenerative changes noted in the cervical spine. No fracture or spondylolisthesis is noted. Electronically Signed   By: Marijo Conception, M.D.   On: 08/22/2018 13:53    Procedures Procedures  CRITICAL CARE Performed by: Wyn Quaker Total critical care time: 50 minutes Critical care time was exclusive of separately billable procedures and treating other patients. Critical care was necessary to treat or prevent imminent or life-threatening deterioration. Critical care was time spent personally by me on the following activities: development of treatment plan with patient and/or surrogate as well as nursing, discussions with consultants, evaluation of patient's response to treatment, examination of patient, obtaining history from patient or surrogate, ordering and performing treatments and  interventions, ordering and review of laboratory studies, ordering and review of radiographic studies, pulse oximetry and re-evaluation of patient's condition.   Medications Ordered in ED Medications  nicardipine (CARDENE) 20mg  in 0.86% saline 246ml IV infusion (0.1 mg/ml) (5 mg/hr Intravenous New Bag/Given 08/22/18 1632)  sodium chloride 0.9 % bolus 500 mL (0 mLs Intravenous Stopped 08/22/18 1329)  fentaNYL (SUBLIMAZE) injection 50 mcg (50 mcg Intravenous Given 08/22/18 1242)  ondansetron (ZOFRAN) injection 4 mg (4 mg Intravenous Given 08/22/18 1242)  iopamidol (ISOVUE-300) 61 % injection 100 mL (100 mLs Intravenous Contrast Given 08/22/18 1316)  hydrALAZINE (APRESOLINE) injection 20 mg (20 mg Intravenous Given 08/22/18 1445)  acetaminophen (TYLENOL) tablet 1,000 mg (1,000 mg Oral Given 08/22/18 1628)     Initial Impression / Assessment and Plan / ED Course  I have reviewed the triage vital signs and the nursing notes.  Pertinent labs & imaging results that were available during my care of the patient were reviewed by me and considered in my medical decision making (see chart for details).  Clinical Course as of Aug 22 1654  Thu Aug 22, 2018  1351 Left frontal subarachnoid and right temporal subdural tentoral     [EH]  76 Spoke with neurosurgery    [EH]  1653 Spoke with critical care who will come see patient for admission as he is on cardene drip.    [EH]    Clinical Course User Index [EH] Lorin Glass, PA-C   Patient presents today for evaluation after a fall.  Unsure if this was a mechanical or medically mediated fall.  Patient was unconscious for 3 to 4 minutes.  CT head showed a left frontal subarachnoid bleed and right temporal subdural bleed.  Patient is anticoagulated with Xarelto, discussed this patient with neurosurgery, his last dose was 5 PM yesterday, therefore as it is been approximately 21 hours he recommended holding Xarelto rather than reversing.  CT neck did  not show any acute abnormalities.  CT chest abdomen and pelvis were ordered did not show any acute abnormalities.  EKG with out abnormalities.  His Creatinine is mildly elevated at 1.49.    Neurosurgery was consulted who recommended blood pressure, control, observation with repeat CT scan in the morning.    Patient was given hydralazine, which did not adequately control his BP so he was started on a Cardene drip.    As patient is on a Cardene drip I spoke with critical care who will come see patient.    Laceration repair performed by Rodell Perna, PA-C, please see her note for full details.    Final Clinical Impressions(s) / ED Diagnoses   Final diagnoses:  Subarachnoid bleed ([place])  Subdural hematoma (HCC)  Acute head injury with loss of consciousness of 30 minutes or less, initial encounter [facility])  Fall, initial encounter  Hypertension, unspecified type    ED Discharge Orders    None       Ollen Gross 08/22/18 1705    Charlesetta Shanks, MD 08/27/18 1711

## 2018-08-23 ENCOUNTER — Inpatient Hospital Stay (HOSPITAL_COMMUNITY): Payer: Medicare Other

## 2018-08-23 DIAGNOSIS — I609 Nontraumatic subarachnoid hemorrhage, unspecified: Secondary | ICD-10-CM

## 2018-08-23 LAB — CBC
HEMATOCRIT: 40.1 % (ref 39.0–52.0)
Hemoglobin: 13.1 g/dL (ref 13.0–17.0)
MCH: 29.3 pg (ref 26.0–34.0)
MCHC: 32.7 g/dL (ref 30.0–36.0)
MCV: 89.7 fL (ref 78.0–100.0)
PLATELETS: 237 10*3/uL (ref 150–400)
RBC: 4.47 MIL/uL (ref 4.22–5.81)
RDW: 13.4 % (ref 11.5–15.5)
WBC: 7.7 10*3/uL (ref 4.0–10.5)

## 2018-08-23 LAB — BASIC METABOLIC PANEL
Anion gap: 7 (ref 5–15)
BUN: 21 mg/dL (ref 8–23)
CHLORIDE: 107 mmol/L (ref 98–111)
CO2: 24 mmol/L (ref 22–32)
Calcium: 9.1 mg/dL (ref 8.9–10.3)
Creatinine, Ser: 1.58 mg/dL — ABNORMAL HIGH (ref 0.61–1.24)
GFR calc non Af Amer: 43 mL/min — ABNORMAL LOW (ref >60)
GFR, EST AFRICAN AMERICAN: 50 mL/min — AB (ref >60)
Glucose, Bld: 152 mg/dL — ABNORMAL HIGH (ref 70–99)
Potassium: 3.7 mmol/L (ref 3.5–5.1)
SODIUM: 138 mmol/L (ref 135–145)

## 2018-08-23 LAB — GLUCOSE, CAPILLARY
GLUCOSE-CAPILLARY: 212 mg/dL — AB (ref 70–99)
Glucose-Capillary: 152 mg/dL — ABNORMAL HIGH (ref 70–99)

## 2018-08-23 IMAGING — CT CT HEAD W/O CM
4 series · 16 of 47 positions shown, 18 images · non-contrast
Comparison: [DATE] CT head.

CLINICAL DATA: 66 y/o  M; head trauma for follow-up.

EXAM:
CT HEAD WITHOUT CONTRAST
TECHNIQUE: Contiguous axial images were obtained from the base of the skull
through the vertex without intravenous contrast.

[Series 3: head without · axial · non-contrast · 0.43mm/px · z∈[+1299,+1429]mm · 7 of 36 slices shown, 9 images]
[im 5/36  brain]
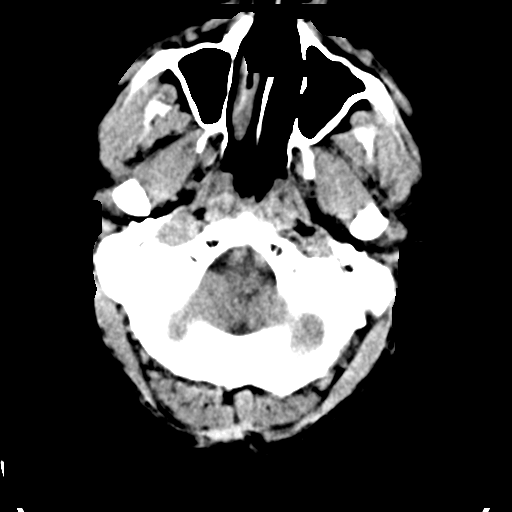
[im 5/36  bone]
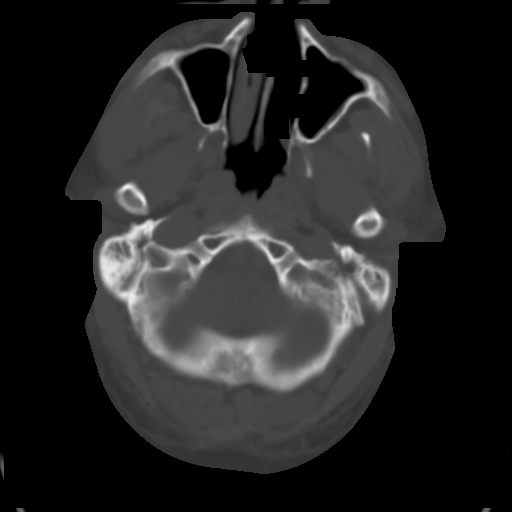
[im 9/36  brain]
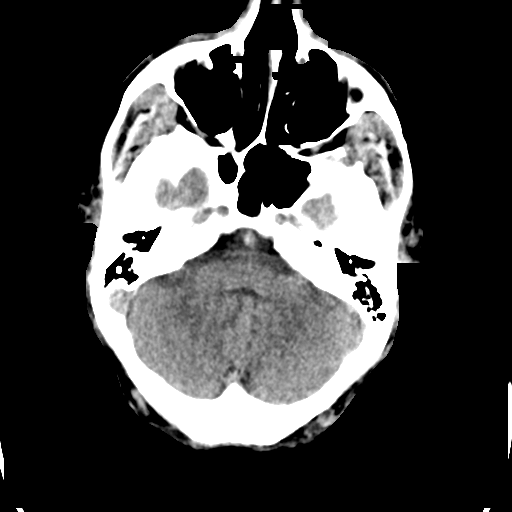
[im 14/36  brain]
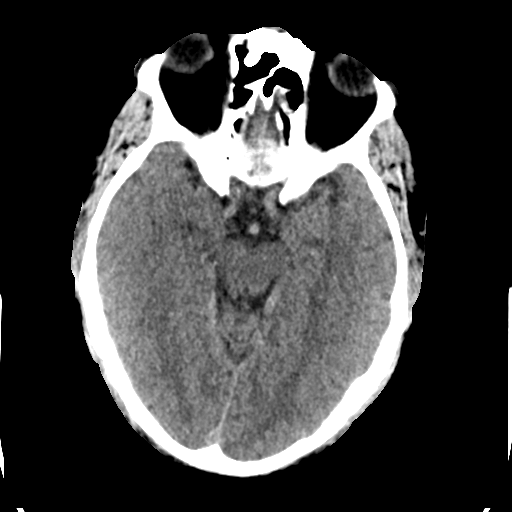
[im 18/36  brain]
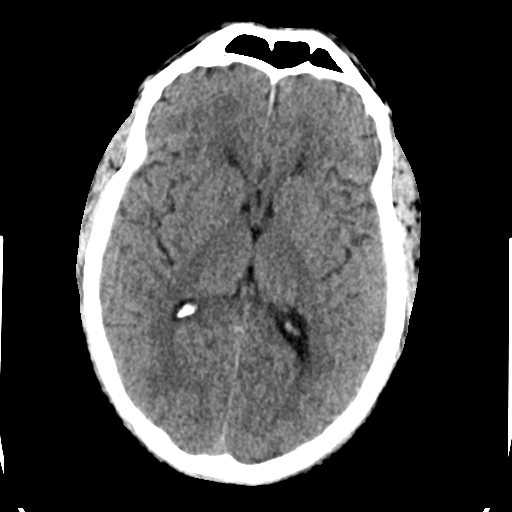
[im 22/36  brain]
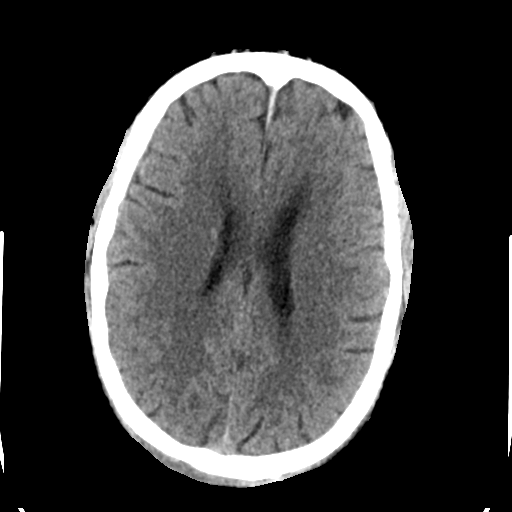
[im 22/36  bone]
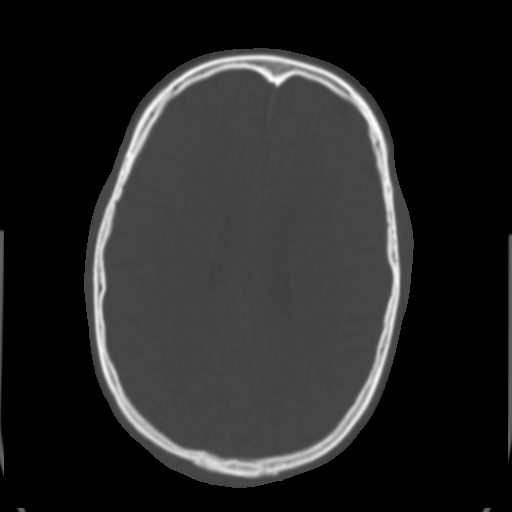
[im 27/36  brain]
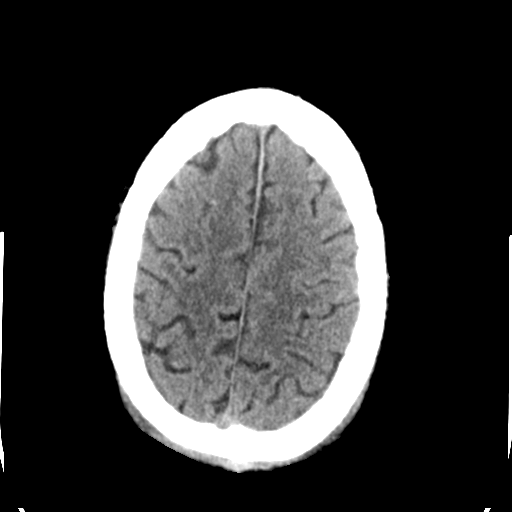
[im 31/36  brain]
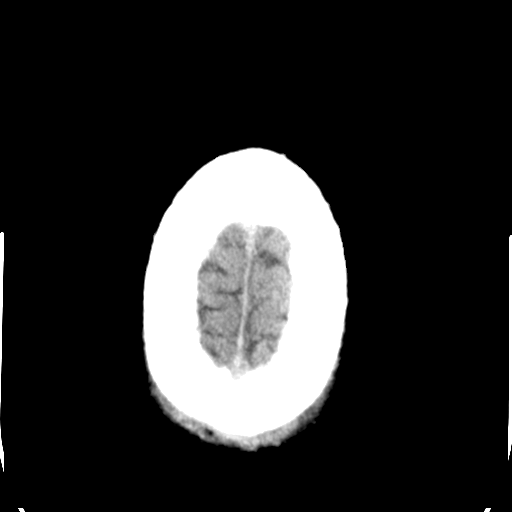

[Series 4: head bone · axial · 0.43mm/px · z∈[+1295,+1331]mm · 3 of 89 slices shown]
[im 9/89  bone]
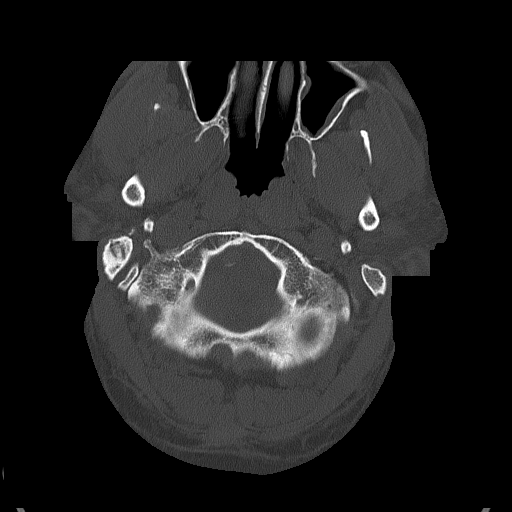
[im 18/89  bone]
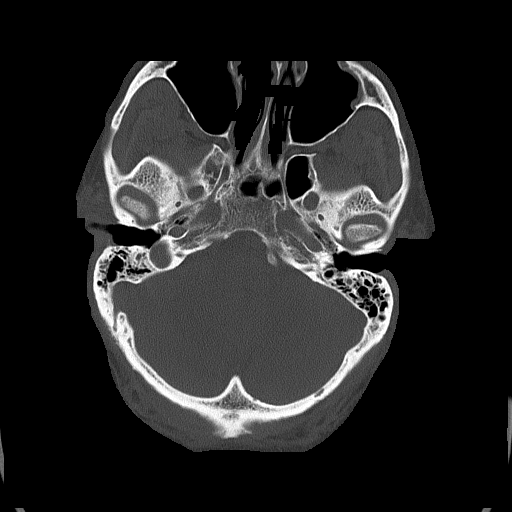
[im 27/89  bone]
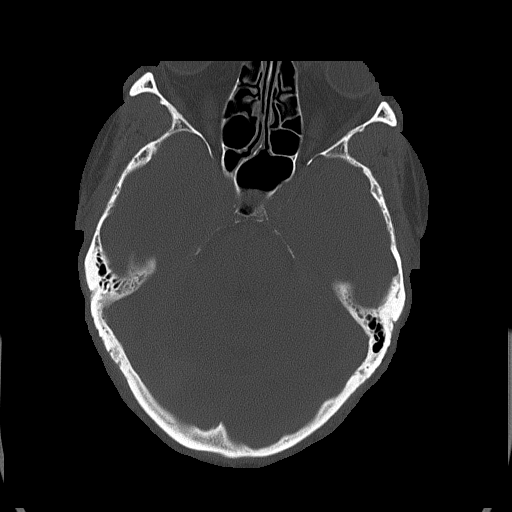

[Series 5: head without cor · coronal · non-contrast · 0.33mm/px · 3 of 71 slices shown]
[im 24/71  brain]
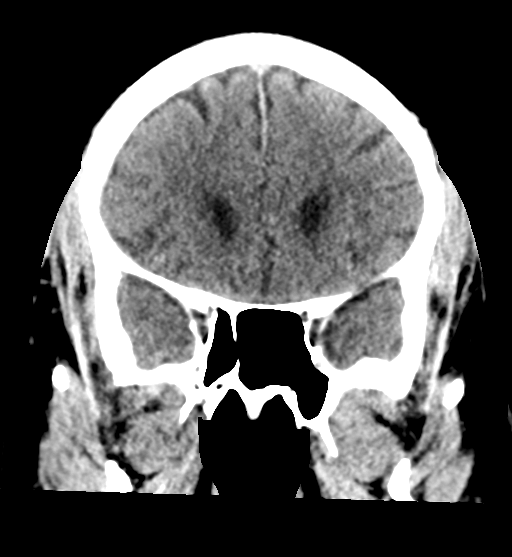
[im 32/71  brain]
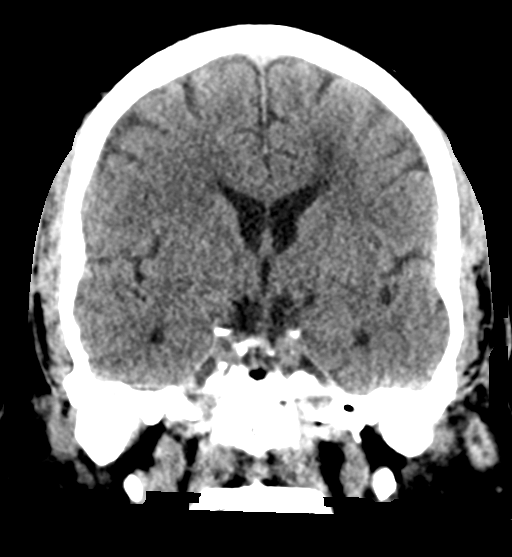
[im 39/71  brain]
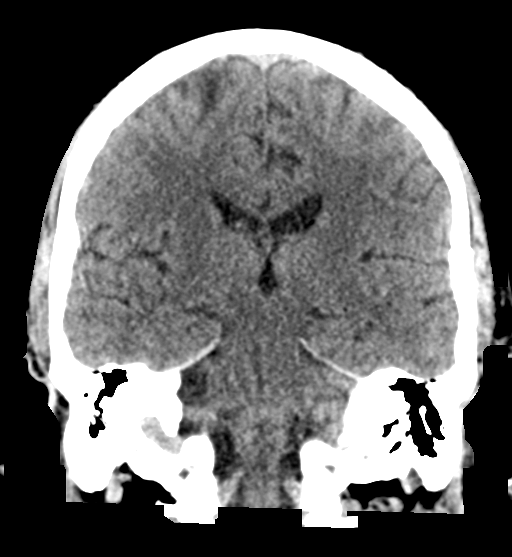

[Series 6: head without sag · sagittal · non-contrast · 0.34mm/px · 3 of 56 slices shown]
[im 19/56  brain]
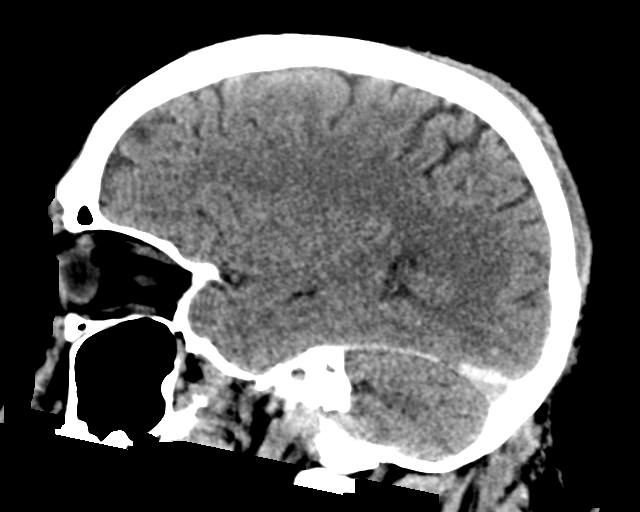
[im 28/56  brain]
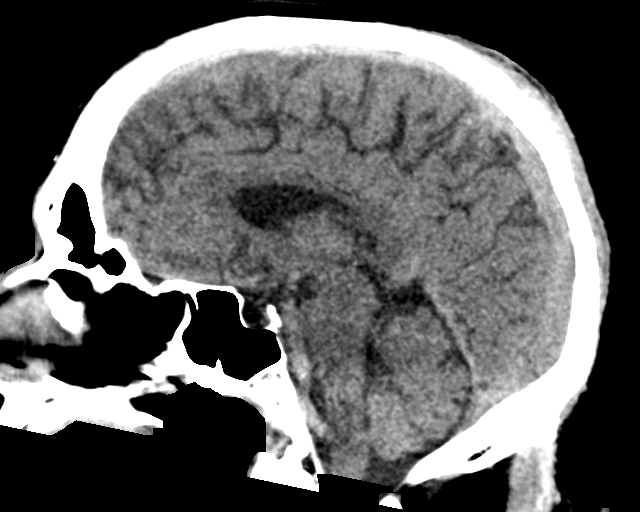
[im 37/56  brain]
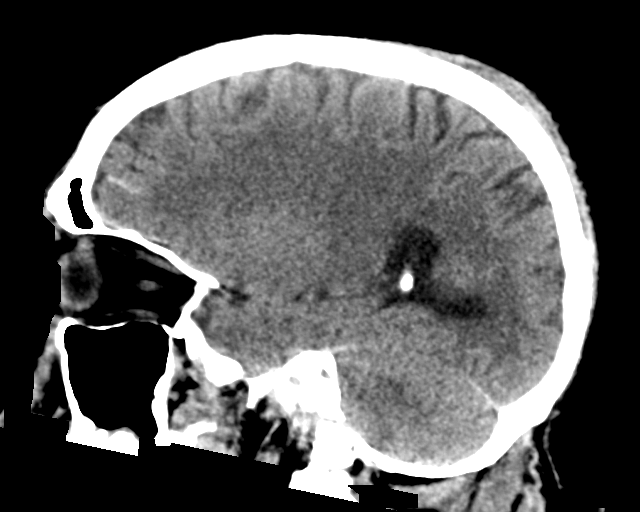

[16 of 47 positions shown; findings below may reference images not displayed]

FINDINGS: Brain: Stable left frontal small volume subarachnoid hemorrhage.
Stable thin subdural hematoma along the right tentorium cerebelli
and falx. No new acute intracranial hemorrhage, mass effect, stroke,
extra-axial collection, hydrocephalus, or herniation.

Vascular: Calcific atherosclerosis of carotid siphons. No hyperdense
vessel.

Skull: Normal. Negative for fracture or focal lesion. Right parietal
scalp contusion.

Sinuses/Orbits: Mild maxillary and ethmoid sinus mucosal thickening.
Trace opacification of mastoid air cells. Orbits are unremarkable.

Other: None.
IMPRESSION: Stable small left frontal subarachnoid hemorrhage. Stable right
tentorium and falx thin subdural hematoma. No mass effect or
herniation. No new acute intracranial abnormality. Stable right
parietal scalp contusion.

By: MARONKO M.D.

## 2018-08-23 MED ORDER — CARVEDILOL 3.125 MG PO TABS
6.2500 mg | ORAL_TABLET | Freq: Two times a day (BID) | ORAL | Status: DC
  Administered 2018-08-23: 6.25 mg via ORAL
  Filled 2018-08-23: qty 2

## 2018-08-23 MED ORDER — HYDRALAZINE HCL 50 MG PO TABS
50.0000 mg | ORAL_TABLET | Freq: Two times a day (BID) | ORAL | Status: DC
  Administered 2018-08-23: 50 mg via ORAL
  Filled 2018-08-23: qty 1

## 2018-08-23 MED ORDER — LOSARTAN POTASSIUM 50 MG PO TABS
100.0000 mg | ORAL_TABLET | Freq: Every day | ORAL | Status: DC
  Administered 2018-08-23: 100 mg via ORAL
  Filled 2018-08-23: qty 2

## 2018-08-23 MED ORDER — AMOXICILLIN-POT CLAVULANATE 875-125 MG PO TABS: 1.0000 | ORAL_TABLET | Freq: Two times a day (BID) | ORAL | 0 refills | Status: AC

## 2018-08-23 MED ORDER — ASPIRIN EC 81 MG PO TBEC
81.0000 mg | DELAYED_RELEASE_TABLET | Freq: Every day | ORAL | Status: DC
  Administered 2018-08-23: 81 mg via ORAL
  Filled 2018-08-23: qty 1

## 2018-08-23 MED ORDER — RIVAROXABAN 20 MG PO TABS: 20.0000 mg | ORAL_TABLET | Freq: Every day | ORAL | 0 refills | Status: DC

## 2018-08-23 MED ORDER — DILTIAZEM HCL ER 90 MG PO CP12
90.0000 mg | ORAL_CAPSULE | Freq: Two times a day (BID) | ORAL | Status: DC
  Administered 2018-08-23: 90 mg via ORAL
  Filled 2018-08-23 (×2): qty 1

## 2018-08-23 MED ORDER — SALINE SPRAY 0.65 % NA SOLN: 2.0000 | Freq: Two times a day (BID) | NASAL | 0 refills | Status: DC

## 2018-08-23 NOTE — Progress Notes (Signed)
Transcranial Doppler  Date POD PCO2 HCT BP  MCA ACA PCA OPHT SIPH VERT Basilar  08/23/18 MS     Right  Left   42  43   -18  -24   14  -28   27  16    46  -17   *  *   *           Right  Left                                            Right  Left                                             Right  Left                                             Right  Left                                            Right  Left                                            Right  Left                                        MCA = Middle Cerebral Artery      OPHT = Opthalmic Artery     BASILAR = Basilar Artery   ACA = Anterior Cerebral Artery     SIPH = Carotid Siphon PCA = Posterior Cerebral Artery   VERT = Verterbral Artery                   Normal MCA = 62+\-12 ACA = 50+\-12 PCA = 42+\-23   08/23/18- *Unable to insonate.  08/23/2018 1:14 PM Maudry Mayhew, MHA, RVT, RDCS, RDMS

## 2018-08-23 NOTE — Evaluation (Signed)
Physical Therapy Evaluation Patient Details Name: Jason Neal MRN: [id number] DOB: 1951/09/19 Today's Date: 08/23/2018   History of Present Illness  Pt is a 67 y/o male admitted after fall from trailer. CT and follow up CT revealed stable small L frontal subarachnoid hemhorrhage and R tentorium and falx thin subdural hematoma. PMH includes DM, a fib, CAD, and HTN.   Clinical Impression  Pt admitted secondary to problem above with deficits below. Pt presenting with unsteadiness and required min guard for mobility tasks. Educated about use of walking poles for outdoor surfaces to increase stability. Feel pt would benefit from outpatient PT follow up to address balance deficits, however, when discussed during session, pt and family politely declining. Will continue to follow acutely to maximize functional mobility independence and safety.     Follow Up Recommendations Outpatient PT;Supervision for mobility/OOB(will likely refuse )    Equipment Recommendations  None recommended by PT    Recommendations for Other Services       Precautions / Restrictions Precautions Precautions: Fall Precaution Comments: Reports [place] uneven [place].  Restrictions Weight Bearing Restrictions: No      Mobility  Bed Mobility               General bed mobility comments: In chair upon entry   Transfers Overall transfer level: Needs assistance Equipment used: None Transfers: Sit to/from Stand Sit to Stand: Min guard         General transfer comment: Min guard for safety.   Ambulation/Gait Ambulation/Gait assistance: Min guard Gait Distance (Feet): 300 Feet Assistive device: None Gait Pattern/deviations: Step-through pattern;Decreased stride length;Wide base of support Gait velocity: Decreased    General Gait Details: Slow, guarded gait. Mild unsteadiness noted and had waddle type gait. Pt reports he normally has wide BOS to help with steadying during  ambulation. Educated about use of hiking pole or walking stick when on outdoor surfaces to increase stability.   Stairs Stairs: Yes       General stair comments: Verbally reviewed need for assist with stair navigation.   Wheelchair Mobility    Modified Rankin (Stroke Patients Only)       Balance Overall balance assessment: Needs assistance Sitting-balance support: No upper extremity supported;Feet supported Sitting balance-Leahy Scale: Good     Standing balance support: No upper extremity supported;During functional activity Standing balance-Leahy Scale: Fair                               Pertinent Vitals/Pain Pain Assessment: Faces Faces Pain Scale: Hurts little more Pain Location: headache  Pain Descriptors / Indicators: Headache Pain Intervention(s): Limited activity within patient's tolerance;Monitored during session;Repositioned    Home Living Family/patient expects to be discharged to:: Private residence Living Arrangements: Spouse/significant other Available Help at Discharge: Family Type of Home: House Home Access: Stairs to enter Entrance Stairs-Rails: None Technical brewer of Steps: 2 Home Layout: One level Home Equipment: None      Prior Function Level of Independence: Independent         Comments: Was working on farm, however, 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 Also reports he runs into door jams on the R.      Hand Dominance        Extremity/Trunk Assessment   Upper Extremity Assessment Upper Extremity Assessment: Defer to OT evaluation;LUE deficits/detail LUE Deficits / Details: Reports soreness in shoulder, however,  able to raise overhead.     Lower Extremity Assessment Lower Extremity Assessment: Overall WFL for tasks assessed    Cervical / Trunk Assessment Cervical / Trunk Assessment: Normal  Communication   Communication: No difficulties  Cognition Arousal/Alertness:  Awake/alert Behavior During Therapy: WFL for tasks assessed/performed Overall Cognitive Status: Within Functional Limits for tasks assessed                                        General Comments General comments (skin integrity, edema, etc.): Performed visual tracking and did not note any abnormalities. Educated about outpatient PT, however, pt and wife politely declining at this time.     Exercises     Assessment/Plan    PT Assessment Patient needs continued PT services  PT Problem List Decreased balance;Decreased mobility;Decreased knowledge of precautions;Decreased knowledge of use of DME       PT Treatment Interventions DME instruction;Gait training;Stair training;Functional mobility training;Therapeutic activities;Therapeutic exercise;Balance training;Patient/family education    PT Goals (Current goals can be found in the Care Plan section)  Acute Rehab PT Goals Patient Stated Goal: to go home today  PT Goal Formulation: With patient Time For Goal Achievement: 09/06/18 Potential to Achieve Goals: Good    Frequency Min 4X/week   Barriers to discharge        Co-evaluation               AM-PAC PT "6 Clicks" Daily Activity  Outcome Measure Difficulty turning over in bed (including adjusting bedclothes, sheets and blankets)?: A Little Difficulty moving from lying on back to sitting on the side of the bed? : A Little Difficulty sitting down on and standing up from a chair with arms (e.g., wheelchair, bedside commode, etc,.)?: Unable Help needed moving to and from a bed to chair (including a wheelchair)?: A Little Help needed walking in hospital room?: A Little Help needed climbing 3-5 steps with a railing? : A Lot 6 Click Score: 15    End of Session Equipment Utilized During Treatment: Gait belt Activity Tolerance: Patient tolerated treatment well Patient left: in chair;with call bell/phone within reach;with family/visitor present;with  nursing/sitter in room Nurse Communication: Mobility status PT Visit Diagnosis: Unsteadiness on feet (R26.81);Other abnormalities of gait and mobility (R26.89);Pain Pain - part of body: (headache )    Time: 8850-2774 PT Time Calculation (min) (ACUTE ONLY): 20 min   Charges:   PT Evaluation $PT Eval Low Complexity: Tensas, PT, DPT  Acute Rehabilitation Services  Pager: [phone] Office: [phone]   Rudean Hitt 08/23/2018, 11:51 AM

## 2018-08-23 NOTE — Progress Notes (Signed)
Patient ID: Jason Neal, male   DOB: 1950-12-21, 67 y.o.   MRN: [id number] Subjective: Patient reports h/a with cough, otherwise doing real well  Objective: Vital signs in last 24 hours: Temp:  [98 F (36.7 C)-98.3 F (36.8 C)] 98.2 F (36.8 C) (09/13 0400) Pulse Rate:  [56-74] 58 (09/13 0700) Resp:  [6-27] 21 (09/13 0700) BP: (104-186)/(47-92) 116/56 (09/13 0700) SpO2:  [87 %-98 %] 93 % (09/13 0700) Weight:  [102.1 kg] 102.1 kg (09/12 1106)  Intake/Output from previous day: 09/12 0701 - 09/13 0700 In: 1371.1 [I.V.:871.1; IV Piggyback:500] Out: 1500 [Urine:1500] Intake/Output this shift: No intake/output data recorded.  Neurologic: Grossly normal  Lab Results: Lab Results  Component Value Date   WBC 7.7 08/23/2018   HGB 13.1 08/23/2018   HCT 40.1 08/23/2018   MCV 89.7 08/23/2018   PLT 237 08/23/2018   Lab Results  Component Value Date   INR 1.15 08/22/2018   BMET Lab Results  Component Value Date   NA 138 08/23/2018   K 3.7 08/23/2018   CL 107 08/23/2018   CO2 24 08/23/2018   GLUCOSE 152 (H) 08/23/2018   BUN 21 08/23/2018   CREATININE 1.58 (H) 08/23/2018   CALCIUM 9.1 08/23/2018    Studies/Results: Ct Head Wo Contrast  Result Date: 08/23/2018 CLINICAL DATA:  67 y/o  M; head trauma for follow-up. EXAM: CT HEAD WITHOUT CONTRAST TECHNIQUE: Contiguous axial images were obtained from the base of the skull through the vertex without intravenous contrast. COMPARISON:  08/22/2018 CT head. FINDINGS: Brain: Stable left frontal small volume subarachnoid hemorrhage. Stable thin subdural hematoma along the right tentorium cerebelli and falx. No new acute intracranial hemorrhage, mass effect, stroke, extra-axial collection, hydrocephalus, or herniation. Vascular: Calcific atherosclerosis of carotid siphons. No hyperdense vessel. Skull: Normal. Negative for fracture or focal lesion. Right parietal scalp contusion. Sinuses/Orbits: Mild maxillary and ethmoid sinus mucosal  thickening. Trace opacification of mastoid air cells. Orbits are unremarkable. Other: None. IMPRESSION: Stable small left frontal subarachnoid hemorrhage. Stable right tentorium and falx thin subdural hematoma. No mass effect or herniation. No new acute intracranial abnormality. Stable right parietal scalp contusion. Electronically Signed   By: Kristine Garbe M.D.   On: 08/23/2018 06:47   Ct Head Wo Contrast  Result Date: 08/22/2018 CLINICAL DATA:  Head injury after fall. Positive loss of consciousness. EXAM: CT HEAD WITHOUT CONTRAST CT CERVICAL SPINE WITHOUT CONTRAST TECHNIQUE: Multidetector CT imaging of the head and cervical spine was performed following the standard protocol without intravenous contrast. Multiplanar CT image reconstructions of the cervical spine were also generated. COMPARISON:  None. FINDINGS: CT HEAD FINDINGS Brain: Moderate size posterior scalp hematoma is noted. Small right-sided tentorial subdural hematoma is noted. No mass effect or midline shift is noted. Ventricular size is within normal limits. Small amount of left frontal subarachnoid hemorrhage is noted as well. There is no evidence of mass lesion or acute infarction. Vascular: No hyperdense vessel or unexpected calcification. Skull: Normal. Negative for fracture or focal lesion. Sinuses/Orbits: No acute finding. Other: Fluid is noted in bilateral mastoid air cells. CT CERVICAL SPINE FINDINGS Alignment: Normal. Skull base and vertebrae: No acute fracture. No primary bone lesion or focal pathologic process. Soft tissues and spinal canal: No prevertebral fluid or swelling. No visible canal hematoma. Disc levels: Status post surgical fusion of C6-7 disc space. Mild anterior osteophyte formation is noted at C4-5 and C5-6. Upper chest: Negative. Other: Degenerative changes are seen involving posterior facet joints bilaterally. IMPRESSION: Small right-sided tentorial subdural hematoma  is noted as well as small focus of left  frontal subarachnoid hemorrhage. Critical Value/emergent results were called by telephone at the time of interpretation on 08/22/2018 at 1:52 pm to Dr. Wyn Quaker , who verbally acknowledged these results. Moderate size posterior scalp hematoma. Postsurgical and degenerative changes noted in the cervical spine. No fracture or spondylolisthesis is noted. Electronically Signed   By: Marijo Conception, M.D.   On: 08/22/2018 13:53   Ct Chest W Contrast  Result Date: 08/22/2018 CLINICAL DATA:  Initial evaluation for acute trauma, fall. Right flank pain. EXAM: CT CHEST, ABDOMEN, AND PELVIS WITH CONTRAST TECHNIQUE: Multidetector CT imaging of the chest, abdomen and pelvis was performed following the standard protocol during bolus administration of intravenous contrast. CONTRAST:  164mL ISOVUE-300 IOPAMIDOL (ISOVUE-300) INJECTION 61% COMPARISON:  None. FINDINGS: CT CHEST FINDINGS Cardiovascular: Intrathoracic aorta of normal caliber without aneurysm or other acute abnormality. Mild atheromatous plaque within the descending intrathoracic aorta. Visualized great vessels intact and normal. Heart size within normal limits. Diffuse 3 vessel coronary artery calcifications with aortic valvular calcifications. No pericardial effusion. Limited evaluation of the pulmonary arterial tree unremarkable. Mediastinum/Nodes: Thyroid normal. No enlarged mediastinal, hilar, or axillary lymph nodes. Esophagus within normal limits. Lungs/Pleura: Tracheobronchial tree intact and patent. Lungs well inflated bilaterally. Mild subsegmental atelectatic changes noted dependently within the lower lobes bilaterally. No focal infiltrates or evidence for contusion. No pulmonary edema or pleural effusion. No pneumothorax. No worrisome pulmonary nodule or mass. Musculoskeletal: External soft tissues demonstrate no acute finding. No acute osseous abnormality. No discrete lytic or blastic osseous lesions. Cervical fusion partially visualized. CT  ABDOMEN PELVIS FINDINGS Hepatobiliary: Liver demonstrates a normal contrast enhanced appearance. Gallbladder within normal limits. No biliary dilatation. Pancreas: Pancreas within normal limits. Spleen: Spleen intact and normal. Adrenals/Urinary Tract: Adrenal glands within normal limits. Kidneys equal size with symmetric enhancement. 5 cm cyst noted at the lower pole of the left kidney. Dish ule 1 cm cyst present at the left upper pole. No nephrolithiasis or hydronephrosis. No focal enhancing renal mass. No hydroureter. Partially distended bladder within normal limits. Stomach/Bowel: Stomach within normal limits. No evidence for bowel obstruction. No acute bowel injury. Mild colonic diverticulosis without evidence for acute diverticulitis. Normal appendix. No acute inflammatory changes about the bowels. Vascular/Lymphatic: Normal intravascular enhancement seen throughout the intra-abdominal aorta. Moderate aortic atherosclerosis without aneurysm. Mesenteric vessels patent proximally. No adenopathy. Reproductive: Prostate normal. Other: Moderate-sized fat containing left inguinal hernia. Sequelae of prior right inguinal hernia repair with small residual and/or recurrent fat containing hernia. No free air or fluid. No mesenteric or retroperitoneal hematoma. Musculoskeletal: External soft tissues within normal limits. No acute fracture or other osseous abnormality. IMPRESSION: 1. No CT evidence for acute traumatic injury within the chest, abdomen, and pelvis. 2. No other acute abnormality identified. 3. Mild colonic diverticulosis without evidence for acute diverticulitis. Electronically Signed   By: Jeannine Boga M.D.   On: 08/22/2018 14:17   Ct Cervical Spine Wo Contrast  Result Date: 08/22/2018 CLINICAL DATA:  Head injury after fall. Positive loss of consciousness. EXAM: CT HEAD WITHOUT CONTRAST CT CERVICAL SPINE WITHOUT CONTRAST TECHNIQUE: Multidetector CT imaging of the head and cervical spine was  performed following the standard protocol without intravenous contrast. Multiplanar CT image reconstructions of the cervical spine were also generated. COMPARISON:  None. FINDINGS: CT HEAD FINDINGS Brain: Moderate size posterior scalp hematoma is noted. Small right-sided tentorial subdural hematoma is noted. No mass effect or midline shift is noted. Ventricular size is within normal limits.  Small amount of left frontal subarachnoid hemorrhage is noted as well. There is no evidence of mass lesion or acute infarction. Vascular: No hyperdense vessel or unexpected calcification. Skull: Normal. Negative for fracture or focal lesion. Sinuses/Orbits: No acute finding. Other: Fluid is noted in bilateral mastoid air cells. CT CERVICAL SPINE FINDINGS Alignment: Normal. Skull base and vertebrae: No acute fracture. No primary bone lesion or focal pathologic process. Soft tissues and spinal canal: No prevertebral fluid or swelling. No visible canal hematoma. Disc levels: Status post surgical fusion of C6-7 disc space. Mild anterior osteophyte formation is noted at C4-5 and C5-6. Upper chest: Negative. Other: Degenerative changes are seen involving posterior facet joints bilaterally. IMPRESSION: Small right-sided tentorial subdural hematoma is noted as well as small focus of left frontal subarachnoid hemorrhage. Critical Value/emergent results were called by telephone at the time of interpretation on 08/22/2018 at 1:52 pm to Dr. Wyn Quaker , who verbally acknowledged these results. Moderate size posterior scalp hematoma. Postsurgical and degenerative changes noted in the cervical spine. No fracture or spondylolisthesis is noted. Electronically Signed   By: Marijo Conception, M.D.   On: 08/22/2018 13:53   Ct Abdomen Pelvis W Contrast  Result Date: 08/22/2018 CLINICAL DATA:  Initial evaluation for acute trauma, fall. Right flank pain. EXAM: CT CHEST, ABDOMEN, AND PELVIS WITH CONTRAST TECHNIQUE: Multidetector CT imaging of  the chest, abdomen and pelvis was performed following the standard protocol during bolus administration of intravenous contrast. CONTRAST:  176mL ISOVUE-300 IOPAMIDOL (ISOVUE-300) INJECTION 61% COMPARISON:  None. FINDINGS: CT CHEST FINDINGS Cardiovascular: Intrathoracic aorta of normal caliber without aneurysm or other acute abnormality. Mild atheromatous plaque within the descending intrathoracic aorta. Visualized great vessels intact and normal. Heart size within normal limits. Diffuse 3 vessel coronary artery calcifications with aortic valvular calcifications. No pericardial effusion. Limited evaluation of the pulmonary arterial tree unremarkable. Mediastinum/Nodes: Thyroid normal. No enlarged mediastinal, hilar, or axillary lymph nodes. Esophagus within normal limits. Lungs/Pleura: Tracheobronchial tree intact and patent. Lungs well inflated bilaterally. Mild subsegmental atelectatic changes noted dependently within the lower lobes bilaterally. No focal infiltrates or evidence for contusion. No pulmonary edema or pleural effusion. No pneumothorax. No worrisome pulmonary nodule or mass. Musculoskeletal: External soft tissues demonstrate no acute finding. No acute osseous abnormality. No discrete lytic or blastic osseous lesions. Cervical fusion partially visualized. CT ABDOMEN PELVIS FINDINGS Hepatobiliary: Liver demonstrates a normal contrast enhanced appearance. Gallbladder within normal limits. No biliary dilatation. Pancreas: Pancreas within normal limits. Spleen: Spleen intact and normal. Adrenals/Urinary Tract: Adrenal glands within normal limits. Kidneys equal size with symmetric enhancement. 5 cm cyst noted at the lower pole of the left kidney. Dish ule 1 cm cyst present at the left upper pole. No nephrolithiasis or hydronephrosis. No focal enhancing renal mass. No hydroureter. Partially distended bladder within normal limits. Stomach/Bowel: Stomach within normal limits. No evidence for bowel obstruction.  No acute bowel injury. Mild colonic diverticulosis without evidence for acute diverticulitis. Normal appendix. No acute inflammatory changes about the bowels. Vascular/Lymphatic: Normal intravascular enhancement seen throughout the intra-abdominal aorta. Moderate aortic atherosclerosis without aneurysm. Mesenteric vessels patent proximally. No adenopathy. Reproductive: Prostate normal. Other: Moderate-sized fat containing left inguinal hernia. Sequelae of prior right inguinal hernia repair with small residual and/or recurrent fat containing hernia. No free air or fluid. No mesenteric or retroperitoneal hematoma. Musculoskeletal: External soft tissues within normal limits. No acute fracture or other osseous abnormality. IMPRESSION: 1. No CT evidence for acute traumatic injury within the chest, abdomen, and pelvis. 2. No other acute  abnormality identified. 3. Mild colonic diverticulosis without evidence for acute diverticulitis. Electronically Signed   By: Jeannine Boga M.D.   On: 08/22/2018 14:17    Assessment/Plan: Doing well, ok to start asa 81 mg, hold xarelto for at least 1 wk  Estimated body mass index is 30.52 kg/m as calculated from the following:   Height as of this encounter: 6' (1.829 m).   Weight as of this encounter: 102.1 kg.    LOS: 1 day    JONES,DAVID S 08/23/2018, 7:39 AM

## 2018-08-23 NOTE — Discharge Instructions (Signed)
Head Injury, Adult There are many types of head injuries. Head injuries can be as minor as a bump, or they can be more severe. More severe head injuries include:  A jarring injury to the brain (concussion).  A bruise of the brain (contusion). This means there is bleeding in the brain that can cause swelling.  A cracked skull (skull fracture).  Bleeding in the brain that collects, clots, and forms a bump (hematoma).  After a head injury, you may need to be observed for a while in the emergency department or urgent care. Sometimes admission to the hospital is needed. After a head injury has happened, most problems occur within the first 24 hours, but side effects may occur up to 7-10 days after the injury. It is important to watch your condition for any changes. What are the causes? There are many possible causes of a head injury. A serious head injury may happen to someone who is in a car accident (motor vehicle collision). Other causes of major head injuries include bicycle or motorcycle accidents, sports injuries, and falls. Risk factors This condition is more likely to occur in people who:  Drink a lot of alcohol or use drugs.  Are over the age of 7.  Are at risk for falls.  What are the symptoms? There are many possible symptoms of a head injury. Visible symptoms of a head injury include a bruise, bump, or bleeding at the site of the injury. Other non-visible symptoms include:  Feeling sleepy or not being able to stay awake.  Passing out.  Headache.  Seizures.  Dizziness.  Confusion.  Memory problems.  Nausea or vomiting.  Other possible symptoms that may develop after the head injury include:  Poor attention and concentration.  Fatigue or tiring easily.  Irritability.  Being uncomfortable around bright lights or loud noises.  Anxiety or depression.  Disturbed sleep.  How is this diagnosed? This condition can usually be diagnosed based on your  symptoms, a description of the injury, and a physical exam. You may also have imaging tests done, such as a CT scan or MRI. You will also be closely watched. How is this treated? Treatment for this condition depends on the severity and type of injury you have. The main goal of treatment is to prevent complications and allow the brain time to heal. For mild head injury, you may be sent home and treatment may include:  Observation. A responsible adult should stay with you for 24 hours after your injury and check on you often.  Physical rest.  Brain rest.  Pain medicines.  For severe brain injury, treatment may include:  Close observation. This includes hospitalization with frequent physical exams. You may need to go to a hospital that specializes in head injury.  Pain medicines.  Breathing support. This may include using a ventilator.  Managing the pressure inside the brain (intracranial pressure, or ICP). This may include: ? Monitoring the ICP. ? Giving medicines to decrease the ICP. ? Positioning you to decrease the ICP.  Medicine to prevent seizures.  Surgery to stop bleeding or to remove blood clots (craniotomy).  Surgery to remove part of the skull (decompressive craniectomy). This allows room for the brain to swell.  Follow these instructions at home: Activity  Rest as much as possible and avoid activities that are physically hard or tiring.  Make sure you get enough sleep.  Limit activities that require a lot of thought or attention, such as: ? Watching TV. ?  Playing memory games and puzzles. ? Job-related work or homework. ? Working on [employer], and texting.  Avoid activities that could cause another head injury, such as playing sports, until your health care provider approves. Having another head injury, especially before the first one has healed, can be dangerous.  Ask your health care provider when it is safe for you to return to your regular  activities, including work or school. Ask your health care provider for a step-by-step plan for gradually returning to activities.  Ask your health care provider when you can drive, ride a bicycle, or use heavy machinery. Your ability to react may be slower after a brain injury. Never do these activities if you are dizzy.  Lifestyle  Do not drink alcohol until your health care provider approves, and avoid drug use. Alcohol and certain drugs may slow your recovery and can put you at risk of further injury.  If it is harder than usual to remember things, write them down.  If you are easily distracted, try to do one thing at a time.  Talk with family members or close friends when making important decisions.  Tell your friends, family, a trusted colleague, and work [employer] about your injury, symptoms, and restrictions. Have them watch for any new or worsening problems.  General instructions  Take over-the-counter and prescription medicines only as told by your health care provider.  Have someone stay with you for 24 hours after your head injury. This person should watch you for any changes in your symptoms and be ready to seek medical help, as needed.  Keep all follow-up visits as told by your health care provider. This is important.  Prevention  Work on improving your balance and strength to avoid falls.  Wear a seatbelt when you are in a moving vehicle.  Wear a helmet when riding a bicycle, skiing, or doing any other sport or activity that has a risk of injury.  Drink alcohol only in moderation.  Take safety measures in your home, such as: ? Removing clutter and tripping hazards from floors and stairways. ? Using grab bars in bathrooms and handrails by stairs. ? Placing non-slip mats on floors and in bathtubs. ? Improving lighting in dim areas. Get help right away if:  You have: ? A severe headache that is not helped by medicine. ? Trouble walking, have weakness in your arms  and legs, or lose your balance. ? Clear or bloody fluid coming from your nose or ears. ? Changes in your vision. ? A seizure.  You vomit.  Your symptoms get worse.  Your speech is slurred.  You pass out.  You are sleepier and have trouble staying awake.  Your pupils change size. These symptoms may represent a serious problem that is an emergency. Do not wait to see if the symptoms will go away. Get medical help right away. Call your local emergency services (911 in the [place] Do not drive yourself to the hospital. This information is not intended to replace advice given to you by your health care provider. Make sure you discuss any questions you have with your health care provider. Document Released: 11/27/2005 Document Revised: 06/23/2016 Document Reviewed: 06/06/2016 Elsevier Interactive Patient Education  2017 [place] your xarelto on 9/21

## 2018-08-23 NOTE — Progress Notes (Signed)
Per Margo Aye, NP with Dr Ronnald Ramp, the patient is to follow-up with Dr Ronnald Ramp in his office 1 week after discharge. [place], [place]

## 2018-08-23 NOTE — Care Management Note (Signed)
Case Management Note  Patient Details  Name: Jason Neal MRN: [id number] Date of Birth: 06-24-1951  Subjective/Objective:  Patient with SAH, for dc today, per pt eval rec outpt physical therapy for balance defecits,  Patient wanted to go to [facility] outpatient physical therapy but they state they refer balance deficits with [facility] to [facility], this information was given to patient.  NCM sent referral to [facility] outpatient Neuro physical therapy.                  Action/Plan: DC home when ready.  Expected Discharge Date:  08/23/18               Expected Discharge Plan:  OP Rehab  In-House Referral:     Discharge planning Services  CM Consult  Post Acute Care Choice:    Choice offered to:  Patient  DME Arranged:    DME Agency:     HH Arranged:    [place] Agency:     Status of Service:  Completed, signed off  If discussed at [employer] of Stay Meetings, dates discussed:    Additional Comments:  Zenon Mayo, RN 08/23/2018, 2:35 PM

## 2018-08-23 NOTE — Discharge Summary (Signed)
Physician Discharge Summary         Patient ID: Jason Neal MRN: [id number] DOB/AGE: 69-Jan-1952 69 y.o.  Admit date: 08/22/2018 Discharge date: 08/23/2018  Discharge Diagnoses:    Traumatic subdural hematoma Traumatic subarachnoid hemorrhage Hypertensive urgency History of atrial fibrillation History of coronary artery disease History of obstructive sleep apnea Diabetes type 2 Chronic kidney disease stage III Recurrent sinusitis   Discharge summary    67 year old male patient who was admitted on 9/12 after falling off from a tractor where he sustained a traumatic right subdural and left frontal subarachnoid hemorrhage.  On admission he was hypertensive requiring Cardene drip for blood pressure management.  He was admitted to the intensive care, neurosurgery was consulted, his blood pressure was managed on a Cardene infusion.  On 9/13 his Cardene drip was discontinued was seen by neurosurgery In follow-up.  He was told he could start baby aspirin 81 mg daily, however instructed that he should hold off from resuming Xarelto for at least 1 week.  As of 9/13 he was deemed ready for discharge.   Discharge Plan by Active Problems    Traumatic subdural hematoma and subarachnoid hemorrhage. -Recommended outpatient PT Plan Dc to home  F/u w/ neurosurgery to be arranged  No driving  Walk w/ assist   History of hypertension, coronary artery disease, and hyperlipidemia Plan Resume 81 mg aspirin today Hold off on Xarelto on the 21st Continue Lipitor Continue Coreg, Cardizem, hydralazine, and Cozaar  History of sleep apnea Plan Follow-up with PCP and cardiology to arrange new CPAP mask  Diabetes type 2 Plan Continuing diet management  Recurrent sinusitis Plan Day 2 of 7 Augmentin Flonase and nasal irrigation at home  CKD stage III Plan Follow-up outpatient   Significant Hospital tests/ studies  CT head 9/12 >> posterior scalp hematoma, small Rt tentorial SDH,  small Lt frontal SAH (reviewed by me) CT neck 9/12 >> fusion C6-7 (reviewed by me)  Culture data/antimicrobials    Augmentin 9/12 Consults   Neurosurgeon consulted 9/12: Dr. Sherley Bounds.  Recommended observational management including serial neurological testing and follow-ups imaging  Discharge Exam: Blood Pressure (Abnormal) 124/56   Pulse (Abnormal) 56   Temperature 97.7 F (36.5 C) (Oral)   Respiration 19   Height 6' (1.829 m)   Weight 102.1 kg   Oxygen Saturation 92%   Body Mass Index 30.52 kg/m   General: Awake alert no focal deficits HEENT normocephalic atraumatic no jugular venous distention Cardiac: Regular rate and rhythm 2 out of 6 systolic murmur Pulmonary: Clear to auscultation no accessory use Abdomen: Soft nontender no organomegaly Extremities: Warm and dry no edema Neuro: Awake oriented no focal deficits  Labs at discharge   Lab Results  Component Value Date   CREATININE 1.58 (H) 08/23/2018   BUN 21 08/23/2018   NA 138 08/23/2018   K 3.7 08/23/2018   CL 107 08/23/2018   CO2 24 08/23/2018   Lab Results  Component Value Date   WBC 7.7 08/23/2018   HGB 13.1 08/23/2018   HCT 40.1 08/23/2018   MCV 89.7 08/23/2018   PLT 237 08/23/2018   Lab Results  Component Value Date   ALT 24 08/22/2018   AST 31 08/22/2018   ALKPHOS 49 08/22/2018   BILITOT 0.7 08/22/2018   Lab Results  Component Value Date   INR 1.15 08/22/2018   INR 1.42 03/07/2012   INR 0.92 03/04/2012    Current radiological studies    Ct Head Wo Contrast  Result  Date: 08/23/2018 CLINICAL DATA:  67 y/o  M; head trauma for follow-up. EXAM: CT HEAD WITHOUT CONTRAST TECHNIQUE: Contiguous axial images were obtained from the base of the skull through the vertex without intravenous contrast. COMPARISON:  08/22/2018 CT head. FINDINGS: Brain: Stable left frontal small volume subarachnoid hemorrhage. Stable thin subdural hematoma along the right tentorium cerebelli and falx. No new acute  intracranial hemorrhage, mass effect, stroke, extra-axial collection, hydrocephalus, or herniation. Vascular: Calcific atherosclerosis of carotid siphons. No hyperdense vessel. Skull: Normal. Negative for fracture or focal lesion. Right parietal scalp contusion. Sinuses/Orbits: Mild maxillary and ethmoid sinus mucosal thickening. Trace opacification of mastoid air cells. Orbits are unremarkable. Other: None. IMPRESSION: Stable small left frontal subarachnoid hemorrhage. Stable right tentorium and falx thin subdural hematoma. No mass effect or herniation. No new acute intracranial abnormality. Stable right parietal scalp contusion. Electronically Signed   By: Kristine Garbe M.D.   On: 08/23/2018 06:47   Ct Head Wo Contrast  Result Date: 08/22/2018 CLINICAL DATA:  Head injury after fall. Positive loss of consciousness. EXAM: CT HEAD WITHOUT CONTRAST CT CERVICAL SPINE WITHOUT CONTRAST TECHNIQUE: Multidetector CT imaging of the head and cervical spine was performed following the standard protocol without intravenous contrast. Multiplanar CT image reconstructions of the cervical spine were also generated. COMPARISON:  None. FINDINGS: CT HEAD FINDINGS Brain: Moderate size posterior scalp hematoma is noted. Small right-sided tentorial subdural hematoma is noted. No mass effect or midline shift is noted. Ventricular size is within normal limits. Small amount of left frontal subarachnoid hemorrhage is noted as well. There is no evidence of mass lesion or acute infarction. Vascular: No hyperdense vessel or unexpected calcification. Skull: Normal. Negative for fracture or focal lesion. Sinuses/Orbits: No acute finding. Other: Fluid is noted in bilateral mastoid air cells. CT CERVICAL SPINE FINDINGS Alignment: Normal. Skull base and vertebrae: No acute fracture. No primary bone lesion or focal pathologic process. Soft tissues and spinal canal: No prevertebral fluid or swelling. No visible canal hematoma. Disc  levels: Status post surgical fusion of C6-7 disc space. Mild anterior osteophyte formation is noted at C4-5 and C5-6. Upper chest: Negative. Other: Degenerative changes are seen involving posterior facet joints bilaterally. IMPRESSION: Small right-sided tentorial subdural hematoma is noted as well as small focus of left frontal subarachnoid hemorrhage. Critical Value/emergent results were called by telephone at the time of interpretation on 08/22/2018 at 1:52 pm to Dr. Wyn Quaker , who verbally acknowledged these results. Moderate size posterior scalp hematoma. Postsurgical and degenerative changes noted in the cervical spine. No fracture or spondylolisthesis is noted. Electronically Signed   By: Marijo Conception, M.D.   On: 08/22/2018 13:53   Ct Chest W Contrast  Result Date: 08/22/2018 CLINICAL DATA:  Initial evaluation for acute trauma, fall. Right flank pain. EXAM: CT CHEST, ABDOMEN, AND PELVIS WITH CONTRAST TECHNIQUE: Multidetector CT imaging of the chest, abdomen and pelvis was performed following the standard protocol during bolus administration of intravenous contrast. CONTRAST:  177mL ISOVUE-300 IOPAMIDOL (ISOVUE-300) INJECTION 61% COMPARISON:  None. FINDINGS: CT CHEST FINDINGS Cardiovascular: Intrathoracic aorta of normal caliber without aneurysm or other acute abnormality. Mild atheromatous plaque within the descending intrathoracic aorta. Visualized great vessels intact and normal. Heart size within normal limits. Diffuse 3 vessel coronary artery calcifications with aortic valvular calcifications. No pericardial effusion. Limited evaluation of the pulmonary arterial tree unremarkable. Mediastinum/Nodes: Thyroid normal. No enlarged mediastinal, hilar, or axillary lymph nodes. Esophagus within normal limits. Lungs/Pleura: Tracheobronchial tree intact and patent. Lungs well inflated bilaterally. Mild subsegmental  atelectatic changes noted dependently within the lower lobes bilaterally. No focal  infiltrates or evidence for contusion. No pulmonary edema or pleural effusion. No pneumothorax. No worrisome pulmonary nodule or mass. Musculoskeletal: External soft tissues demonstrate no acute finding. No acute osseous abnormality. No discrete lytic or blastic osseous lesions. Cervical fusion partially visualized. CT ABDOMEN PELVIS FINDINGS Hepatobiliary: Liver demonstrates a normal contrast enhanced appearance. Gallbladder within normal limits. No biliary dilatation. Pancreas: Pancreas within normal limits. Spleen: Spleen intact and normal. Adrenals/Urinary Tract: Adrenal glands within normal limits. Kidneys equal size with symmetric enhancement. 5 cm cyst noted at the lower pole of the left kidney. Dish ule 1 cm cyst present at the left upper pole. No nephrolithiasis or hydronephrosis. No focal enhancing renal mass. No hydroureter. Partially distended bladder within normal limits. Stomach/Bowel: Stomach within normal limits. No evidence for bowel obstruction. No acute bowel injury. Mild colonic diverticulosis without evidence for acute diverticulitis. Normal appendix. No acute inflammatory changes about the bowels. Vascular/Lymphatic: Normal intravascular enhancement seen throughout the intra-abdominal aorta. Moderate aortic atherosclerosis without aneurysm. Mesenteric vessels patent proximally. No adenopathy. Reproductive: Prostate normal. Other: Moderate-sized fat containing left inguinal hernia. Sequelae of prior right inguinal hernia repair with small residual and/or recurrent fat containing hernia. No free air or fluid. No mesenteric or retroperitoneal hematoma. Musculoskeletal: External soft tissues within normal limits. No acute fracture or other osseous abnormality. IMPRESSION: 1. No CT evidence for acute traumatic injury within the chest, abdomen, and pelvis. 2. No other acute abnormality identified. 3. Mild colonic diverticulosis without evidence for acute diverticulitis. Electronically Signed   By:  Jeannine Boga M.D.   On: 08/22/2018 14:17   Ct Cervical Spine Wo Contrast  Result Date: 08/22/2018 CLINICAL DATA:  Head injury after fall. Positive loss of consciousness. EXAM: CT HEAD WITHOUT CONTRAST CT CERVICAL SPINE WITHOUT CONTRAST TECHNIQUE: Multidetector CT imaging of the head and cervical spine was performed following the standard protocol without intravenous contrast. Multiplanar CT image reconstructions of the cervical spine were also generated. COMPARISON:  None. FINDINGS: CT HEAD FINDINGS Brain: Moderate size posterior scalp hematoma is noted. Small right-sided tentorial subdural hematoma is noted. No mass effect or midline shift is noted. Ventricular size is within normal limits. Small amount of left frontal subarachnoid hemorrhage is noted as well. There is no evidence of mass lesion or acute infarction. Vascular: No hyperdense vessel or unexpected calcification. Skull: Normal. Negative for fracture or focal lesion. Sinuses/Orbits: No acute finding. Other: Fluid is noted in bilateral mastoid air cells. CT CERVICAL SPINE FINDINGS Alignment: Normal. Skull base and vertebrae: No acute fracture. No primary bone lesion or focal pathologic process. Soft tissues and spinal canal: No prevertebral fluid or swelling. No visible canal hematoma. Disc levels: Status post surgical fusion of C6-7 disc space. Mild anterior osteophyte formation is noted at C4-5 and C5-6. Upper chest: Negative. Other: Degenerative changes are seen involving posterior facet joints bilaterally. IMPRESSION: Small right-sided tentorial subdural hematoma is noted as well as small focus of left frontal subarachnoid hemorrhage. Critical Value/emergent results were called by telephone at the time of interpretation on 08/22/2018 at 1:52 pm to Dr. Wyn Quaker , who verbally acknowledged these results. Moderate size posterior scalp hematoma. Postsurgical and degenerative changes noted in the cervical spine. No fracture or  spondylolisthesis is noted. Electronically Signed   By: Marijo Conception, M.D.   On: 08/22/2018 13:53   Ct Abdomen Pelvis W Contrast  Result Date: 08/22/2018 CLINICAL DATA:  Initial evaluation for acute trauma, fall. Right flank pain.  EXAM: CT CHEST, ABDOMEN, AND PELVIS WITH CONTRAST TECHNIQUE: Multidetector CT imaging of the chest, abdomen and pelvis was performed following the standard protocol during bolus administration of intravenous contrast. CONTRAST:  125mL ISOVUE-300 IOPAMIDOL (ISOVUE-300) INJECTION 61% COMPARISON:  None. FINDINGS: CT CHEST FINDINGS Cardiovascular: Intrathoracic aorta of normal caliber without aneurysm or other acute abnormality. Mild atheromatous plaque within the descending intrathoracic aorta. Visualized great vessels intact and normal. Heart size within normal limits. Diffuse 3 vessel coronary artery calcifications with aortic valvular calcifications. No pericardial effusion. Limited evaluation of the pulmonary arterial tree unremarkable. Mediastinum/Nodes: Thyroid normal. No enlarged mediastinal, hilar, or axillary lymph nodes. Esophagus within normal limits. Lungs/Pleura: Tracheobronchial tree intact and patent. Lungs well inflated bilaterally. Mild subsegmental atelectatic changes noted dependently within the lower lobes bilaterally. No focal infiltrates or evidence for contusion. No pulmonary edema or pleural effusion. No pneumothorax. No worrisome pulmonary nodule or mass. Musculoskeletal: External soft tissues demonstrate no acute finding. No acute osseous abnormality. No discrete lytic or blastic osseous lesions. Cervical fusion partially visualized. CT ABDOMEN PELVIS FINDINGS Hepatobiliary: Liver demonstrates a normal contrast enhanced appearance. Gallbladder within normal limits. No biliary dilatation. Pancreas: Pancreas within normal limits. Spleen: Spleen intact and normal. Adrenals/Urinary Tract: Adrenal glands within normal limits. Kidneys equal size with symmetric  enhancement. 5 cm cyst noted at the lower pole of the left kidney. Dish ule 1 cm cyst present at the left upper pole. No nephrolithiasis or hydronephrosis. No focal enhancing renal mass. No hydroureter. Partially distended bladder within normal limits. Stomach/Bowel: Stomach within normal limits. No evidence for bowel obstruction. No acute bowel injury. Mild colonic diverticulosis without evidence for acute diverticulitis. Normal appendix. No acute inflammatory changes about the bowels. Vascular/Lymphatic: Normal intravascular enhancement seen throughout the intra-abdominal aorta. Moderate aortic atherosclerosis without aneurysm. Mesenteric vessels patent proximally. No adenopathy. Reproductive: Prostate normal. Other: Moderate-sized fat containing left inguinal hernia. Sequelae of prior right inguinal hernia repair with small residual and/or recurrent fat containing hernia. No free air or fluid. No mesenteric or retroperitoneal hematoma. Musculoskeletal: External soft tissues within normal limits. No acute fracture or other osseous abnormality. IMPRESSION: 1. No CT evidence for acute traumatic injury within the chest, abdomen, and pelvis. 2. No other acute abnormality identified. 3. Mild colonic diverticulosis without evidence for acute diverticulitis. Electronically Signed   By: Jeannine Boga M.D.   On: 08/22/2018 14:17    Disposition:    Discharge disposition: 01-Home or Self Care       Discharge Instructions    Call MD for:   Complete by:  As directed    Worsening head ache  Change in mental status Confusion Frequent falls   Diet - low sodium heart healthy   Complete by:  As directed    Discharge instructions   Complete by:  As directed    Please no walking outside without assistance  No driving until cleared by doctor.   Increase activity slowly   Complete by:  As directed      Allergies as of 08/23/2018    Allergen Reactions Comment   Angiotensin Receptor Blockers Cough     Demeclocycline Other (See Comments)       Medication List    Stop taking these medications   ofloxacin 0.3 % OTIC solution Commonly known as:  FLOXIN   omega-3 acid ethyl esters 1 g capsule Commonly known as:  LOVAZA     Take these medications   ACCU-CHEK AVIVA PLUS test strip Generic drug:  glucose blood   accu-chek multiclix  lancets   amoxicillin-clavulanate 875-125 MG tablet Commonly known as:  AUGMENTIN Take 1 tablet by mouth every 12 (twelve) hours for 5 days.   aspirin EC 81 MG tablet Take 1 tablet (81 mg total) by mouth daily.   atorvastatin 40 MG tablet Commonly known as:  LIPITOR Take 40 mg by mouth daily.   B-complex with vitamin C tablet Take 1 tablet by mouth daily.   calcium carbonate 500 MG chewable tablet Commonly known as:  TUMS - dosed in mg elemental calcium Chew 1 tablet by mouth daily as needed for indigestion or heartburn.   carvedilol 6.25 MG tablet Commonly known as:  COREG Take 1 tablet by mouth 2 (two) times daily.   cetirizine 10 MG tablet Commonly known as:  ZYRTEC Take 5 mg by mouth daily as needed for allergies.   cholecalciferol 1000 units tablet Commonly known as:  VITAMIN D Take 1,000-2,000 Units by mouth See admin instructions. 2000units in am, 1000 units in pm   diltiazem 90 MG 12 hr capsule Commonly known as:  CARDIZEM SR TAKE ONE CAPSULE BY MOUTH TWICE A DAY   fluticasone 50 MCG/ACT nasal spray Commonly known as:  FLONASE Place 1 spray into the nose as needed for allergies.   GAS-X 80 MG chewable tablet Generic drug:  simethicone Chew 80 mg by mouth every 6 (six) hours as needed for flatulence.   hydrALAZINE 50 MG tablet Commonly known as:  APRESOLINE Take 50 mg by mouth 2 (two) times daily.   losartan 100 MG tablet Commonly known as:  COZAAR TAKE 1 TABLET DAILY   nitroGLYCERIN 0.4 MG SL tablet Commonly known as:  NITROSTAT Place 0.4 mg under the tongue every 5 (five) minutes as needed for chest pain.     pantoprazole 40 MG tablet Commonly known as:  PROTONIX Take 40 mg by mouth every morning.   rivaroxaban 20 MG Tabs tablet Commonly known as:  XARELTO Take 1 tablet (20 mg total) by mouth daily. Resume this on 9/21 What changed:    how much to take  additional instructions   sodium chloride 0.65 % Soln nasal spray Commonly known as:  OCEAN Place 2 sprays into both nostrils 2 (two) times daily.   SYSTANE 0.4-0.3 % Soln Generic drug:  Polyethyl Glycol-Propyl Glycol Place 1 drop into both eyes as needed (dry eyes).      Follow-up Information    [facility]   Specialty:  Rehabilitation Why:  they will contact you to schedule apt, if you do not hear from them by Tuesday, please give them a call . Contact information: [address] [place] [id number] [place] [address] [phone]          Discharge Condition:    good  Physician Statement:   The Patient was personally examined, the discharge assessment and plan has been personally reviewed and I agree with ACNP Taneshia Lorence's assessment and plan. 32 minutes of time have been dedicated to discharge assessment, planning and discharge instructions.   Signed: Clementeen Graham 08/23/2018, 3:31 PM

## 2018-08-23 NOTE — Progress Notes (Signed)
PULMONARY / CRITICAL CARE MEDICINE   NAME:  Jason Neal, MRN:  [id number], DOB:  Oct 22, 1951, LOS: 1 ADMISSION DATE:  08/22/2018, CONSULTATION DATE:  08/22/2018 REFERRING MD:  Dr. Alvino Chapel, ER, CHIEF COMPLAINT:  Headache  BRIEF HISTORY:    67 yo male with traumatic Rt SDH and Lt frontal SAH after falling off a tractor.  Noted to have elevated BP and started on cardene gtt.  SIGNIFICANT PAST MEDICAL HISTORY   OSA, HTN, DM, CKD 3, PAF on xarelto, HLD, GERD, CAD, mild AS  SIGNIFICANT EVENTS:  9/12 Admit 9/13 Off cardene, transfer to telemetry  STUDIES:   CT head 9/12 >> posterior scalp hematoma, small Rt tentorial SDH, small Lt frontal SAH (reviewed by me) CT neck 9/12 >> fusion C6-7 (reviewed by me)  CULTURES:    ANTIBIOTICS:  Augmentin 9/12 >>   LINES/TUBES:    CONSULTANTS:  Neurosurgery 9/12 SAH, SDH >>  SUBJECTIVE:  Feels weak and achy.  Headache with coughing.  Couldn't use CPAP overnight.  CONSTITUTIONAL: BP (!) 137/53 (BP Location: Right Arm)   Pulse (!) 58   Temp 98 F (36.7 C) (Oral)   Resp (!) 21   Ht 6' (1.829 m)   Wt 102.1 kg   SpO2 93%   BMI 30.52 kg/m   I/O last 3 completed shifts: In: 1371.1 [I.V.:871.1; IV Piggyback:500] Out: 1500 [Urine:1500]  PHYSICAL EXAM:  General - alert Eyes - pupils reactive ENT - no sinus tenderness, no stridor Cardiac - regular rate/rhythm, 2/6 murmur Chest - equal breath sounds b/l, no wheezing or rales Abdomen - soft, non tender, + bowel sounds Extremities - no cyanosis, clubbing, or edema Skin - no rashes Neuro - CN intact, normal strength, moves extremities, follows commands Psych - normal mood and behavior  ASSESSMENT AND PLAN    Traumatic SDH, SAH. - assess mobility before considering d/c home - will need to have neurosurgery determine when he would need outpt f/u with them  Hypertension urgency. Hx of CAD, A fib, HTN, HLD. - okay to resume ASA on 9/13 per neurosurgery - hold xarelto for one week   - continue lipitor - resume coreg, cardizem, hydralazine, cozaar  Hx of OSA. - CPAP qhs as able - might need to try alternative mask as outpt (uses nasal pillows currently) - he will f/u with PCP and cardiology to arrange for new CPAP machine  DM type II. - SSI  CKD 3. - f/u BMET  Recurrent sinusitis. - day 2 of augmentin - flonase, nasal irrigation   SUMMARY OF TODAY'S PLAN:  Need to assess his mobility, tolerance of diet, and ability to use bathroom before determining when he can go home.  Will transfer to tele.  Will have to keep on PCCM service since he will likely ready for d/c home in next 24 hours.  Best Practice / Goals of Care / Disposition.   DVT PROPHYLAXIS: SCDs SUP: Protonix NUTRITION: heart healthy, carb modified MOBILITY: activity as tolerated GOALS OF CARE: full code FAMILY DISCUSSIONS: updated family at bedside 9/13  LABS  Glucose Recent Labs  Lab 08/22/18 2116 08/23/18 0818  GLUCAP 106* 152*    BMET Recent Labs  Lab 08/22/18 1225 08/22/18 1239 08/23/18 0325  NA 139 141 138  K 3.9 3.9 3.7  CL 106 107 107  CO2 22  --  24  BUN 22 24* 21  CREATININE 1.49* 1.50* 1.58*  GLUCOSE 168* 169* 152*    Liver Enzymes Recent Labs  Lab 08/22/18 1225  AST 31  ALT 24  ALKPHOS 49  BILITOT 0.7  ALBUMIN 4.0    Electrolytes Recent Labs  Lab 08/22/18 1225 08/23/18 0325  CALCIUM 9.9 9.1    CBC Recent Labs  Lab 08/22/18 1225 08/22/18 1239 08/22/18 1924 08/23/18 0325  WBC 7.3  --  8.3 7.7  HGB 13.9 14.3 13.7 13.1  HCT 42.7 42.0 42.3 40.1  PLT 229  --  244 237    ABG No results for input(s): PHART, PCO2ART, PO2ART in the last 168 hours.  Coag's Recent Labs  Lab 08/22/18 1924  APTT 37*  INR 1.15    Sepsis Markers No results for input(s): LATICACIDVEN, PROCALCITON, O2SATVEN in the last 168 hours.  Cardiac Enzymes No results for input(s): TROPONINI, PROBNP in the last 168 hours.   Chesley Mires, MD [facility] Pulmonary/Critical  Care 08/23/2018, 10:04 AM

## 2018-10-02 ENCOUNTER — Other Ambulatory Visit: Payer: Self-pay | Admitting: Sports Medicine

## 2018-10-02 DIAGNOSIS — M25511 Pain in right shoulder: Secondary | ICD-10-CM

## 2018-10-16 ENCOUNTER — Other Ambulatory Visit: Payer: Medicare Other

## 2019-02-06 ENCOUNTER — Other Ambulatory Visit: Payer: Self-pay | Admitting: Sports Medicine

## 2019-02-06 DIAGNOSIS — M545 Low back pain, unspecified: Secondary | ICD-10-CM

## 2019-02-13 ENCOUNTER — Ambulatory Visit
Admission: RE | Admit: 2019-02-13 | Discharge: 2019-02-13 | Disposition: A | Discharge status: Home / Self Care | Payer: Medicare Other | Source: Ambulatory Visit | Attending: Sports Medicine | Admitting: Sports Medicine

## 2019-02-13 DIAGNOSIS — M545 Low back pain, unspecified: Secondary | ICD-10-CM

## 2019-02-13 IMAGING — CT CT L SPINE W/O CM
3 of 4 series · 13 of 33 positions shown, 16 images · non-contrast
Comparison: CT abdomen and pelvis [DATE]

CLINICAL DATA: Low back, left hip, and leg pain. Fall in [DATE].

EXAM:
CT LUMBAR SPINE WITHOUT CONTRAST
TECHNIQUE: Multidetector CT imaging of the lumbar spine was performed without
intravenous contrast administration. Multiplanar CT image
reconstructions were also generated.

[Series 3: l-spine 2.00 br40 s3 lspine st · axial · 0.36mm/px · z∈[+1441,+1597]mm · 5 of 118 slices shown, 7 images]
[im 20/118  soft-tissue]
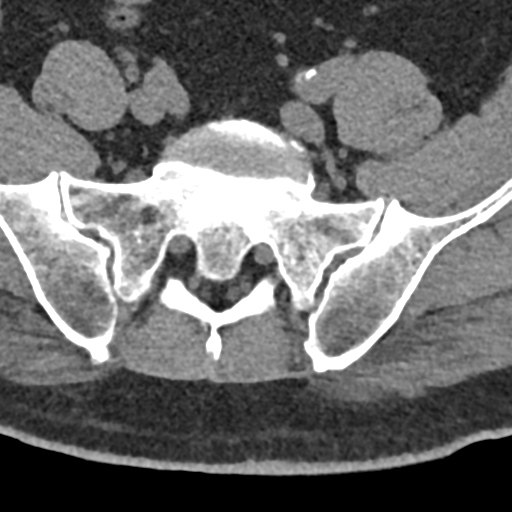
[im 20/118  bone]
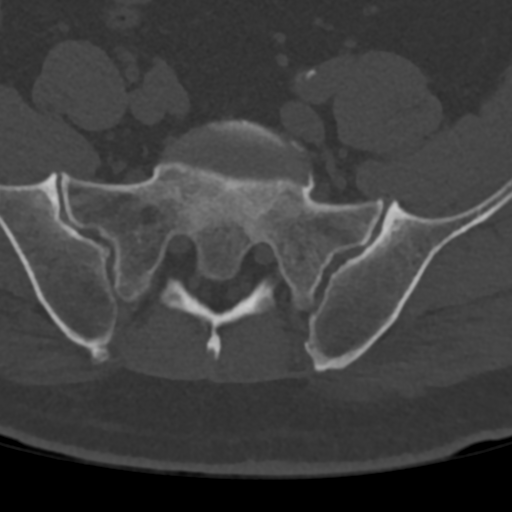
[im 40/118  bone]
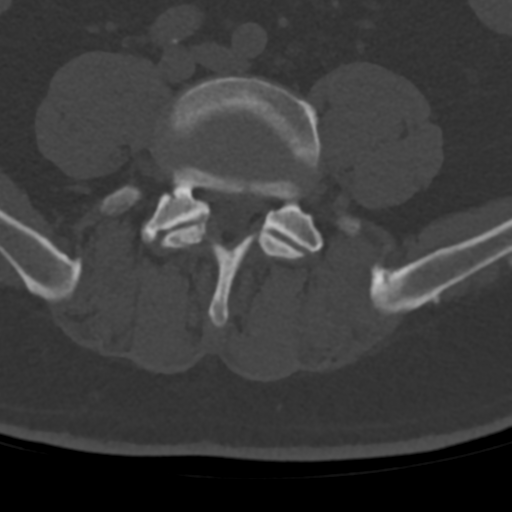
[im 59/118  bone]
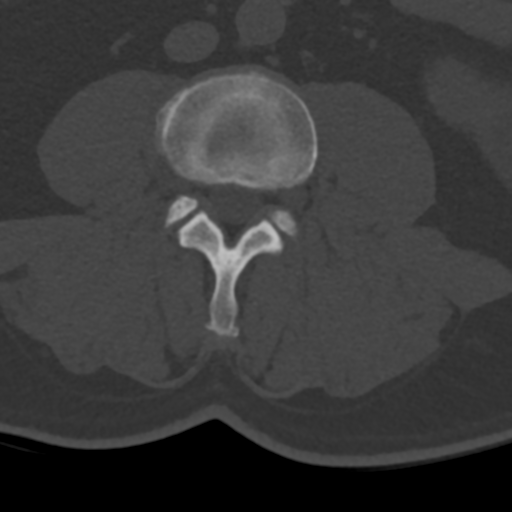
[im 79/118  bone]
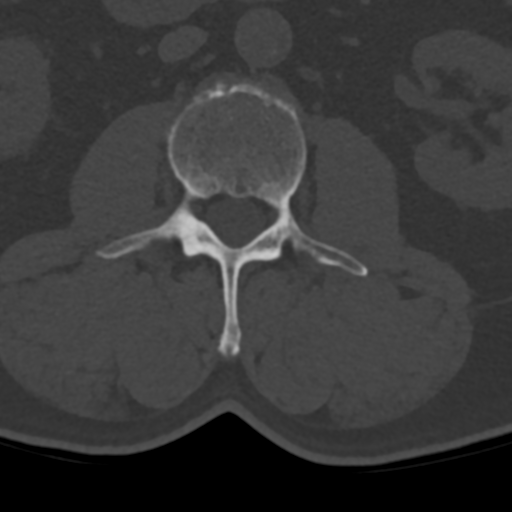
[im 98/118  soft-tissue]
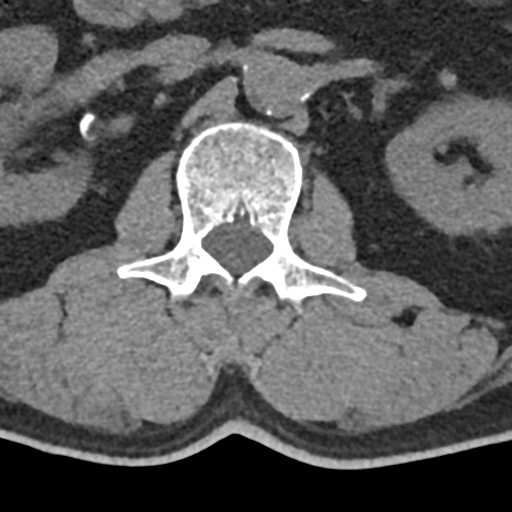
[im 98/118  bone]
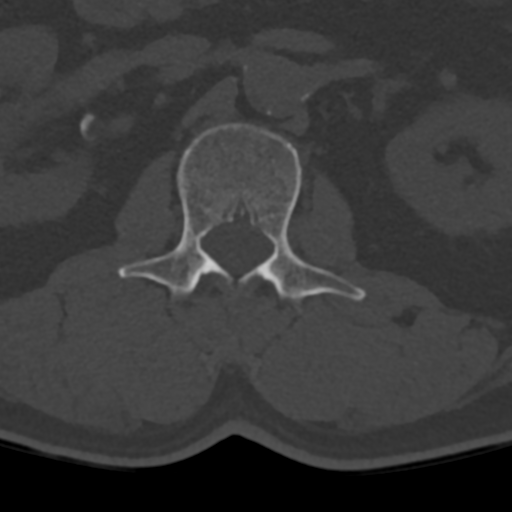

[Series 5: l-spine 2.00 br60 s3 sag sag bone · sagittal · 0.34mm/px · 5 of 95 slices shown, 6 images]
[im 32/95  bone]
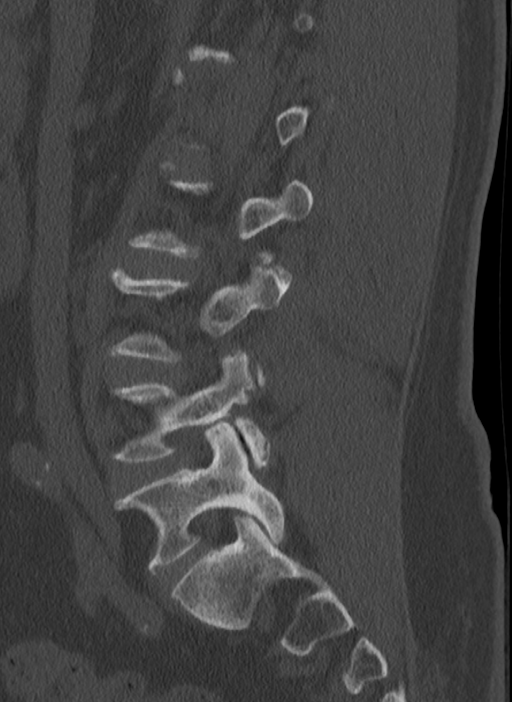
[im 40/95  bone]
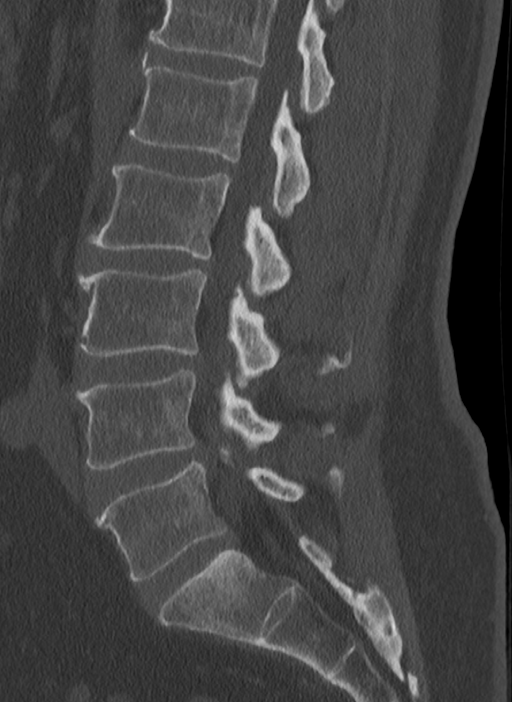
[im 48/95  soft-tissue]
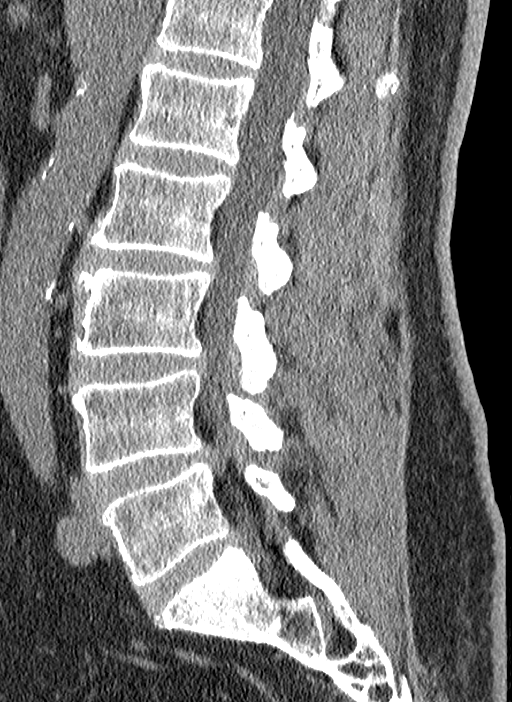
[im 48/95  bone]
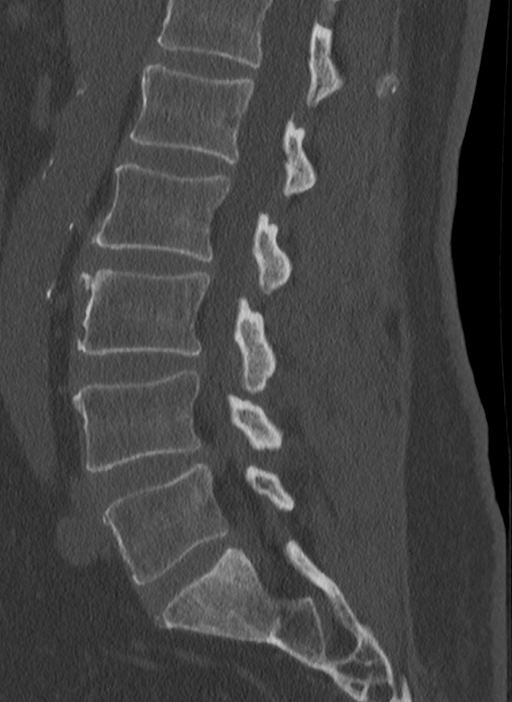
[im 55/95  bone]
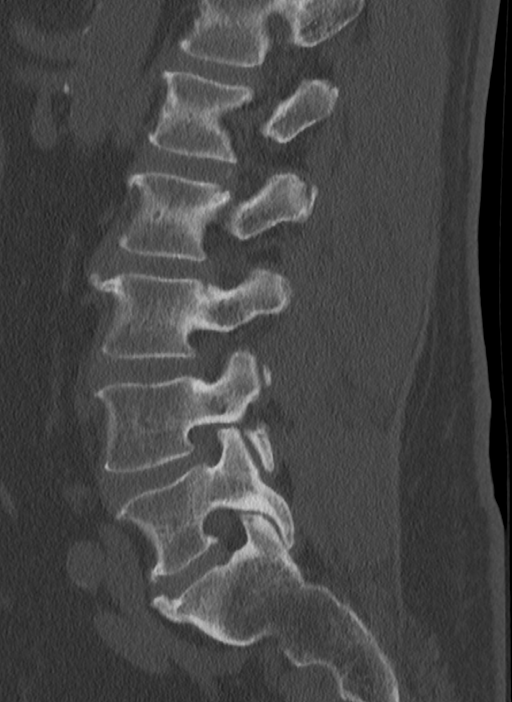
[im 63/95  bone]
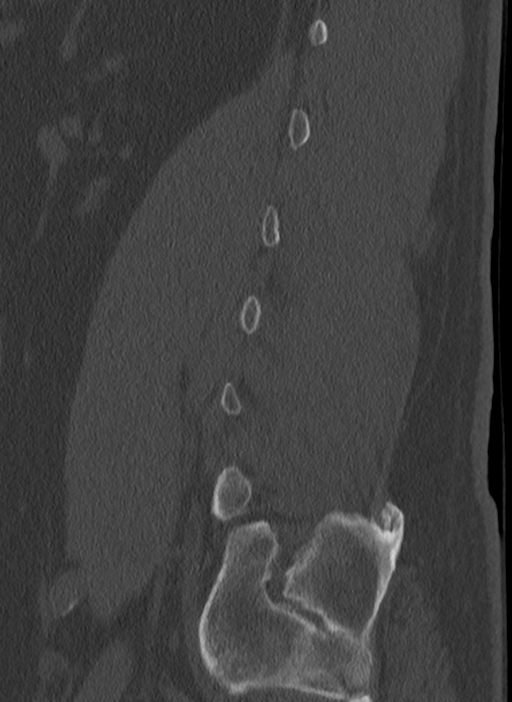

[Series 7: l-spine 2.00 br60 s3 cor cor bone · coronal · 0.37mm/px · 3 of 86 slices shown]
[im 18/86  bone]
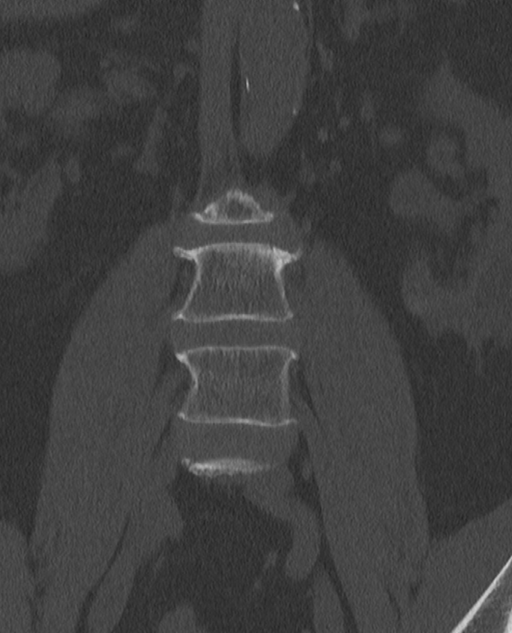
[im 35/86  bone]
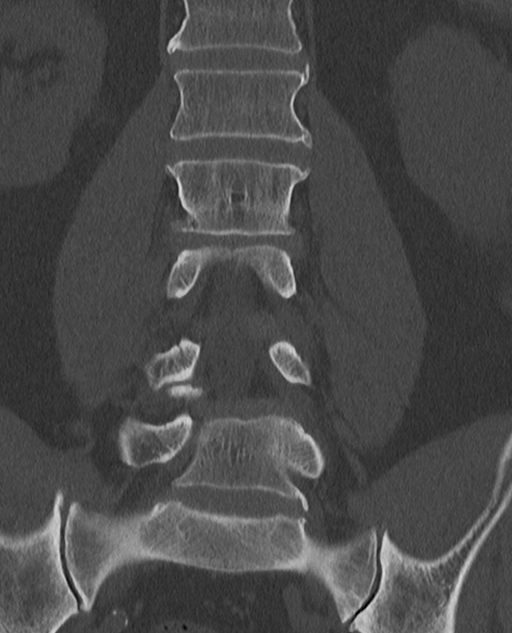
[im 52/86  bone]
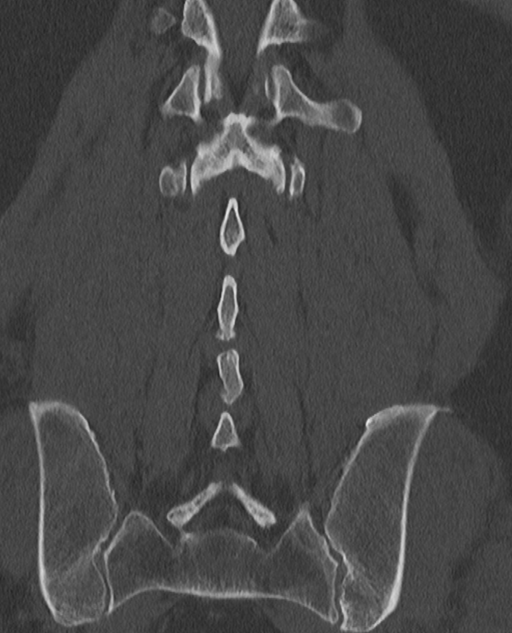

[13 of 33 positions shown; findings below may reference images not displayed]

FINDINGS: Segmentation: 5 non rib-bearing lumbar type vertebrae. Only a
rudimentary right-sided rib is present at T12.

Alignment: Normal.

Vertebrae: No fracture or suspicious osseous lesion. Mild multilevel
marginal endplate osteophyte formation. Slight disc space narrowing
at L2-3.

Paraspinal and other soft tissues: Aortic atherosclerosis without
aneurysm. Partially visualized low-density left lower pole renal
lesion corresponding to a cyst on the prior study. 9 mm hyperdense
lesion in the lower pole of the left kidney compatible with a
hemorrhagic or proteinaceous cyst.

Disc levels: Mild leftward disc bulging and mild left facet spurring
without stenosis.

L2-3: Disc bulging greater to the right and mild facet hypertrophy
result in mild-to-moderate right neural foraminal stenosis without
spinal stenosis.

L3-4: Mild disc bulging results in mild right greater than left
neural foraminal stenosis without spinal stenosis.

L4-5: Disc bulging and mild facet hypertrophy result in
mild-to-moderate bilateral neural foraminal stenosis without spinal
stenosis.

L5-S1: Minimal disc bulging without stenosis.
IMPRESSION: Mild lumbar disc and facet degeneration resulting in
mild-to-moderate neural foraminal stenosis as above. No evidence of
significant spinal stenosis.

Aortic Atherosclerosis ([8M]-[8M]).

## 2019-03-12 ENCOUNTER — Ambulatory Visit: Admit: 2019-03-12 | Payer: Medicare Other | Admitting: Ophthalmology

## 2019-03-12 SURGERY — PHACOEMULSIFICATION, CATARACT, WITH IOL INSERTION
Anesthesia: Topical | Laterality: Right

## 2019-05-16 ENCOUNTER — Telehealth: Payer: Self-pay | Admitting: Cardiovascular Disease

## 2019-05-16 NOTE — Telephone Encounter (Signed)
  [facility] to change appt to in office appt on 07/02/19. Dr Gwenlyn Found will be out of office on 07/09/19

## 2019-06-17 NOTE — Discharge Instructions (Signed)

## 2019-06-18 ENCOUNTER — Encounter: Payer: Self-pay | Admitting: *Deleted

## 2019-06-18 ENCOUNTER — Other Ambulatory Visit: Payer: Self-pay

## 2019-06-18 NOTE — Anesthesia Preprocedure Evaluation (Addendum)
Anesthesia Evaluation  Patient identified by MRN, date of birth, ID band Patient awake    Reviewed: Allergy & Precautions, H&P , NPO status , Patient's Chart, lab work & pertinent test results  Airway Mallampati: II  TM Distance: >3 FB Neck ROM: full    Dental no notable dental hx.    Pulmonary sleep apnea and Continuous Positive Airway Pressure Ventilation ,    Pulmonary exam normal breath sounds clear to auscultation       Cardiovascular hypertension, On Medications + CAD (s/p stents)  Normal cardiovascular exam+ dysrhythmias (PAF on Xarelto) Atrial Fibrillation + Valvular Problems/Murmurs (mild AS) AS  Rhythm:regular Rate:Normal     Neuro/Psych    GI/Hepatic   Endo/Other  diabetes, Type 2  Renal/GU Renal disease (stage III CKD)     Musculoskeletal   Abdominal   Peds  Hematology negative hematology ROS (+)   Anesthesia Other Findings Cardiology note 05/28/18:  ASSESSMENT AND PLAN:   PAF successful cardioversion  03/07/12 to SR/SB History of paroxysmal atrial fib in the past maintaining sinus rhythm on Xarelto  CAD, prior CFX/RCA DES Dec 2011, cath 03/04/12  patent stents- stable CAD History of CAD status post stenting of the circumflex and RCA by myself in the past with last cath 03/02/2012 revealing patent stents 40 to 50% mid AV groove circumflex stenosis and proximal LAD stenosis and normal LV function.  He does have mild effort angina when walking back up a hill does not change in frequency or severity.  Dyslipidemia She of dyslipidemia on statin therapy recent lipid profile performed by his PCP 02/19/2018 revealing total cholesterol 157 LDL 72 HDL of 37 and triglyceride level of 42 which is significantly improved from prior readings.  HTN (hypertension) History of essential hypertension her blood pressure measured today at 158/86.  He is on carvedilol, losartan and hydralazine.  Continue current meds at  current dosing.  Sleep apnea, on C-Pap History of obstructive sleep apnea on CPAP which she benefits from  Aortic stenosis, mild History of mild aortic stenosis 2D echo performed 05/20/2018 with a peak gradient of 22 mmHg and a valve area 9 cm.  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 which we will continue to followed by duplex ultrasound  Lorretta Harp MD [facility], [facility] 05/28/2018  Reproductive/Obstetrics                            Anesthesia Physical Anesthesia Plan  ASA: III  Anesthesia Plan: MAC   Post-op Pain Management:    Induction: Intravenous  PONV Risk Score and Plan:   Airway Management Planned:   Additional Equipment:   Intra-op Plan:   Post-operative Plan:   Informed Consent: I have reviewed the patients History and Physical, chart, labs and discussed the procedure including the risks, benefits and alternatives for the proposed anesthesia with the patient or authorized representative who has indicated his/her understanding and acceptance.       Plan Discussed with:   Anesthesia Plan Comments:        Anesthesia Quick Evaluation

## 2019-06-20 ENCOUNTER — Other Ambulatory Visit
Admission: RE | Admit: 2019-06-20 | Discharge: 2019-06-20 | Disposition: A | Discharge status: Home / Self Care | Payer: Medicare Other | Source: Ambulatory Visit | Attending: Ophthalmology | Admitting: Ophthalmology

## 2019-06-20 ENCOUNTER — Other Ambulatory Visit: Payer: Self-pay

## 2019-06-20 DIAGNOSIS — Z01812 Encounter for preprocedural laboratory examination: Secondary | ICD-10-CM | POA: Insufficient documentation

## 2019-06-20 DIAGNOSIS — Z1159 Encounter for screening for other viral diseases: Secondary | ICD-10-CM | POA: Diagnosis not present

## 2019-06-21 LAB — SARS CORONAVIRUS 2 (TAT 6-24 HRS): SARS Coronavirus 2: NEGATIVE

## 2019-06-24 ENCOUNTER — Other Ambulatory Visit: Payer: Self-pay

## 2019-06-24 ENCOUNTER — Ambulatory Visit (HOSPITAL_COMMUNITY): Payer: Medicare Other | Attending: Cardiology

## 2019-06-24 DIAGNOSIS — I350 Nonrheumatic aortic (valve) stenosis: Secondary | ICD-10-CM | POA: Diagnosis present

## 2019-06-25 ENCOUNTER — Encounter
Admission: RE | Disposition: A | Discharge status: Home / Self Care | Payer: Self-pay | Source: Home / Self Care | Attending: Ophthalmology

## 2019-06-25 ENCOUNTER — Ambulatory Visit: Payer: Medicare Other | Admitting: Anesthesiology

## 2019-06-25 ENCOUNTER — Ambulatory Visit
Admission: RE | Admit: 2019-06-25 | Discharge: 2019-06-25 | Disposition: A | Discharge status: Home / Self Care | Payer: Medicare Other | Attending: Ophthalmology | Admitting: Ophthalmology

## 2019-06-25 DIAGNOSIS — I2510 Atherosclerotic heart disease of native coronary artery without angina pectoris: Secondary | ICD-10-CM | POA: Insufficient documentation

## 2019-06-25 DIAGNOSIS — Z7984 Long term (current) use of oral hypoglycemic drugs: Secondary | ICD-10-CM | POA: Insufficient documentation

## 2019-06-25 DIAGNOSIS — Z955 Presence of coronary angioplasty implant and graft: Secondary | ICD-10-CM | POA: Diagnosis not present

## 2019-06-25 DIAGNOSIS — E1122 Type 2 diabetes mellitus with diabetic chronic kidney disease: Secondary | ICD-10-CM | POA: Diagnosis not present

## 2019-06-25 DIAGNOSIS — I480 Paroxysmal atrial fibrillation: Secondary | ICD-10-CM | POA: Diagnosis not present

## 2019-06-25 DIAGNOSIS — N183 Chronic kidney disease, stage 3 (moderate): Secondary | ICD-10-CM | POA: Diagnosis not present

## 2019-06-25 DIAGNOSIS — Z7901 Long term (current) use of anticoagulants: Secondary | ICD-10-CM | POA: Diagnosis not present

## 2019-06-25 DIAGNOSIS — I129 Hypertensive chronic kidney disease with stage 1 through stage 4 chronic kidney disease, or unspecified chronic kidney disease: Secondary | ICD-10-CM | POA: Insufficient documentation

## 2019-06-25 DIAGNOSIS — Z79899 Other long term (current) drug therapy: Secondary | ICD-10-CM | POA: Diagnosis not present

## 2019-06-25 DIAGNOSIS — H2511 Age-related nuclear cataract, right eye: Secondary | ICD-10-CM | POA: Insufficient documentation

## 2019-06-25 DIAGNOSIS — E785 Hyperlipidemia, unspecified: Secondary | ICD-10-CM | POA: Diagnosis not present

## 2019-06-25 DIAGNOSIS — G4730 Sleep apnea, unspecified: Secondary | ICD-10-CM | POA: Diagnosis not present

## 2019-06-25 DIAGNOSIS — E1136 Type 2 diabetes mellitus with diabetic cataract: Secondary | ICD-10-CM | POA: Insufficient documentation

## 2019-06-25 HISTORY — PX: CATARACT EXTRACTION W/PHACO: SHX586

## 2019-06-25 HISTORY — DX: Nonrheumatic aortic (valve) stenosis: I35.0

## 2019-06-25 HISTORY — DX: Headache, unspecified: R51.9

## 2019-06-25 LAB — GLUCOSE, CAPILLARY
Glucose-Capillary: 125 mg/dL — ABNORMAL HIGH (ref 70–99)
Glucose-Capillary: 131 mg/dL — ABNORMAL HIGH (ref 70–99)

## 2019-06-25 SURGERY — PHACOEMULSIFICATION, CATARACT, WITH IOL INSERTION
Anesthesia: Monitor Anesthesia Care | Site: Eye | Laterality: Right

## 2019-06-25 MED ORDER — BRIMONIDINE TARTRATE-TIMOLOL 0.2-0.5 % OP SOLN
OPHTHALMIC | Status: DC | PRN
  Administered 2019-06-25: 1 [drp] via OPHTHALMIC

## 2019-06-25 MED ORDER — FENTANYL CITRATE (PF) 100 MCG/2ML IJ SOLN
INTRAMUSCULAR | Status: DC | PRN
  Administered 2019-06-25 (×2): 50 ug via INTRAVENOUS

## 2019-06-25 MED ORDER — NA HYALUR & NA CHOND-NA HYALUR 0.4-0.35 ML IO KIT
PACK | INTRAOCULAR | Status: DC | PRN
  Administered 2019-06-25: 1 mL via INTRAOCULAR

## 2019-06-25 MED ORDER — MIDAZOLAM HCL 2 MG/2ML IJ SOLN
INTRAMUSCULAR | Status: DC | PRN
  Administered 2019-06-25: 2 mg via INTRAVENOUS

## 2019-06-25 MED ORDER — CEFUROXIME OPHTHALMIC INJECTION 1 MG/0.1 ML
INJECTION | OPHTHALMIC | Status: DC | PRN
  Administered 2019-06-25: 0.1 mL via INTRACAMERAL

## 2019-06-25 MED ORDER — ARMC OPHTHALMIC DILATING DROPS
1.0000 "application " | OPHTHALMIC | Status: DC | PRN
  Administered 2019-06-25 (×3): 1 via OPHTHALMIC

## 2019-06-25 MED ORDER — TETRACAINE HCL 0.5 % OP SOLN
1.0000 [drp] | OPHTHALMIC | Status: DC | PRN
  Administered 2019-06-25 (×3): 1 [drp] via OPHTHALMIC

## 2019-06-25 MED ORDER — MOXIFLOXACIN HCL 0.5 % OP SOLN
1.0000 [drp] | OPHTHALMIC | Status: DC | PRN
  Administered 2019-06-25 (×3): 1 [drp] via OPHTHALMIC

## 2019-06-25 MED ORDER — LIDOCAINE HCL (PF) 2 % IJ SOLN
INTRAOCULAR | Status: DC | PRN
  Administered 2019-06-25: 2 mL

## 2019-06-25 MED ORDER — EPINEPHRINE PF 1 MG/ML IJ SOLN
INTRAOCULAR | Status: DC | PRN
  Administered 2019-06-25: 97 mL via OPHTHALMIC

## 2019-06-25 SURGICAL SUPPLY — 20 items
CANNULA ANT/CHMB 27G (MISCELLANEOUS) ×1 IMPLANT
CANNULA ANT/CHMB 27GA (MISCELLANEOUS) ×3 IMPLANT
GLOVE SURG LX 7.5 STRW (GLOVE) ×2
GLOVE SURG LX STRL 7.5 STRW (GLOVE) ×1 IMPLANT
GLOVE SURG TRIUMPH 8.0 PF LTX (GLOVE) ×3 IMPLANT
GOWN STRL REUS W/ TWL LRG LVL3 (GOWN DISPOSABLE) ×2 IMPLANT
GOWN STRL REUS W/TWL LRG LVL3 (GOWN DISPOSABLE) ×6
LENS IOL TECNIS ITEC 21.0 (Intraocular Lens) ×2 IMPLANT
MARKER SKIN DUAL TIP RULER LAB (MISCELLANEOUS) ×3 IMPLANT
NDL FILTER BLUNT 18X1 1/2 (NEEDLE) ×1 IMPLANT
NEEDLE FILTER BLUNT 18X 1/2SAF (NEEDLE) ×2
NEEDLE FILTER BLUNT 18X1 1/2 (NEEDLE) ×1 IMPLANT
PACK CATARACT BRASINGTON (MISCELLANEOUS) ×3 IMPLANT
PACK EYE AFTER SURG (MISCELLANEOUS) ×3 IMPLANT
PACK OPTHALMIC (MISCELLANEOUS) ×3 IMPLANT
SYR 3ML LL SCALE MARK (SYRINGE) ×3 IMPLANT
SYR 5ML LL (SYRINGE) ×3 IMPLANT
SYR TB 1ML LUER SLIP (SYRINGE) ×3 IMPLANT
WATER STERILE IRR 500ML POUR (IV SOLUTION) ×3 IMPLANT
WIPE NON LINTING 3.25X3.25 (MISCELLANEOUS) ×3 IMPLANT

## 2019-06-25 NOTE — Transfer of Care (Signed)
Immediate Anesthesia Transfer of Care Note  Patient: Jason Neal  Procedure(s) Performed: CATARACT EXTRACTION PHACO AND INTRAOCULAR LENS PLACEMENT (IOC) RIGHT (Right Eye)  Patient Location: PACU  Anesthesia Type: MAC  Level of Consciousness: awake, alert  and patient cooperative  Airway and Oxygen Therapy: Patient Spontanous Breathing and Patient connected to supplemental oxygen  Post-op Assessment: Post-op Vital signs reviewed, Patient's Cardiovascular Status Stable, Respiratory Function Stable, Patent Airway and No signs of Nausea or vomiting  Post-op Vital Signs: Reviewed and stable  Complications: No apparent anesthesia complications

## 2019-06-25 NOTE — Op Note (Signed)
LOCATION:  [place]   PREOPERATIVE DIAGNOSIS:    Nuclear sclerotic cataract right eye. H25.11   POSTOPERATIVE DIAGNOSIS:  Nuclear sclerotic cataract right eye.     PROCEDURE:  Phacoemusification with posterior chamber intraocular lens placement of the right eye   LENS:   Implant Name Type Inv. Item Serial No. Manufacturer Lot No. LRB No. Used Action  LENS IOL DIOP 21.0 - [id number] Intraocular Lens LENS IOL DIOP 21.0 [id number] AMO  Right 1 Implanted        ULTRASOUND TIME: 13 % of 1 minutes, 18 seconds.  CDE 10.6   SURGEON:  Wyonia Hough, MD   ANESTHESIA:  Topical with tetracaine drops and 2% Xylocaine jelly, augmented with 1% preservative-free intracameral lidocaine.    COMPLICATIONS:  None.   DESCRIPTION OF PROCEDURE:  The patient was identified in the holding room and transported to the operating room and placed in the supine position under the operating microscope.  The right eye was identified as the operative eye and it was prepped and draped in the usual sterile ophthalmic fashion.   A 1 millimeter clear-corneal paracentesis was made at the 12:00 position.  0.5 ml of preservative-free 1% lidocaine was injected into the anterior chamber. The anterior chamber was filled with Viscoat viscoelastic.  A 2.4 millimeter keratome was used to make a near-clear corneal incision at the 9:00 position.  A curvilinear capsulorrhexis was made with a cystotome and capsulorrhexis forceps.  Balanced salt solution was used to hydrodissect and hydrodelineate the nucleus.   Phacoemulsification was then used in stop and chop fashion to remove the lens nucleus and epinucleus.  The remaining cortex was then removed using the irrigation and aspiration handpiece. Provisc was then placed into the capsular bag to distend it for lens placement.  A lens was then injected into the capsular bag.  The remaining viscoelastic was aspirated.   Wounds were hydrated with balanced salt solution.   The anterior chamber was inflated to a physiologic pressure with balanced salt solution.  No wound leaks were noted. Cefuroxime 0.1 ml of a 10mg /ml solution was injected into the anterior chamber for a dose of 1 mg of intracameral antibiotic at the completion of the case.   Timolol and Brimonidine drops were applied to the eye.  The patient was taken to the recovery room in stable condition without complications of anesthesia or surgery.   Denelda Akerley 06/25/2019, 10:39 AM

## 2019-06-25 NOTE — Anesthesia Postprocedure Evaluation (Signed)
Anesthesia Post Note  Patient: Jason Neal  Procedure(s) Performed: CATARACT EXTRACTION PHACO AND INTRAOCULAR LENS PLACEMENT (IOC) RIGHT (Right Eye)  Patient location during evaluation: PACU Anesthesia Type: MAC Level of consciousness: awake and alert Pain management: pain level controlled Vital Signs Assessment: post-procedure vital signs reviewed and stable Respiratory status: spontaneous breathing Cardiovascular status: stable Anesthetic complications: no    Jaci Standard, III,  Dalanie Kisner D

## 2019-06-25 NOTE — H&P (Signed)
.    The History and Physical notes are on paper, have been signed, and are to be scanned. The patient remains stable and unchanged from the H&P.   Previous H&P reviewed, patient examined, and there are no changes.  The patient has a visually significant cataract interfering with his or her vision.  I attest that the following are true and accurate to the best of my knowledge: 1. The patient's impairment of visual function is believed not to be correctable with a tolerable change in glasses or contact lenses. 2. Cataract (in the operative eye) is believed to be significantly contributing to the patient's visual impairment. 3. The patient desires surgical correction; the risks, benefits, and alternatives have been explained, and questions have been answered to the patients satisfaction.  A reasonable expectation exists that cataract surgery will significantly improve both the visual and functional status of the patient.  Jason Neal 06/25/2019 8:07 AM

## 2019-06-26 ENCOUNTER — Encounter: Payer: Self-pay | Admitting: Ophthalmology

## 2019-07-02 ENCOUNTER — Telehealth: Payer: Medicare Other | Admitting: Cardiovascular Disease

## 2019-07-02 ENCOUNTER — Other Ambulatory Visit: Payer: Self-pay

## 2019-07-02 ENCOUNTER — Telehealth: Payer: Self-pay

## 2019-07-02 ENCOUNTER — Encounter: Payer: Self-pay | Admitting: Cardiovascular Disease

## 2019-07-02 ENCOUNTER — Ambulatory Visit: Payer: Medicare Other | Admitting: Cardiovascular Disease

## 2019-07-02 ENCOUNTER — Ambulatory Visit (INDEPENDENT_AMBULATORY_CARE_PROVIDER_SITE_OTHER): Payer: Medicare Other | Admitting: Cardiovascular Disease

## 2019-07-02 DIAGNOSIS — I10 Essential (primary) hypertension: Secondary | ICD-10-CM | POA: Diagnosis not present

## 2019-07-02 DIAGNOSIS — I480 Paroxysmal atrial fibrillation: Secondary | ICD-10-CM | POA: Diagnosis not present

## 2019-07-02 DIAGNOSIS — I2510 Atherosclerotic heart disease of native coronary artery without angina pectoris: Secondary | ICD-10-CM | POA: Diagnosis not present

## 2019-07-02 DIAGNOSIS — E785 Hyperlipidemia, unspecified: Secondary | ICD-10-CM

## 2019-07-02 DIAGNOSIS — I350 Nonrheumatic aortic (valve) stenosis: Secondary | ICD-10-CM

## 2019-07-02 DIAGNOSIS — I712 Thoracic aortic aneurysm, without rupture, unspecified: Secondary | ICD-10-CM

## 2019-07-02 DIAGNOSIS — G4733 Obstructive sleep apnea (adult) (pediatric): Secondary | ICD-10-CM

## 2019-07-02 NOTE — Telephone Encounter (Signed)
This encounter was created in error - please disregard.

## 2019-07-02 NOTE — Assessment & Plan Note (Signed)
History of dyslipidemia on omega-3 fish oil and atorvastatin with lipid profile performed by his PCP 05/28/2019 revealing total cluster 143, LDL 46 and HDL 36 with a triglyceride level of 304.

## 2019-07-02 NOTE — Assessment & Plan Note (Signed)
History of essential hypertension with blood pressure measured today 136/78.  He is on diltiazem, hydralazine losartan.

## 2019-07-02 NOTE — Assessment & Plan Note (Signed)
History of breast aortic aneurysm measuring 46 mm by recent 2D echo.  We will follow this on annual basis when it approaches 50 mm I will get a CTA to further characterize
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
## 2019-07-02 NOTE — Assessment & Plan Note (Signed)
History of mild aortic stenosis which we have been following by 2D echo most recently performed 06/24/2019.  We will repeat this in 12 months

## 2019-07-02 NOTE — Telephone Encounter (Signed)
    COVID-19 Pre-Screening Questions:  . In the past 7 to 10 days have you had a cough,  shortness of breath, headache, congestion, fever (100 or greater) body aches, chills, sore throat, or sudden loss of taste or sense of smell? NO . Have you been around anyone with known Covid 19. NO . Have you been around anyone who is awaiting Covid 19 test results in the past 7 to 10 days? NO; HAD TEST HIMSELF 2 WEEKS AGO AND WAS NEGATIVE  . Have you been around anyone who has been exposed to Covid 19, or has mentioned symptoms of Covid 19 within the past 7 to 10 days? NO  If you have any concerns/questions about symptoms patients report during screening (either on the phone or at threshold). Contact the provider seeing the patient or DOD for further guidance.  If neither are available contact a member of the leadership team.       Pt aware to wear a mask and of guest policy and verbalized understanding

## 2019-07-02 NOTE — Assessment & Plan Note (Signed)
History of PAF rhythm status post successful cardioversion 2013 on Xarelto oral anticoagulation.

## 2019-07-02 NOTE — Assessment & Plan Note (Signed)
History of obstructive sleep apnea on CPAP. 

## 2019-07-02 NOTE — Assessment & Plan Note (Signed)
History of CAD status post RCA and circumflex intervention in [place] in 2011.  Cath 3/13 revealed patent stents with 40 to 50% mid AV groove circumflex and proximal LAD stenosis and normal LV function.  He denies chest pain or shortness of breath.

## 2019-07-02 NOTE — Patient Instructions (Addendum)
Medication Instructions:  Your physician recommends that you continue on your current medications as directed. Please refer to the Current Medication list given to you today.  If you need a refill on your cardiac medications before your next appointment, please call your pharmacy.   Lab work: NONE If you have labs (blood work) drawn today and your tests are completely normal, you will receive your results only by: [name] MyChart Message (if you have MyChart) OR . A paper copy in the mail If you have any lab test that is abnormal or we need to change your treatment, we will call you to review the results.  Testing/Procedures: Your physician has requested that you have an echocardiogram. Echocardiography is a painless test that uses sound waves to create images of your heart. It provides your doctor with information about the size and shape of your heart and how well your heart's chambers and valves are working. This procedure takes approximately one hour. There are no restrictions for this procedure. LOCATION: [facility]: [address], [place], [place] [address]  TO BE SCHEDULED FOR 12 MONTHS   Follow-Up: At [facility], you and your health needs are our priority.  As part of our continuing mission to provide you with exceptional heart care, we have created designated Provider Care Teams.  These Care Teams include your primary Cardiologist (physician) and Advanced Practice Providers (APPs -  Physician Assistants and Nurse Practitioners) who all work together to provide you with the care you need, when you need it. You will need a follow up appointment in 12 months WITH DR. Gwenlyn Found.  Please call our office 2 months in advance to schedule this appointment. PLEASE HAVE YOUR ECHOCARDIOGRAM COMPLETED BEFORE YOUR NEXT APPOINTMENT WITH DR. Gwenlyn Found

## 2019-07-09 ENCOUNTER — Telehealth: Payer: Medicare Other | Admitting: Cardiovascular Disease

## 2019-07-15 ENCOUNTER — Encounter: Payer: Self-pay | Admitting: *Deleted

## 2019-07-15 ENCOUNTER — Other Ambulatory Visit: Payer: Self-pay

## 2019-07-16 NOTE — Discharge Instructions (Signed)

## 2019-07-18 ENCOUNTER — Other Ambulatory Visit: Payer: Self-pay

## 2019-07-18 ENCOUNTER — Other Ambulatory Visit
Admission: RE | Admit: 2019-07-18 | Discharge: 2019-07-18 | Disposition: A | Discharge status: Home / Self Care | Payer: Medicare Other | Source: Ambulatory Visit | Attending: Ophthalmology | Admitting: Ophthalmology

## 2019-07-18 DIAGNOSIS — Z20828 Contact with and (suspected) exposure to other viral communicable diseases: Secondary | ICD-10-CM | POA: Diagnosis not present

## 2019-07-18 DIAGNOSIS — Z01812 Encounter for preprocedural laboratory examination: Secondary | ICD-10-CM | POA: Diagnosis present

## 2019-07-19 LAB — SARS CORONAVIRUS 2 (TAT 6-24 HRS): SARS Coronavirus 2: NEGATIVE

## 2019-07-23 ENCOUNTER — Ambulatory Visit: Payer: Medicare Other | Admitting: Anesthesiology

## 2019-07-23 ENCOUNTER — Other Ambulatory Visit: Payer: Self-pay

## 2019-07-23 ENCOUNTER — Encounter
Admission: RE | Disposition: A | Discharge status: Home / Self Care | Payer: Self-pay | Source: Home / Self Care | Attending: Ophthalmology

## 2019-07-23 ENCOUNTER — Ambulatory Visit
Admission: RE | Admit: 2019-07-23 | Discharge: 2019-07-23 | Disposition: A | Discharge status: Home / Self Care | Payer: Medicare Other | Attending: Ophthalmology | Admitting: Ophthalmology

## 2019-07-23 DIAGNOSIS — Z79899 Other long term (current) drug therapy: Secondary | ICD-10-CM | POA: Insufficient documentation

## 2019-07-23 DIAGNOSIS — Z7982 Long term (current) use of aspirin: Secondary | ICD-10-CM | POA: Insufficient documentation

## 2019-07-23 DIAGNOSIS — I2510 Atherosclerotic heart disease of native coronary artery without angina pectoris: Secondary | ICD-10-CM | POA: Insufficient documentation

## 2019-07-23 DIAGNOSIS — H9190 Unspecified hearing loss, unspecified ear: Secondary | ICD-10-CM | POA: Insufficient documentation

## 2019-07-23 DIAGNOSIS — G4730 Sleep apnea, unspecified: Secondary | ICD-10-CM | POA: Diagnosis not present

## 2019-07-23 DIAGNOSIS — Z7901 Long term (current) use of anticoagulants: Secondary | ICD-10-CM | POA: Insufficient documentation

## 2019-07-23 DIAGNOSIS — I129 Hypertensive chronic kidney disease with stage 1 through stage 4 chronic kidney disease, or unspecified chronic kidney disease: Secondary | ICD-10-CM | POA: Diagnosis not present

## 2019-07-23 DIAGNOSIS — H2512 Age-related nuclear cataract, left eye: Secondary | ICD-10-CM | POA: Diagnosis not present

## 2019-07-23 DIAGNOSIS — E1122 Type 2 diabetes mellitus with diabetic chronic kidney disease: Secondary | ICD-10-CM | POA: Diagnosis not present

## 2019-07-23 DIAGNOSIS — N183 Chronic kidney disease, stage 3 (moderate): Secondary | ICD-10-CM | POA: Diagnosis not present

## 2019-07-23 DIAGNOSIS — Z7984 Long term (current) use of oral hypoglycemic drugs: Secondary | ICD-10-CM | POA: Diagnosis not present

## 2019-07-23 DIAGNOSIS — E785 Hyperlipidemia, unspecified: Secondary | ICD-10-CM | POA: Diagnosis not present

## 2019-07-23 DIAGNOSIS — E7800 Pure hypercholesterolemia, unspecified: Secondary | ICD-10-CM | POA: Insufficient documentation

## 2019-07-23 DIAGNOSIS — I480 Paroxysmal atrial fibrillation: Secondary | ICD-10-CM | POA: Diagnosis not present

## 2019-07-23 DIAGNOSIS — E1136 Type 2 diabetes mellitus with diabetic cataract: Secondary | ICD-10-CM | POA: Insufficient documentation

## 2019-07-23 DIAGNOSIS — Z85828 Personal history of other malignant neoplasm of skin: Secondary | ICD-10-CM | POA: Diagnosis not present

## 2019-07-23 DIAGNOSIS — Z955 Presence of coronary angioplasty implant and graft: Secondary | ICD-10-CM | POA: Diagnosis not present

## 2019-07-23 DIAGNOSIS — K219 Gastro-esophageal reflux disease without esophagitis: Secondary | ICD-10-CM | POA: Insufficient documentation

## 2019-07-23 HISTORY — PX: CATARACT EXTRACTION W/PHACO: SHX586

## 2019-07-23 LAB — GLUCOSE, CAPILLARY
Glucose-Capillary: 130 mg/dL — ABNORMAL HIGH (ref 70–99)
Glucose-Capillary: 117 mg/dL — ABNORMAL HIGH (ref 70–99)

## 2019-07-23 SURGERY — PHACOEMULSIFICATION, CATARACT, WITH IOL INSERTION
Anesthesia: Monitor Anesthesia Care | Site: Eye | Laterality: Left

## 2019-07-23 MED ORDER — EPINEPHRINE PF 1 MG/ML IJ SOLN
INTRAOCULAR | Status: DC | PRN
  Administered 2019-07-23: 10:00:00 69 mL via OPHTHALMIC

## 2019-07-23 MED ORDER — TETRACAINE HCL 0.5 % OP SOLN
1.0000 [drp] | OPHTHALMIC | Status: DC | PRN
  Administered 2019-07-23 (×3): 1 [drp] via OPHTHALMIC

## 2019-07-23 MED ORDER — BRIMONIDINE TARTRATE-TIMOLOL 0.2-0.5 % OP SOLN
OPHTHALMIC | Status: DC | PRN
  Administered 2019-07-23: 1 [drp] via OPHTHALMIC

## 2019-07-23 MED ORDER — MIDAZOLAM HCL 2 MG/2ML IJ SOLN
INTRAMUSCULAR | Status: DC | PRN
  Administered 2019-07-23 (×2): 1 mg via INTRAVENOUS

## 2019-07-23 MED ORDER — ARMC OPHTHALMIC DILATING DROPS
1.0000 "application " | OPHTHALMIC | Status: DC | PRN
  Administered 2019-07-23 (×3): 1 via OPHTHALMIC

## 2019-07-23 MED ORDER — ACETAMINOPHEN 325 MG PO TABS: 325.0000 mg | ORAL_TABLET | Freq: Once | ORAL | Status: DC

## 2019-07-23 MED ORDER — NA HYALUR & NA CHOND-NA HYALUR 0.4-0.35 ML IO KIT
PACK | INTRAOCULAR | Status: DC | PRN
  Administered 2019-07-23: 1 mL via INTRAOCULAR

## 2019-07-23 MED ORDER — LIDOCAINE HCL (PF) 2 % IJ SOLN
INTRAOCULAR | Status: DC | PRN
  Administered 2019-07-23: 1 mL

## 2019-07-23 MED ORDER — ACETAMINOPHEN 160 MG/5ML PO SOLN: 325.0000 mg | Freq: Once | ORAL | Status: DC

## 2019-07-23 MED ORDER — MOXIFLOXACIN HCL 0.5 % OP SOLN
1.0000 [drp] | OPHTHALMIC | Status: DC | PRN
  Administered 2019-07-23 (×3): 1 [drp] via OPHTHALMIC

## 2019-07-23 MED ORDER — LACTATED RINGERS IV SOLN: INTRAVENOUS | Status: DC

## 2019-07-23 MED ORDER — CEFUROXIME OPHTHALMIC INJECTION 1 MG/0.1 ML
INJECTION | OPHTHALMIC | Status: DC | PRN
  Administered 2019-07-23: 0.1 mL via INTRACAMERAL

## 2019-07-23 MED ORDER — FENTANYL CITRATE (PF) 100 MCG/2ML IJ SOLN
INTRAMUSCULAR | Status: DC | PRN
  Administered 2019-07-23 (×2): 50 ug via INTRAVENOUS

## 2019-07-23 SURGICAL SUPPLY — 16 items
CANNULA ANT/CHMB 27G (MISCELLANEOUS) ×1 IMPLANT
CANNULA ANT/CHMB 27GA (MISCELLANEOUS) ×3 IMPLANT
GLOVE SURG LX 7.5 STRW (GLOVE) ×2
GLOVE SURG LX STRL 7.5 STRW (GLOVE) ×1 IMPLANT
GLOVE SURG TRIUMPH 8.0 PF LTX (GLOVE) ×3 IMPLANT
GOWN STRL REUS W/ TWL LRG LVL3 (GOWN DISPOSABLE) ×2 IMPLANT
GOWN STRL REUS W/TWL LRG LVL3 (GOWN DISPOSABLE) ×6
LENS IOL TECNIS ITEC 20.0 (Intraocular Lens) ×2 IMPLANT
MARKER SKIN DUAL TIP RULER LAB (MISCELLANEOUS) ×3 IMPLANT
PACK CATARACT BRASINGTON (MISCELLANEOUS) ×3 IMPLANT
PACK EYE AFTER SURG (MISCELLANEOUS) ×3 IMPLANT
PACK OPTHALMIC (MISCELLANEOUS) ×3 IMPLANT
SYR 3ML LL SCALE MARK (SYRINGE) ×3 IMPLANT
SYR TB 1ML LUER SLIP (SYRINGE) ×3 IMPLANT
WATER STERILE IRR 500ML POUR (IV SOLUTION) ×3 IMPLANT
WIPE NON LINTING 3.25X3.25 (MISCELLANEOUS) ×3 IMPLANT

## 2019-07-23 NOTE — Anesthesia Procedure Notes (Signed)
Procedure Name: MAC Date/Time: 07/23/2019 9:20 AM Performed by: Georga Bora, CRNA Pre-anesthesia Checklist: Patient identified, Emergency Drugs available, Suction available, Patient being monitored and Timeout performed Oxygen Delivery Method: Nasal cannula

## 2019-07-23 NOTE — Anesthesia Preprocedure Evaluation (Signed)
Anesthesia Evaluation  Patient identified by MRN, date of birth, ID band Patient awake    Reviewed: Allergy & Precautions, H&P , NPO status , Patient's Chart, lab work & pertinent test results  Airway Mallampati: II  TM Distance: >3 FB Neck ROM: full    Dental no notable dental hx.    Pulmonary sleep apnea and Continuous Positive Airway Pressure Ventilation ,    Pulmonary exam normal breath sounds clear to auscultation       Cardiovascular hypertension, On Medications + CAD (s/p stents)  Normal cardiovascular exam+ dysrhythmias (PAF on Xarelto) + Valvular Problems/Murmurs (mild AS) AS  Rhythm:regular Rate:Normal     Neuro/Psych    GI/Hepatic   Endo/Other  diabetes, Type 2  Renal/GU Renal disease (stage III CKD)     Musculoskeletal   Abdominal   Peds  Hematology negative hematology ROS (+)   Anesthesia Other Findings Cardiology note 05/28/18:  ASSESSMENT AND PLAN:   PAF successful cardioversion  03/07/12 to SR/SB History of paroxysmal atrial fib in the past maintaining sinus rhythm on Xarelto  CAD, prior CFX/RCA DES Dec 2011, cath 03/04/12  patent stents- stable CAD History of CAD status post stenting of the circumflex and RCA by myself in the past with last cath 03/02/2012 revealing patent stents 40 to 50% mid AV groove circumflex stenosis and proximal LAD stenosis and normal LV function.  He does have mild effort angina when walking back up a hill does not change in frequency or severity.  Dyslipidemia She of dyslipidemia on statin therapy recent lipid profile performed by his PCP 02/19/2018 revealing total cholesterol 157 LDL 72 HDL of 37 and triglyceride level of 42 which is significantly improved from prior readings.  HTN (hypertension) History of essential hypertension her blood pressure measured today at 158/86.  He is on carvedilol, losartan and hydralazine.  Continue current meds at current  dosing.  Sleep apnea, on C-Pap History of obstructive sleep apnea on CPAP which she benefits from  Aortic stenosis, mild History of mild aortic stenosis 2D echo performed 05/20/2018 with a peak gradient of 22 mmHg and a valve area 9 cm.  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 which we will continue to followed by duplex ultrasound  Lorretta Harp MD [facility], [facility] 05/28/2018  Reproductive/Obstetrics                             Anesthesia Physical  Anesthesia Plan  ASA: III  Anesthesia Plan: MAC   Post-op Pain Management:    Induction: Intravenous  PONV Risk Score and Plan: 1 and Midazolam, Treatment may vary due to age or medical condition and TIVA  Airway Management Planned:   Additional Equipment:   Intra-op Plan:   Post-operative Plan:   Informed Consent: I have reviewed the patients History and Physical, chart, labs and discussed the procedure including the risks, benefits and alternatives for the proposed anesthesia with the patient or authorized representative who has indicated his/her understanding and acceptance.       Plan Discussed with: CRNA  Anesthesia Plan Comments:         Anesthesia Quick Evaluation

## 2019-07-23 NOTE — Transfer of Care (Signed)
Immediate Anesthesia Transfer of Care Note  Patient: Jason Neal  Procedure(s) Performed: CATARACT EXTRACTION PHACO AND INTRAOCULAR LENS PLACEMENT (IOC) LEFT DIABETIC (Left Eye)  Patient Location: PACU  Anesthesia Type: MAC  Level of Consciousness: awake, alert  and patient cooperative  Airway and Oxygen Therapy: Patient Spontanous Breathing and Patient connected to supplemental oxygen  Post-op Assessment: Post-op Vital signs reviewed, Patient's Cardiovascular Status Stable, Respiratory Function Stable, Patent Airway and No signs of Nausea or vomiting  Post-op Vital Signs: Reviewed and stable  Complications: No apparent anesthesia complications

## 2019-07-23 NOTE — Op Note (Signed)
OPERATIVE NOTE  Jason Neal [id number] 07/23/2019   PREOPERATIVE DIAGNOSIS:  Nuclear sclerotic cataract left eye. H25.12   POSTOPERATIVE DIAGNOSIS:    Nuclear sclerotic cataract left eye.     PROCEDURE:  Phacoemusification with posterior chamber intraocular lens placement of the left eye   LENS:   Implant Name Type Inv. Item Serial No. Manufacturer Lot No. LRB No. Used Action  LENS IOL DIOP 20.0 - [id number] Intraocular Lens LENS IOL DIOP 20.0 [id number]  Left 1 Implanted        ULTRASOUND TIME: 11  % of 0 minutes 49 seconds, CDE 5.2  SURGEON:  Wyonia Hough, MD   ANESTHESIA:  Topical with tetracaine drops and 2% Xylocaine jelly, augmented with 1% preservative-free intracameral lidocaine.    COMPLICATIONS:  None.   DESCRIPTION OF PROCEDURE:  The patient was identified in the holding room and transported to the operating room and placed in the supine position under the operating microscope.  The left eye was identified as the operative eye and it was prepped and draped in the usual sterile ophthalmic fashion.   A 1 millimeter clear-corneal paracentesis was made at the 1:30 position.  0.5 ml of preservative-free 1% lidocaine was injected into the anterior chamber.  The anterior chamber was filled with Viscoat viscoelastic.  A 2.4 millimeter keratome was used to make a near-clear corneal incision at the 10:30 position.  .  A curvilinear capsulorrhexis was made with a cystotome and capsulorrhexis forceps.  Balanced salt solution was used to hydrodissect and hydrodelineate the nucleus.   Phacoemulsification was then used in stop and chop fashion to remove the lens nucleus and epinucleus.  The remaining cortex was then removed using the irrigation and aspiration handpiece. Provisc was then placed into the capsular bag to distend it for lens placement.  A lens was then injected into the capsular bag.  The remaining viscoelastic was aspirated.   Wounds were hydrated with  balanced salt solution.  The anterior chamber was inflated to a physiologic pressure with balanced salt solution.  No wound leaks were noted. Cefuroxime 0.1 ml of a 10mg /ml solution was injected into the anterior chamber for a dose of 1 mg of intracameral antibiotic at the completion of the case.   Timolol and Brimonidine drops were applied to the eye.  The patient was taken to the recovery room in stable condition without complications of anesthesia or surgery.  [name] 07/23/2019, 9:37 AM

## 2019-07-23 NOTE — H&P (Signed)

## 2019-07-23 NOTE — Anesthesia Postprocedure Evaluation (Signed)
Anesthesia Post Note  Patient: Jason Neal  Procedure(s) Performed: CATARACT EXTRACTION PHACO AND INTRAOCULAR LENS PLACEMENT (IOC) LEFT DIABETIC (Left Eye)  Patient location during evaluation: PACU Anesthesia Type: MAC Level of consciousness: awake and alert and oriented Pain management: satisfactory to patient Vital Signs Assessment: post-procedure vital signs reviewed and stable Respiratory status: spontaneous breathing, nonlabored ventilation and respiratory function stable Cardiovascular status: blood pressure returned to baseline and stable Postop Assessment: Adequate PO intake and No signs of nausea or vomiting Anesthetic complications: no    Raliegh Ip

## 2019-07-24 ENCOUNTER — Encounter: Payer: Self-pay | Admitting: Ophthalmology

## 2019-11-26 ENCOUNTER — Other Ambulatory Visit: Payer: Self-pay | Admitting: General Surgery

## 2019-12-10 ENCOUNTER — Other Ambulatory Visit: Payer: Self-pay

## 2019-12-10 ENCOUNTER — Encounter
Admission: RE | Admit: 2019-12-10 | Discharge: 2019-12-10 | Disposition: A | Discharge status: Home / Self Care | Payer: Medicare Other | Source: Ambulatory Visit | Attending: General Surgery | Admitting: General Surgery

## 2019-12-10 DIAGNOSIS — Z01818 Encounter for other preprocedural examination: Secondary | ICD-10-CM | POA: Insufficient documentation

## 2019-12-10 HISTORY — DX: Allergy, unspecified, initial encounter: T78.40XA

## 2019-12-10 HISTORY — DX: Personal history of urinary calculi: Z87.442

## 2019-12-10 HISTORY — DX: Cardiac murmur, unspecified: R01.1
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
## 2019-12-10 NOTE — Patient Instructions (Addendum)
Your procedure is scheduled on: 12-16-18 [facility] Report to Same Day Surgery 2nd floor medical mall [facility] Entrance-take elevator on left to 2nd floor.  Check in with surgery information desk.) To find out your arrival time please call [phone] between 1PM - 3PM on 12-15-18 TUESDAY  Remember: Instructions that are not followed completely may result in serious medical risk, up to and including death, or upon the discretion of your surgeon and anesthesiologist your surgery may need to be rescheduled.    _x___ 1. Do not eat food after midnight the night before your procedure. NO GUM OR CANDY AFTER MIDNIGHT. You may drink WATER up to 2 hours before you are scheduled to arrive at the hospital for your procedure.  Do not drink WATER within 2 hours of your scheduled arrival to the hospital.  Type 1 and type 2 diabetics should only drink water.   ____Ensure clear carbohydrate drink on the way to the hospital for bariatric patients  ____Ensure clear carbohydrate drink 3 hours before surgery.     __x__ 2. No Alcohol for 24 hours before or after surgery.   __x__3. No Smoking or e-cigarettes for 24 prior to surgery.  Do not use any chewable tobacco products for at least 6 hour prior to surgery   ____  4. Bring all medications with you on the day of surgery if instructed.    __x__ 5. Notify your doctor if there is any change in your medical condition     (cold, fever, infections).    x___6. On the morning of surgery brush your teeth with toothpaste and water.  You may rinse your mouth with mouth wash if you wish.  Do not swallow any toothpaste or mouthwash.   Do not wear jewelry, make-up, hairpins, clips or nail polish.  Do not wear lotions, powders, or perfumes.   Do not shave 48 hours prior to surgery. Men may shave face and neck.  Do not bring valuables to the hospital.    [facility] is not responsible for any belongings or valuables.               Contacts, dentures or bridgework  may not be worn into surgery.  Leave your suitcase in the car. After surgery it may be brought to your room.  For patients admitted to the hospital, discharge time is determined by your treatment team.  _  Patients discharged the day of surgery will not be allowed to drive home.  You will need someone to drive you home and stay with you the night of your procedure.    Please read over the following fact sheets that you were given:   [facility] Preparing for Surgery   _x___ TAKE THE FOLLOWING MEDICATION THE MORNING OF SURGERY WITH A SMALL SIP OF WATER. These include:  1. DILTIAZEM (CARTIA XT)  2. COREG (CARVEDILOL)  3. HYDRALAZINE (APRESOLINE)  4. PROTONIX (PANTOPRAZOLE)  5.   6.  ____Fleets enema or Magnesium Citrate as directed.   _x___ Use CHG Soap or sage wipes as directed on instruction sheet   _X___  Bring ALBUTEROL INHALER to hospital day of surgery  _X___ Stop Metformin-Glipizide (Metaglip) 2 days prior to surgery-LAST DOSE ON Sunday 12-14-19   ____ Take 1/2 of usual insulin dose the night before surgery and none on the morning surgery.   _x___ Follow recommendations from Cardiologist, Pulmonologist or PCP regarding stopping Aspirin, Coumadin, Plavix ,Eliquis, Effient, or Pradaxa, and Pletal-CALL DR Dwyane Luo OFFICE REGARDING XARELTO (HIS NOTE  SAYS HOLD 48 HOURS PRIOR BUT OFFICE STAFF TOLD HIM TO HOLD 72 HOURS PRIOR-PT WILL CALL TO CLARIFY THIS) CONTINUE 83 MG ASPIRIN-DO NOT TAKE AM OF SURGERY  X____Stop Anti-inflammatories such as Advil, Aleve, Ibuprofen, Motrin, Naproxen, Naprosyn, Goodies powders or aspirin products NOW-OK to take Tylenol    _x___ Stop supplements until after surgery-STOP [place]   _X___ Bring C-Pap to the hospital.

## 2019-12-15 ENCOUNTER — Other Ambulatory Visit: Payer: Self-pay

## 2019-12-15 ENCOUNTER — Other Ambulatory Visit
Admission: RE | Admit: 2019-12-15 | Discharge: 2019-12-15 | Disposition: A | Discharge status: Home / Self Care | Payer: Medicare Other | Source: Ambulatory Visit | Attending: General Surgery | Admitting: General Surgery

## 2019-12-15 DIAGNOSIS — Z20822 Contact with and (suspected) exposure to covid-19: Secondary | ICD-10-CM | POA: Diagnosis not present

## 2019-12-15 DIAGNOSIS — Z01812 Encounter for preprocedural laboratory examination: Secondary | ICD-10-CM | POA: Insufficient documentation

## 2019-12-15 LAB — CBC
HCT: 40.4 % (ref 39.0–52.0)
Hemoglobin: 14.2 g/dL (ref 13.0–17.0)
MCH: 27.8 pg (ref 26.0–34.0)
MCHC: 35.1 g/dL (ref 30.0–36.0)
MCV: 79.2 fL — ABNORMAL LOW (ref 80.0–100.0)
Platelets: 206 10*3/uL (ref 150–400)
RBC: 5.1 MIL/uL (ref 4.22–5.81)
RDW: 13.7 % (ref 11.5–15.5)
WBC: 7.4 10*3/uL (ref 4.0–10.5)
nRBC: 0 % (ref 0.0–0.2)

## 2019-12-15 LAB — SARS CORONAVIRUS 2 (TAT 6-24 HRS): SARS Coronavirus 2: NEGATIVE

## 2019-12-17 ENCOUNTER — Ambulatory Visit: Payer: Medicare Other | Admitting: Anesthesiology

## 2019-12-17 ENCOUNTER — Ambulatory Visit
Admission: RE | Admit: 2019-12-17 | Discharge: 2019-12-17 | Disposition: A | Discharge status: Home / Self Care | Payer: Medicare Other | Attending: General Surgery | Admitting: General Surgery

## 2019-12-17 ENCOUNTER — Encounter
Admission: RE | Disposition: A | Discharge status: Home / Self Care | Payer: Self-pay | Source: Home / Self Care | Attending: General Surgery

## 2019-12-17 ENCOUNTER — Encounter: Payer: Self-pay | Admitting: General Surgery

## 2019-12-17 ENCOUNTER — Other Ambulatory Visit: Payer: Self-pay

## 2019-12-17 DIAGNOSIS — Z85820 Personal history of malignant melanoma of skin: Secondary | ICD-10-CM | POA: Insufficient documentation

## 2019-12-17 DIAGNOSIS — G4730 Sleep apnea, unspecified: Secondary | ICD-10-CM | POA: Insufficient documentation

## 2019-12-17 DIAGNOSIS — Z7982 Long term (current) use of aspirin: Secondary | ICD-10-CM | POA: Insufficient documentation

## 2019-12-17 DIAGNOSIS — E1122 Type 2 diabetes mellitus with diabetic chronic kidney disease: Secondary | ICD-10-CM | POA: Diagnosis not present

## 2019-12-17 DIAGNOSIS — Z79899 Other long term (current) drug therapy: Secondary | ICD-10-CM | POA: Diagnosis not present

## 2019-12-17 DIAGNOSIS — N433 Hydrocele, unspecified: Secondary | ICD-10-CM | POA: Insufficient documentation

## 2019-12-17 DIAGNOSIS — Z7984 Long term (current) use of oral hypoglycemic drugs: Secondary | ICD-10-CM | POA: Insufficient documentation

## 2019-12-17 DIAGNOSIS — I2510 Atherosclerotic heart disease of native coronary artery without angina pectoris: Secondary | ICD-10-CM | POA: Insufficient documentation

## 2019-12-17 DIAGNOSIS — N183 Chronic kidney disease, stage 3 unspecified: Secondary | ICD-10-CM | POA: Insufficient documentation

## 2019-12-17 DIAGNOSIS — I480 Paroxysmal atrial fibrillation: Secondary | ICD-10-CM | POA: Insufficient documentation

## 2019-12-17 DIAGNOSIS — Z955 Presence of coronary angioplasty implant and graft: Secondary | ICD-10-CM | POA: Diagnosis not present

## 2019-12-17 DIAGNOSIS — I129 Hypertensive chronic kidney disease with stage 1 through stage 4 chronic kidney disease, or unspecified chronic kidney disease: Secondary | ICD-10-CM | POA: Insufficient documentation

## 2019-12-17 DIAGNOSIS — Z7901 Long term (current) use of anticoagulants: Secondary | ICD-10-CM | POA: Diagnosis not present

## 2019-12-17 DIAGNOSIS — K219 Gastro-esophageal reflux disease without esophagitis: Secondary | ICD-10-CM | POA: Insufficient documentation

## 2019-12-17 DIAGNOSIS — E7800 Pure hypercholesterolemia, unspecified: Secondary | ICD-10-CM | POA: Insufficient documentation

## 2019-12-17 DIAGNOSIS — D176 Benign lipomatous neoplasm of spermatic cord: Secondary | ICD-10-CM | POA: Insufficient documentation

## 2019-12-17 DIAGNOSIS — K4090 Unilateral inguinal hernia, without obstruction or gangrene, not specified as recurrent: Secondary | ICD-10-CM | POA: Diagnosis present

## 2019-12-17 HISTORY — PX: INGUINAL HERNIA REPAIR: SHX194

## 2019-12-17 LAB — GLUCOSE, CAPILLARY
Glucose-Capillary: 192 mg/dL — ABNORMAL HIGH (ref 70–99)
Glucose-Capillary: 177 mg/dL — ABNORMAL HIGH (ref 70–99)

## 2019-12-17 SURGERY — REPAIR, HERNIA, INGUINAL, ADULT
Anesthesia: General | Laterality: Left

## 2019-12-17 MED ORDER — CEFAZOLIN SODIUM-DEXTROSE 2-4 GM/100ML-% IV SOLN
INTRAVENOUS | Status: AC
  Filled 2019-12-17: qty 100

## 2019-12-17 MED ORDER — DEXAMETHASONE SODIUM PHOSPHATE 10 MG/ML IJ SOLN
INTRAMUSCULAR | Status: AC
  Filled 2019-12-17: qty 1

## 2019-12-17 MED ORDER — FENTANYL CITRATE (PF) 100 MCG/2ML IJ SOLN
25.0000 ug | INTRAMUSCULAR | Status: DC | PRN
  Administered 2019-12-17: 50 ug via INTRAVENOUS

## 2019-12-17 MED ORDER — BUPIVACAINE-EPINEPHRINE (PF) 0.5% -1:200000 IJ SOLN
INTRAMUSCULAR | Status: DC | PRN
  Administered 2019-12-17: 30 mL

## 2019-12-17 MED ORDER — FENTANYL CITRATE (PF) 100 MCG/2ML IJ SOLN
INTRAMUSCULAR | Status: DC | PRN
  Administered 2019-12-17: 25 ug via INTRAVENOUS
  Administered 2019-12-17: 50 ug via INTRAVENOUS
  Administered 2019-12-17: 25 ug via INTRAVENOUS

## 2019-12-17 MED ORDER — MEPERIDINE HCL 50 MG/ML IJ SOLN: 6.2500 mg | INTRAMUSCULAR | Status: DC | PRN

## 2019-12-17 MED ORDER — SODIUM CHLORIDE 0.9 % IV SOLN
INTRAVENOUS | Status: DC
  Administered 2019-12-17: 50 mL/h via INTRAVENOUS

## 2019-12-17 MED ORDER — PHENYLEPHRINE HCL (PRESSORS) 10 MG/ML IV SOLN
INTRAVENOUS | Status: AC
  Filled 2019-12-17: qty 1

## 2019-12-17 MED ORDER — LIDOCAINE HCL (CARDIAC) PF 100 MG/5ML IV SOSY
PREFILLED_SYRINGE | INTRAVENOUS | Status: DC | PRN
  Administered 2019-12-17: 100 mg via INTRAVENOUS

## 2019-12-17 MED ORDER — KETOROLAC TROMETHAMINE 30 MG/ML IJ SOLN
INTRAMUSCULAR | Status: DC | PRN
  Administered 2019-12-17: 30 mg via INTRAVENOUS

## 2019-12-17 MED ORDER — ACETAMINOPHEN 10 MG/ML IV SOLN
INTRAVENOUS | Status: DC | PRN
  Administered 2019-12-17: 1000 mg via INTRAVENOUS

## 2019-12-17 MED ORDER — ONDANSETRON HCL 4 MG/2ML IJ SOLN
INTRAMUSCULAR | Status: DC | PRN
  Administered 2019-12-17: 4 mg via INTRAVENOUS

## 2019-12-17 MED ORDER — FENTANYL CITRATE (PF) 100 MCG/2ML IJ SOLN
INTRAMUSCULAR | Status: AC
  Filled 2019-12-17: qty 2

## 2019-12-17 MED ORDER — PROPOFOL 500 MG/50ML IV EMUL
INTRAVENOUS | Status: AC
  Filled 2019-12-17: qty 50

## 2019-12-17 MED ORDER — EPHEDRINE SULFATE 50 MG/ML IJ SOLN
INTRAMUSCULAR | Status: AC
  Filled 2019-12-17: qty 1

## 2019-12-17 MED ORDER — FENTANYL CITRATE (PF) 100 MCG/2ML IJ SOLN
INTRAMUSCULAR | Status: AC
  Administered 2019-12-17: 25 ug via INTRAVENOUS
  Filled 2019-12-17: qty 2

## 2019-12-17 MED ORDER — HYDROCODONE-ACETAMINOPHEN 5-325 MG PO TABS: 1.0000 | ORAL_TABLET | ORAL | 0 refills | Status: DC | PRN

## 2019-12-17 MED ORDER — SEVOFLURANE IN SOLN
RESPIRATORY_TRACT | Status: AC
  Filled 2019-12-17: qty 250

## 2019-12-17 MED ORDER — LIDOCAINE HCL (PF) 2 % IJ SOLN
INTRAMUSCULAR | Status: AC
  Filled 2019-12-17: qty 10

## 2019-12-17 MED ORDER — EPINEPHRINE PF 1 MG/ML IJ SOLN
INTRAMUSCULAR | Status: AC
  Filled 2019-12-17: qty 1

## 2019-12-17 MED ORDER — RIVAROXABAN 20 MG PO TABS: 20.0000 mg | ORAL_TABLET | Freq: Every day | ORAL | 0 refills | Status: DC

## 2019-12-17 MED ORDER — EPHEDRINE SULFATE 50 MG/ML IJ SOLN
INTRAMUSCULAR | Status: DC | PRN
  Administered 2019-12-17 (×2): 5 mg via INTRAVENOUS

## 2019-12-17 MED ORDER — ACETAMINOPHEN NICU IV SYRINGE 10 MG/ML
INTRAVENOUS | Status: AC
  Filled 2019-12-17: qty 2

## 2019-12-17 MED ORDER — PROPOFOL 10 MG/ML IV BOLUS
INTRAVENOUS | Status: DC | PRN
  Administered 2019-12-17: 150 mg via INTRAVENOUS
  Administered 2019-12-17: 50 mg via INTRAVENOUS

## 2019-12-17 MED ORDER — PROPOFOL 10 MG/ML IV BOLUS
INTRAVENOUS | Status: AC
  Filled 2019-12-17: qty 20

## 2019-12-17 MED ORDER — OXYCODONE HCL 5 MG PO TABS
ORAL_TABLET | ORAL | Status: AC
  Filled 2019-12-17: qty 1

## 2019-12-17 MED ORDER — ONDANSETRON HCL 4 MG/2ML IJ SOLN
INTRAMUSCULAR | Status: AC
  Filled 2019-12-17: qty 2

## 2019-12-17 MED ORDER — OXYCODONE HCL 5 MG PO TABS: 5.0000 mg | ORAL_TABLET | Freq: Once | ORAL | Status: DC | PRN

## 2019-12-17 MED ORDER — CEFAZOLIN SODIUM-DEXTROSE 2-4 GM/100ML-% IV SOLN
2.0000 g | INTRAVENOUS | Status: AC
  Administered 2019-12-17: 12:00:00 2 g via INTRAVENOUS

## 2019-12-17 MED ORDER — BUPIVACAINE HCL (PF) 0.5 % IJ SOLN
INTRAMUSCULAR | Status: AC
  Filled 2019-12-17: qty 30

## 2019-12-17 MED ORDER — OXYCODONE HCL 5 MG/5ML PO SOLN: 5.0000 mg | Freq: Once | ORAL | Status: DC | PRN

## 2019-12-17 MED ORDER — PROMETHAZINE HCL 25 MG/ML IJ SOLN: 6.2500 mg | INTRAMUSCULAR | Status: DC | PRN

## 2019-12-17 SURGICAL SUPPLY — 36 items
APL PRP STRL LF DISP 70% ISPRP (MISCELLANEOUS) ×1
BLADE SURG 15 STRL SS SAFETY (BLADE) ×6 IMPLANT
CANISTER SUCT 1200ML W/VALVE (MISCELLANEOUS) ×3 IMPLANT
CHLORAPREP W/TINT 26 (MISCELLANEOUS) ×3 IMPLANT
CLOSURE WOUND 1/2 X4 (GAUZE/BANDAGES/DRESSINGS) ×1
COVER WAND RF STERILE (DRAPES) ×3 IMPLANT
DECANTER SPIKE VIAL GLASS SM (MISCELLANEOUS) ×3 IMPLANT
DRAIN PENROSE 1/4X12 LTX STRL (WOUND CARE) ×3 IMPLANT
DRAPE LAPAROTOMY 100X77 ABD (DRAPES) ×3 IMPLANT
DRSG TEGADERM 4X4.75 (GAUZE/BANDAGES/DRESSINGS) ×3 IMPLANT
DRSG TELFA 4X3 1S NADH ST (GAUZE/BANDAGES/DRESSINGS) ×3 IMPLANT
ELECT REM PT RETURN 9FT ADLT (ELECTROSURGICAL) ×3
ELECTRODE REM PT RTRN 9FT ADLT (ELECTROSURGICAL) ×1 IMPLANT
GLOVE BIO SURGEON STRL SZ7.5 (GLOVE) ×3 IMPLANT
GLOVE INDICATOR 8.0 STRL GRN (GLOVE) ×3 IMPLANT
GOWN STRL REUS W/ TWL LRG LVL3 (GOWN DISPOSABLE) ×2 IMPLANT
GOWN STRL REUS W/TWL LRG LVL3 (GOWN DISPOSABLE) ×6
KIT TURNOVER KIT A (KITS) ×3 IMPLANT
LABEL OR SOLS (LABEL) ×3 IMPLANT
MESH HERNIA 6X12 ULTRAPRO MED (Mesh General) ×1 IMPLANT
MESH HERNIA ULTRAPRO MED (Mesh General) ×2 IMPLANT
NEEDLE HYPO 22GX1.5 SAFETY (NEEDLE) ×6 IMPLANT
PACK BASIN MINOR ARMC (MISCELLANEOUS) ×3 IMPLANT
STRIP CLOSURE SKIN 1/2X4 (GAUZE/BANDAGES/DRESSINGS) ×2 IMPLANT
SUT PDS AB 0 CT1 27 (SUTURE) ×3 IMPLANT
SUT SURGILON 0 BLK (SUTURE) ×6 IMPLANT
SUT VIC AB 2-0 SH 27 (SUTURE) ×3
SUT VIC AB 2-0 SH 27XBRD (SUTURE) ×1 IMPLANT
SUT VIC AB 3-0 54X BRD REEL (SUTURE) ×1 IMPLANT
SUT VIC AB 3-0 BRD 54 (SUTURE) ×3
SUT VIC AB 3-0 SH 27 (SUTURE) ×3
SUT VIC AB 3-0 SH 27X BRD (SUTURE) ×1 IMPLANT
SUT VIC AB 4-0 FS2 27 (SUTURE) ×3 IMPLANT
SWABSTK COMLB BENZOIN TINCTURE (MISCELLANEOUS) ×3 IMPLANT
SYR 10ML LL (SYRINGE) ×3 IMPLANT
SYR 3ML LL SCALE MARK (SYRINGE) ×3 IMPLANT

## 2019-12-17 NOTE — Anesthesia Preprocedure Evaluation (Signed)
Anesthesia Evaluation  Patient identified by MRN, date of birth, ID band Patient awake    Reviewed: Allergy & Precautions, NPO status , Patient's Chart, lab work & pertinent test results  History of Anesthesia Complications Negative for: history of anesthetic complications  Airway Mallampati: II  TM Distance: >3 FB Neck ROM: Full    Dental  (+) Poor Dentition   Pulmonary sleep apnea and Continuous Positive Airway Pressure Ventilation , neg COPD,    breath sounds clear to auscultation- rhonchi (-) wheezing      Cardiovascular hypertension, Pt. on medications (-) angina+ CAD and + Cardiac Stents (2011)  (-) CABG + dysrhythmias Atrial Fibrillation  Rhythm:Regular Rate:Normal - Systolic murmurs and - Diastolic murmurs Echo [id number]: 1. The left ventricle has hyperdynamic systolic function, with an ejection fraction of >65%. The cavity size was normal. There is moderate concentric left ventricular hypertrophy. Left ventricular diastolic Doppler parameters are consistent with  impaired relaxation. Normal GLS: -17.2%.  2. The right ventricle has normal systolic function. The cavity was normal. There is no increase in right ventricular wall thickness.  3. The aortic valve is tricuspid. Severely thickening of the aortic valve. Severe calcifcation of the aortic valve. Mild stenosis of the aortic valve.  4. There is moderate dilatation of the ascending aorta measuring 46 mm.   Neuro/Psych  Headaches, neg Seizures negative psych ROS   GI/Hepatic Neg liver ROS, GERD  ,  Endo/Other  diabetes, Oral Hypoglycemic Agents  Renal/GU Renal InsufficiencyRenal disease     Musculoskeletal  (+) Arthritis ,   Abdominal (+) + obese,   Peds  Hematology negative hematology ROS (+)   Anesthesia Other Findings Past Medical History: No date: Allergies     Comment:  HAS ALBUTEROL INHALER No date: Aortic valve stenosis     Comment:  mild 08/2018:  Brain bleed ([place])     Comment:  DUE TO FALL AND HIT HEAD-BLEED RESOLVED ON ITS OWN No date: CAD (coronary artery disease)     Comment:  prior CFX/RCA PCI 2011, cath [facility] 3/13 2017: Cancer ([place])     Comment:  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 No date: Chronic renal insufficiency, stage III (moderate) No date: DJD (degenerative joint disease)     Comment:  c-spine No date: Dyslipidemia No date: Dysrhythmia No date: GERD (gastroesophageal reflux disease) No date: Heart murmur No date: History of kidney stones     Comment:  X1 No date: Hypercholesteremia No date: Hypertension March 2013: PAF (paroxysmal atrial fibrillation) (HCC) No date: Sinus headache No date: Sleep apnea     Comment:  on C-Pap No date: Type 2 diabetes mellitus with chronic kidney disease (HCC)     Comment:  diet controlled  Reproductive/Obstetrics                             Anesthesia Physical Anesthesia Plan  ASA: III  Anesthesia Plan: General   Post-op Pain Management:    Induction: Intravenous  PONV Risk Score and Plan: 1 and Ondansetron and Dexamethasone  Airway Management Planned: LMA  Additional Equipment:   Intra-op Plan:   Post-operative Plan:   Informed Consent: I have reviewed the patients History and Physical, chart, labs and discussed the procedure including the risks, benefits and alternatives for the proposed anesthesia with the patient or authorized representative who has indicated his/her  understanding and acceptance.     Dental advisory given  Plan Discussed with: CRNA and Anesthesiologist  Anesthesia Plan Comments:         Anesthesia Quick Evaluation

## 2019-12-17 NOTE — Anesthesia Procedure Notes (Signed)
Procedure Name: LMA Insertion Date/Time: 12/17/2019 12:13 PM Performed by: Zetta Bills, CRNA Pre-anesthesia Checklist: Patient identified, Emergency Drugs available, Suction available and Patient being monitored Patient Re-evaluated:Patient Re-evaluated prior to induction Oxygen Delivery Method: Circle system utilized Preoxygenation: Pre-oxygenation with 100% oxygen Induction Type: IV induction LMA: LMA inserted LMA Size: 4.5 Tube type: Oral Number of attempts: 1

## 2019-12-17 NOTE — Discharge Instructions (Signed)

## 2019-12-17 NOTE — H&P (Signed)
Jason Neal [id number] 09/04/1951     HPI: 69 year old male for elective repair of a left inguinal hernia with associated hydrocele.  No change in clinical condition since last office visit.  Medications Prior to Admission  Medication Sig Dispense Refill Last Dose  . ACCU-CHEK AVIVA PLUS test strip    12/16/2019 at Unknown time  . albuterol (VENTOLIN HFA) 108 (90 Base) MCG/ACT inhaler Inhale 1-2 puffs into the lungs every 6 (six) hours as needed for wheezing or shortness of breath.   12/03/2019 at Unknown time  . aspirin EC 81 MG tablet Take 1 tablet (81 mg total) by mouth daily. 90 tablet 3 12/14/2019 at Unknown time  . atorvastatin (LIPITOR) 40 MG tablet Take 40 mg by mouth every evening.    12/16/2019 at 0800  . B Complex-C (B-COMPLEX WITH VITAMIN C) tablet Take 1 tablet by mouth every other day.    12/16/2019 at 0800  . carvedilol (COREG) 6.25 MG tablet Take 6.25 mg by mouth 2 (two) times daily.    12/17/2019 at 0730  . cetirizine (ZYRTEC) 10 MG tablet Take 10 mg by mouth every evening.    12/16/2019 at 0800  . cholecalciferol (VITAMIN D) 1000 UNITS tablet Take 1,000-2,000 Units by mouth See admin instructions. Take 1000 units in the morning and 2000 units in the evening   12/16/2019 at 0800  . diltiazem (CARTIA XT) 180 MG 24 hr capsule Take 180 mg by mouth every morning.    12/17/2019 at 0730  . fluticasone (FLONASE) 50 MCG/ACT nasal spray Place 1 spray into both nostrils daily as needed for allergies.    12/16/2019 at 0800  . glipiZIDE-metformin (METAGLIP) 2.5-500 MG tablet Take 1 tablet by mouth 2 (two) times daily before a meal.   12/16/2019 at 0800  . hydrALAZINE (APRESOLINE) 50 MG tablet Take 50 mg by mouth 2 (two) times daily.   12/17/2019 at 0730  . ipratropium (ATROVENT) 0.06 % nasal spray Place 2 sprays into both nostrils 3 (three) times daily as needed for rhinitis.   12/16/2019 at 0800  . Lancets (ACCU-CHEK MULTICLIX) lancets    12/16/2019 at 0800  . losartan (COZAAR) 100 MG tablet TAKE 1 TABLET  DAILY (Patient taking differently: Take 100 mg by mouth every morning. ) 90 tablet 0 12/16/2019 at 0800  . Multiple Vitamin (MULTIVITAMIN WITH MINERALS) TABS tablet Take 1 tablet by mouth every other day.   12/16/2019 at 0800  . nitroGLYCERIN (NITROSTAT) 0.4 MG SL tablet Place 0.4 mg under the tongue every 5 (five) minutes as needed for chest pain.   12/16/2019 at 0800  . ofloxacin (FLOXIN) 0.3 % OTIC solution Place 5 drops into both ears as needed.   12/16/2019 at 0800  . Omega-3 Fatty Acids (FISH OIL) 1200 MG CAPS Take 1,200 mg by mouth 2 (two) times daily.    12/16/2019 at 0800  . pantoprazole (PROTONIX) 40 MG tablet Take 40 mg by mouth every evening.    12/17/2019 at 0730  . Polyethyl Glycol-Propyl Glycol (SYSTANE) 0.4-0.3 % SOLN Place 1 drop into both eyes as needed (dry eyes).    12/16/2019 at 0800  . polyethylene glycol (MIRALAX / GLYCOLAX) 17 g packet Take 17 g by mouth daily as needed.    12/16/2019 at 0800  . rivaroxaban (XARELTO) 20 MG TABS tablet Take 1 tablet (20 mg total) by mouth daily. Resume this on 9/21 (Patient taking differently: Take 20 mg by mouth daily. ) 30 tablet 0 12/16/2019 at 0800  . sodium  chloride (OCEAN) 0.65 % SOLN nasal spray Place 2 sprays into both nostrils 2 (two) times daily. (Patient not taking: Reported on 11/26/2019)  0 Not Taking at Unknown time   Allergies  Allergen Reactions  . Angiotensin Receptor Blockers Cough    (06/18/19 pt denies)  . Demeclocycline Other (See Comments)    AS ACHILD   Past Medical History:  Diagnosis Date  . Allergies    HAS ALBUTEROL INHALER  . Aortic valve stenosis    mild  . Brain bleed ([place]) 08/2018   DUE TO FALL AND HIT HEAD-BLEED RESOLVED ON ITS OWN  . CAD (coronary artery disease)    prior CFX/RCA PCI 2011, cath [facility] 3/13  . Cancer ([place]) 2017   MELANOMA RIGHT EAR  . Carotid bruit 03/14/11   CAROTID DUPLEX DOPPLER-Right bulb; demonstrated a mild amount of fibrous plaque w/o evidence of a significant diameter reduction, dissection or any  other vascular abnormality.;Left ICA;-proximal, mid and distal segments of the internal carotid artery normal patency.mid and distal segments demonstrating moderate tortuosity..Mildly abnormal carotid duplex scan.  . Chronic renal insufficiency, stage III (moderate)   . DJD (degenerative joint disease)    c-spine  . Dyslipidemia   . Dysrhythmia   . GERD (gastroesophageal reflux disease)   . Heart murmur   . History of kidney stones    X1  . Hypercholesteremia   . Hypertension   . PAF (paroxysmal atrial fibrillation) [facility]) March 2013  . Sinus headache   . Sleep apnea    on C-Pap  . Type 2 diabetes mellitus with chronic kidney disease ([place])    diet controlled   Past Surgical History:  Procedure Laterality Date  . C-spine surgery  2001  . cardiac catherization  03/04/12   Dr Gwenlyn Found  . CARDIOVERSION  03/07/2012   Procedure: CARDIOVERSION;  Surgeon: Sanda Klein, MD;  Location: [place];  Service: Cardiovascular;  Laterality: N/A;  . CATARACT EXTRACTION W/PHACO Right 06/25/2019   Procedure: CATARACT EXTRACTION PHACO AND INTRAOCULAR LENS PLACEMENT ([place]) RIGHT;  Surgeon: Leandrew Koyanagi, MD;  Location: [place];  Service: Ophthalmology;  Laterality: Right;  Diabetic - oral meds sleep apnea  . CATARACT EXTRACTION W/PHACO Left 07/23/2019   Procedure: CATARACT EXTRACTION PHACO AND INTRAOCULAR LENS PLACEMENT ([place]) LEFT DIABETIC;  Surgeon: Leandrew Koyanagi, MD;  Location: [place];  Service: Ophthalmology;  Laterality: Left;  Diabetic - oral meds  . COLONOSCOPY WITH PROPOFOL N/A 01/05/2017   Procedure: COLONOSCOPY WITH PROPOFOL;  Surgeon: Lollie Sails, MD;  Location: [facility] ENDOSCOPY;  Service: Endoscopy;  Laterality: N/A;  . CORONARY ANGIOPLASTY WITH STENT PLACEMENT  Dec 2011   [place]  . LEFT HEART CATHETERIZATION WITH CORONARY ANGIOGRAM N/A 03/04/2012   Procedure: LEFT HEART CATHETERIZATION WITH CORONARY ANGIOGRAM;  Surgeon:  Lorretta Harp, MD;  Location[facility] CATH LAB;  Service: Cardiovascular;  Laterality: N/A;  . TEE WITHOUT CARDIOVERSION  03/07/2012   Procedure: TRANSESOPHAGEAL ECHOCARDIOGRAM (TEE);  Surgeon: Sanda Klein, MD;  Location: [facility] ENDOSCOPY;  Service: Cardiovascular;  Laterality: N/A;   Social History   Socioeconomic History  . Marital status: Married    Spouse name: Not on file  . Number of children: Not on file  . Years of education: Not on file  . Highest education level: Not on file  Occupational History  . Not on file  Tobacco Use  . Smoking status: Never Smoker  . Smokeless tobacco: Never Used  Substance and Sexual Activity  .  Alcohol use: No  . Drug use: Never  . Sexual activity: Not on file  Other Topics Concern  . Not on file  Social History Narrative  . Not on file   Social Determinants of Health   Financial Resource Strain:   . Difficulty of Paying Living Expenses: Not on file  Food Insecurity:   . Worried About Charity fundraiser in the Last Year: Not on file  . Ran Out of Food in the Last Year: Not on file  Transportation Needs:   . Lack of Transportation (Medical): Not on file  . Lack of Transportation (Non-Medical): Not on file  Physical Activity:   . Days of Exercise per Week: Not on file  . Minutes of Exercise per Session: Not on file  Stress:   . Feeling of Stress : Not on file  Social Connections:   . Frequency of Communication with Friends and Family: Not on file  . Frequency of Social Gatherings with Friends and Family: Not on file  . Attends Religious Services: Not on file  . Active Member of Clubs or Organizations: Not on file  . Attends Archivist Meetings: Not on file  . Marital Status: Not on file  Intimate Partner Violence:   . Fear of Current or Ex-Partner: Not on file  . Emotionally Abused: Not on file  . Physically Abused: Not on file  . Sexually Abused: Not on file   Social History   Social History Narrative  . Not on file      ROS: Negative.     PE: HEENT: Negative. Lungs: Clear. Cardio: RR.  Assessment/Plan:  Proceed with planned left inguinal hernia repair,hydrocelectomy.   [facility] 12/17/2019

## 2019-12-17 NOTE — Transfer of Care (Signed)
Immediate Anesthesia Transfer of Care Note  Patient: Jason Neal  Procedure(s) Performed: HERNIA REPAIR INGUINAL ADULT (Left )  Patient Location: PACU  Anesthesia Type:General  Level of Consciousness: awake  Airway & Oxygen Therapy: Patient Spontanous Breathing  Post-op Assessment: Report given to RN  Post vital signs: stable  Last Vitals:  Vitals Value Taken Time  BP 154/79 12/17/19 1350  Temp 35.9 C 12/17/19 1350  Pulse 69 12/17/19 1355  Resp 16 12/17/19 1350  SpO2 91 % 12/17/19 1355  Vitals shown include unvalidated device data.  Last Pain:  Vitals:   12/17/19 1350  TempSrc:   PainSc: 4          Complications: No apparent anesthesia complications

## 2019-12-17 NOTE — Op Note (Signed)
Preoperative diagnosis: Left inguinal hernia and hydrocele.  Postoperative diagnosis: Massive lipoma of the cord, left.  Operative procedure: Left inguinal exploration, partial resection of lipoma the cord, groin reinforcement with medium Ultra Pro mesh.  Operating surgeon: Hervey Ard, MD.  Anesthesia: General by LMA, Marcaine 0.5% with 1-200,000's of epinephrine, 30 cc, Toradol 30 mg.  Estimated blood loss: Less than 5 cc.  Clinical note: This 69 year old male presented with a left inguinal swelling with extension into the proximal aspect of the scrotum.  He was felt to have a hernia and hydrocele.  He was admitted for elective repair.  He received Ancef prior to the procedure.  Hair was removed from the surgical site with clippers prior to presentation of the operating theater.  SCD stockings for DVT prevention.  Operative note: With the patient under adequate general anesthesia the abdomen penis and scrotum were cleansed with Betadine solution and draped.  Field block anesthesia was placed with the above-mentioned local anesthetic.  A 5 cm skin line incision along the anticipated course the inguinal canal was carried down through skin subtendinous tissue with hemostasis achieved by electrocautery and 3-0 Vicryl ties.  The external oblique was opened in the direction of its fibers.  The ilioinguinal and iliohypogastric nerves were identified and protected.  A massive lipoma the cord was identified and dissected free from the adjacent cord structures.  The cord itself was dissected below of the internal ring with no evidence of an indirect sac.  The medial floor was intact.  The major portion of the lipoma was excised with hemostasis achieved by 3-0 Vicryl ties.  The smaller portion was reduced into the preperitoneal space.  A medium Ultra Pro mesh was smoothed into the preperitoneal space and the external component along the floor the inguinal canal.  This was anchored to the pubic tubercle  with 0 Surgilon.  Interrupted sutures of 0 Surgilon were used to approximate the mesh to the inguinal ligament.  The medial and superior borders were anchored to the transverse abdominis aponeurosis.  A lateral slit for cord passage was made and closed with interrupted 6 Surgilon sutures.  Toradol was placed into the wound.  The external oblique fascia was closed with a running 2-0 Vicryl suture after returning the ilioinguinal and iliohypogastric nerves to the bed.  Scarpa's fascia was closed with a running 3-0 Vicryl suture.  Skin was closed with a running 4-0 Vicryl subcuticular suture.  Benzoin, Steri-Strips, Telfa and Tegaderm dressings were applied.  The patient tolerated the procedure well and was taken recovery room in stable condition.

## 2019-12-19 NOTE — Anesthesia Postprocedure Evaluation (Signed)
Anesthesia Post Note  Patient: Jason Neal  Procedure(s) Performed: HERNIA REPAIR INGUINAL ADULT (Left )  Patient location during evaluation: Endoscopy Anesthesia Type: General Level of consciousness: awake and alert and oriented Pain management: pain level controlled Vital Signs Assessment: post-procedure vital signs reviewed and stable Respiratory status: spontaneous breathing, nonlabored ventilation and respiratory function stable Cardiovascular status: blood pressure returned to baseline and stable Postop Assessment: no signs of nausea or vomiting Anesthetic complications: no     Last Vitals:  Vitals:   12/17/19 1507 12/17/19 1620  BP: (!) 166/78 (!) 174/79  Pulse: 61 66  Resp: 16   Temp: (!) 36.2 C   SpO2: 97% 98%    Last Pain:  Vitals:   12/18/19 0839  TempSrc:   PainSc: 3                  Hermelinda Diegel

## 2020-01-16 ENCOUNTER — Ambulatory Visit: Payer: Medicare Other

## 2020-01-17 ENCOUNTER — Ambulatory Visit: Payer: Medicare Other | Attending: Internal Medicine

## 2020-01-17 DIAGNOSIS — Z23 Encounter for immunization: Secondary | ICD-10-CM

## 2020-01-17 NOTE — Progress Notes (Signed)
   Covid-19 Vaccination Clinic  Name:  Jason Neal    MRN: [id number] DOB: [birth year]  01/17/2020  Jason Neal was observed post Covid-19 immunization for 30 minutes based on pre-vaccination screening without incidence. He was provided with Vaccine Information Sheet and instruction to access the V-Safe system.   Jason Neal was instructed to call 911 with any severe reactions post vaccine: [name] Difficulty breathing  . Swelling of your face and throat  . A fast heartbeat  . A bad rash all over your body  . Dizziness and weakness    Immunizations Administered    Name Date Dose VIS Date Route   Pfizer COVID-19 Vaccine 01/17/2020  8:29 AM 0.3 mL 11/21/2019 Intramuscular   Manufacturer: [place]   Lot: [id number]   [place]: [id number]

## 2020-02-10 ENCOUNTER — Ambulatory Visit: Payer: Medicare Other | Attending: Internal Medicine

## 2020-02-10 ENCOUNTER — Ambulatory Visit: Payer: Medicare Other

## 2020-02-10 DIAGNOSIS — Z23 Encounter for immunization: Secondary | ICD-10-CM | POA: Insufficient documentation

## 2020-02-10 NOTE — Progress Notes (Signed)
   Covid-19 Vaccination Clinic  Name:  Jason Neal    MRN: [id number] DOB: May 17, 1951  02/10/2020  [name] was observed post Covid-19 immunization for 15 minutes without incident. He was provided with Vaccine Information Sheet and instruction to access the V-Safe system.   [name] was instructed to call 911 with any severe reactions post vaccine: [name] Difficulty breathing  . Swelling of face and throat  . A fast heartbeat  . A bad rash all over body  . Dizziness and weakness   Immunizations Administered    Name Date Dose VIS Date Route   Pfizer COVID-19 Vaccine 02/10/2020 11:57 AM 0.3 mL 11/21/2019 Intramuscular   Manufacturer: [place]   Lot: [id number]   [place]: [id number]

## 2020-06-28 ENCOUNTER — Ambulatory Visit (HOSPITAL_COMMUNITY): Payer: Medicare Other | Attending: Cardiovascular Disease

## 2020-06-28 ENCOUNTER — Other Ambulatory Visit: Payer: Self-pay

## 2020-06-28 DIAGNOSIS — I350 Nonrheumatic aortic (valve) stenosis: Secondary | ICD-10-CM | POA: Diagnosis present

## 2020-06-28 LAB — ECHOCARDIOGRAM COMPLETE
AR max vel: 1.24 cm2
AV Area VTI: 1.31 cm2
AV Area mean vel: 1.15 cm2
AV Mean grad: 14 mmHg
AV Peak grad: 25.8 mmHg
Ao pk vel: 2.54 m/s
Area-P 1/2: 3.68 cm2
S' Lateral: 2.9 cm

## 2020-07-02 ENCOUNTER — Ambulatory Visit (INDEPENDENT_AMBULATORY_CARE_PROVIDER_SITE_OTHER): Payer: Medicare Other | Admitting: Cardiovascular Disease

## 2020-07-02 ENCOUNTER — Encounter: Payer: Self-pay | Admitting: Cardiovascular Disease

## 2020-07-02 ENCOUNTER — Other Ambulatory Visit: Payer: Self-pay

## 2020-07-02 VITALS — BP 140/76 | HR 59 | Ht 72.0 in | Wt 236.6 lb

## 2020-07-02 DIAGNOSIS — I350 Nonrheumatic aortic (valve) stenosis: Secondary | ICD-10-CM | POA: Diagnosis not present

## 2020-07-02 DIAGNOSIS — I480 Paroxysmal atrial fibrillation: Secondary | ICD-10-CM | POA: Diagnosis not present

## 2020-07-02 DIAGNOSIS — E785 Hyperlipidemia, unspecified: Secondary | ICD-10-CM | POA: Diagnosis not present

## 2020-07-02 DIAGNOSIS — I2510 Atherosclerotic heart disease of native coronary artery without angina pectoris: Secondary | ICD-10-CM | POA: Diagnosis not present

## 2020-07-02 DIAGNOSIS — I10 Essential (primary) hypertension: Secondary | ICD-10-CM

## 2020-07-02 DIAGNOSIS — G4733 Obstructive sleep apnea (adult) (pediatric): Secondary | ICD-10-CM

## 2020-07-02 NOTE — Patient Instructions (Addendum)
Medication Instructions:  NO CHANGE *If you need a refill on your cardiac medications before your next appointment, please call your pharmacy*   Lab Work: If you have labs (blood work) drawn today and your tests are completely normal, you will receive your results only by: [name] MyChart Message (if you have MyChart) OR . A paper copy in the mail If you have any lab test that is abnormal or we need to change your treatment, we will call you to review the results.  Testing:  Your physician has requested that you have an echocardiogram. Echocardiography is a painless test that uses sound waves to create images of your heart. It provides your doctor with information about the size and shape of your heart and how well your heart's chambers and valves are working. This procedure takes approximately one hour. There are no restrictions for this procedure.  [address]-schedule in one year  Follow-Up: At [facility], you and your health needs are our priority.  As part of our continuing mission to provide you with exceptional heart care, we have created designated Provider Care Teams.  These Care Teams include your primary Cardiologist (physician) and Advanced Practice Providers (APPs -  Physician Assistants and Nurse Practitioners) who all work together to provide you with the care you need, when you need it.  We recommend signing up for the patient portal called "MyChart".  Sign up information is provided on this After Visit Summary.  MyChart is used to connect with patients for Virtual Visits (Telemedicine).  Patients are able to view lab/test results, encounter notes, upcoming appointments, etc.  Non-urgent messages can be sent to your provider as well.   To learn more about what you can do with MyChart, go to [email or web address].    Your next appointment:   12 month(s)  The format for your next appointment:   In Person  Provider:   You may see Quay Burow MD or one of the  following Advanced Practice Providers on your designated Care Team:    Kerin Ransom, PA-C  [place], [place]  Coletta Memos, [place]

## 2020-07-02 NOTE — Assessment & Plan Note (Signed)
History of mild aortic stenosis with recent 2D echo performed 06/28/2020 revealing normal LV systolic function with aortic aortic valve area of 1.31 cm and a mean gradient of 14 mmHg.  He has a soft outflow tract murmur.  He is very active and is asymptomatic.  This will be followed on an annual basis.

## 2020-07-02 NOTE — Progress Notes (Signed)
07/02/2020 Jason Neal   [birth year]  [id number]  Primary Physician Kirk Ruths, MD Primary Cardiologist: Lorretta Harp MD [name], [name]  HPI:  Jason Neal is a 69 y.o.    mildly overweight fit appearing married Caucasian male father of 2, grandfather of 3 grandchildren whose wife is also a patient of mine.his wife apparently has recently retired. 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  His mother-in-law passed away 06-05-19 as well.. I last saw him in the office  07/02/2019.[name] His daughter is a Publishing copy at the [employer].  He has a history of CAD with stenting of his circumflex and RCA in the past. He was last cathed March 02, 2012 revealing patent stents with 40-50% mid AV groove circumflex and proximal LAD stenosis with normal LV function. His other problems include history of PAF with RVR, cardioverted to sinus rhythm on Xarelto. He has hypertension, hyperlipidemia and obstructive sleep apnea on CPAP. 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  Since I saw him a year ago he has done well.  He has had bilateral cataract removal, herniorrhaphy and Alekh lipoma removed.  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  He has no symptoms.    Current Meds  Medication Sig  . ACCU-CHEK AVIVA PLUS test strip   . albuterol (VENTOLIN HFA) 108 (90 Base) MCG/ACT inhaler Inhale 1-2 puffs into the lungs every 6 (six) hours as needed for wheezing or shortness of breath.  [name] aspirin EC 81 MG tablet Take 1 tablet (81 mg total) by mouth daily.  [name] atorvastatin (LIPITOR) 40 MG tablet Take 40 mg by mouth every evening.   . B Complex-C (B-COMPLEX WITH VITAMIN C) tablet Take 1 tablet by mouth every other day.   .  carvedilol (COREG) 6.25 MG tablet Take 6.25 mg by mouth 2 (two) times daily.   . cetirizine (ZYRTEC) 10 MG tablet Take 10 mg by mouth every evening.   . cholecalciferol (VITAMIN D) 1000 UNITS tablet Take 1,000-2,000 Units by mouth See admin instructions. Take 1000 units in the morning and 2000 units in the evening  . diltiazem (CARTIA XT) 180 MG 24 hr capsule Take 180 mg by mouth every morning.   . fluticasone (FLONASE) 50 MCG/ACT nasal spray Place 1 spray into both nostrils daily as needed for allergies.   [name] glipiZIDE-metformin (METAGLIP) 2.5-500 MG tablet Take 1 tablet by mouth 2 (two) times daily before a meal.  . hydrALAZINE (APRESOLINE) 50 MG tablet Take 50 mg by mouth 2 (two) times daily.  [name] ipratropium (ATROVENT) 0.06 % nasal spray Place 2 sprays into both nostrils 3 (three) times daily as needed for rhinitis.  . Lancets (ACCU-CHEK MULTICLIX) lancets   . losartan (COZAAR) 100 MG tablet TAKE 1 TABLET DAILY (Patient taking differently: Take 100 mg by mouth every morning. )  . Multiple Vitamin (MULTIVITAMIN WITH MINERALS) TABS tablet Take 1 tablet by mouth every other day.  . nitroGLYCERIN (NITROSTAT) 0.4 MG SL tablet Place 0.4 mg under the tongue every 5 (five) minutes as needed for chest pain.  [name] ofloxacin (FLOXIN) 0.3 % OTIC solution Place 5 drops into both ears as needed.  . Omega-3 Fatty Acids (FISH OIL) 1200 MG  CAPS Take 1,200 mg by mouth 2 (two) times daily.   . pantoprazole (PROTONIX) 40 MG tablet Take 40 mg by mouth every evening.   Vladimir Faster Glycol-Propyl Glycol (SYSTANE) 0.4-0.3 % SOLN Place 1 drop into both eyes as needed (dry eyes).   . polyethylene glycol (MIRALAX / GLYCOLAX) 17 g packet Take 17 g by mouth daily as needed.   . rivaroxaban (XARELTO) 20 MG TABS tablet Take 1 tablet (20 mg total) by mouth daily with supper.  . sodium chloride (OCEAN) 0.65 % SOLN nasal spray Place 2 sprays into both nostrils 2 (two) times daily.  . [DISCONTINUED] HYDROcodone-acetaminophen  (NORCO/VICODIN) 5-325 MG tablet Take 1 tablet by mouth every 4 (four) hours as needed for moderate pain.     Allergies  Allergen Reactions  . Angiotensin Receptor Blockers Cough    (06/18/19 pt denies)  . Demeclocycline Other (See Comments)    AS ACHILD    Social History   Socioeconomic History  . Marital status: Married    Spouse name: Not on file  . Number of children: Not on file  . Years of education: Not on file  . Highest education level: Not on file  Occupational History  . Not on file  Tobacco Use  . Smoking status: Never Smoker  . Smokeless tobacco: Never Used  Vaping Use  . Vaping Use: Never used  Substance and Sexual Activity  . Alcohol use: No  . Drug use: Never  . Sexual activity: Not on file  Other Topics Concern  . Not on file  Social History Narrative  . Not on file   Social Determinants of Health   Financial Resource Strain:   . Difficulty of Paying Living Expenses:   Food Insecurity:   . Worried About Charity fundraiser in the Last Year:   . Arboriculturist in the Last Year:   Transportation Needs:   . Film/video editor (Medical):   [name] Lack of Transportation (Non-Medical):   Physical Activity:   . Days of Exercise per Week:   . Minutes of Exercise per Session:   Stress:   . Feeling of Stress :   Social Connections:   . Frequency of Communication with Friends and Family:   . Frequency of Social Gatherings with Friends and Family:   . Attends Religious Services:   . Active Member of Clubs or Organizations:   . Attends Archivist Meetings:   [name] Marital Status:   Intimate Partner Violence:   . Fear of Current or Ex-Partner:   . Emotionally Abused:   [name] Physically Abused:   . Sexually Abused:      Review of Systems: General: negative for chills, fever, night sweats or weight changes.  Cardiovascular: negative for chest pain, dyspnea on exertion, edema, orthopnea, palpitations, paroxysmal nocturnal dyspnea or shortness of  breath Dermatological: negative for rash Respiratory: negative for cough or wheezing Urologic: negative for hematuria Abdominal: negative for nausea, vomiting, diarrhea, bright red blood per rectum, melena, or hematemesis Neurologic: negative for visual changes, syncope, or dizziness All other systems reviewed and are otherwise negative except as noted above.    Blood pressure (!) 140/76, pulse 59, height 6' (1.829 m), weight (!) 236 lb 9.6 oz (107.3 kg).  General appearance: alert and no distress Neck: no adenopathy, no carotid bruit, no JVD, supple, symmetrical, trachea midline and thyroid not enlarged, symmetric, no tenderness/mass/nodules Lungs: clear to auscultation bilaterally Heart: regular rate and rhythm, S1, S2 normal, no murmur,  click, rub or gallop, regular rate and rhythm and 2/6 outflow tract murmur consistent with aortic stenosis Extremities: extremities normal, atraumatic, no cyanosis or edema Pulses: 2+ and symmetric Skin: Skin color, texture, turgor normal. No rashes or lesions Neurologic: Alert and oriented X 3, normal strength and tone. Normal symmetric reflexes. Normal coordination and gait  EKG sinus bradycardia at 59 without ST or T wave changes.  I personally reviewed this EKG.  ASSESSMENT AND PLAN:   PAF successful cardioversion  03/07/12 to SR/SB History of PAF maintaining sinus rhythm on Xarelto oral anticoagulation  CAD, prior CFX/RCA DES Dec 2011, cath 03/04/12  patent stents- stable CAD History of CAD status post stenting of his circumflex and RCA in the past.  He was last cath 03/02/2012 revealing widely patent stents with 40 to 50% mid AV groove circumflex stenosis and proximal LAD stenosis with normal LV function.  He denies chest pain or shortness of breath.  Dyslipidemia History of dyslipidemia on statin therapy with recent lipid profile performed by his PCP 05/27/2020 revealing total cholesterol 164, LDL 62 and HDL of 37.  His triglycerides like to  remain elevated at 322.  HTN (hypertension) History of essential hypertension blood pressure measured today 140/76.  He is on carvedilol, diltiazem, losartan and hydralazine.  Sleep apnea, on C-Pap History of obstructive sleep apnea on CPAP  Aortic stenosis, mild History of mild aortic stenosis with recent 2D echo performed 06/28/2020 revealing normal LV systolic function with aortic aortic valve area of 1.31 cm and a mean gradient of 14 mmHg.  He has a soft outflow tract murmur.  He is very active and is asymptomatic.  This will be followed on an annual basis.      Lorretta Harp MD FACP,FACC,FAHA, [facility] 07/02/2020 9:07 AM

## 2020-07-02 NOTE — Assessment & Plan Note (Signed)
History of dyslipidemia on statin therapy with recent lipid profile performed by his PCP 05/27/2020 revealing total cholesterol 164, LDL 62 and HDL of 37.  His triglycerides like to remain elevated at 322.

## 2020-07-02 NOTE — Assessment & Plan Note (Signed)
History of essential hypertension blood pressure measured today 140/76.  He is on carvedilol, diltiazem, losartan and hydralazine.

## 2020-07-02 NOTE — Assessment & Plan Note (Signed)
History of PAF maintaining sinus rhythm on Xarelto oral anticoagulation. °

## 2020-07-02 NOTE — Assessment & Plan Note (Signed)
History of obstructive sleep apnea on CPAP. 

## 2020-07-02 NOTE — Assessment & Plan Note (Signed)
History of CAD status post stenting of his circumflex and RCA in the past.  He was last cath 03/02/2012 revealing widely patent stents with 40 to 50% mid AV groove circumflex stenosis and proximal LAD stenosis with normal LV function.  He denies chest pain or shortness of breath.

## 2020-07-19 ENCOUNTER — Other Ambulatory Visit (HOSPITAL_COMMUNITY): Payer: Medicare Other

## 2020-08-25 ENCOUNTER — Telehealth: Payer: Self-pay | Admitting: Cardiovascular Disease

## 2020-08-25 NOTE — Telephone Encounter (Signed)
Spoke with patient regarding stopping his Aspirin and Xarelto for 3-5 days due to a nose bleed.- spoke with Dr. Gwenlyn Found regarding this per verbal order per Dr. Gwenlyn Found "okay for patient to hold his xarelto and aspirin for 3 days". Advised patient of Dr. Kennon Holter advice. Patient verbalized understanding.

## 2020-08-25 NOTE — Telephone Encounter (Signed)
New Message   Pt c/o medication issue:  1. Name of Medication: aspirin EC 81 MG tablet  rivaroxaban (XARELTO) 20 MG TABS tablet    2. How are you currently taking this medication (dosage and times per day)? Both 1 x daily   3. Are you having a reaction (difficulty breathing--STAT)? No   4. What is your medication issue? Pt saw the ENT today and wanted to know if the patient can stop taking Aspirin and Xarelto for 5 days to help the injury to his nose heal. Pt says he is having watered down blood still.   Please call

## 2021-04-26 ENCOUNTER — Other Ambulatory Visit: Payer: Self-pay | Admitting: Unknown Physician Specialty

## 2021-04-26 DIAGNOSIS — R221 Localized swelling, mass and lump, neck: Secondary | ICD-10-CM

## 2021-05-25 ENCOUNTER — Other Ambulatory Visit: Payer: Self-pay

## 2021-05-25 ENCOUNTER — Ambulatory Visit
Admission: RE | Admit: 2021-05-25 | Discharge: 2021-05-25 | Disposition: A | Discharge status: Home / Self Care | Payer: Medicare Other | Source: Ambulatory Visit | Attending: Unknown Physician Specialty | Admitting: Unknown Physician Specialty

## 2021-05-25 DIAGNOSIS — R221 Localized swelling, mass and lump, neck: Secondary | ICD-10-CM | POA: Diagnosis not present

## 2021-05-25 IMAGING — US US THYROID
1 series · 13 of 25 positions shown · non-contrast
Comparison: None.

CLINICAL DATA: Bilateral neck mass

EXAM:
THYROID ULTRASOUND
TECHNIQUE: Ultrasound examination of the thyroid gland and adjacent soft
tissues was performed.

[Series 1: us thyroid · 0.07mm/px · 13 of 40 slices shown]
[im 1/40]
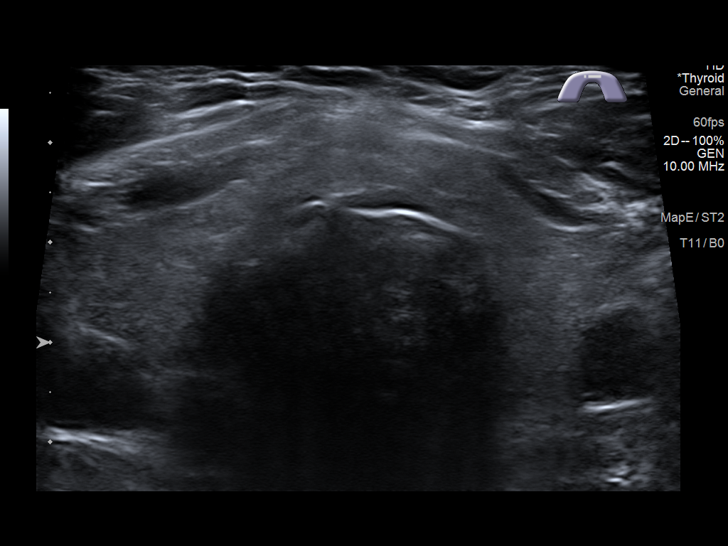
[im 4/40]
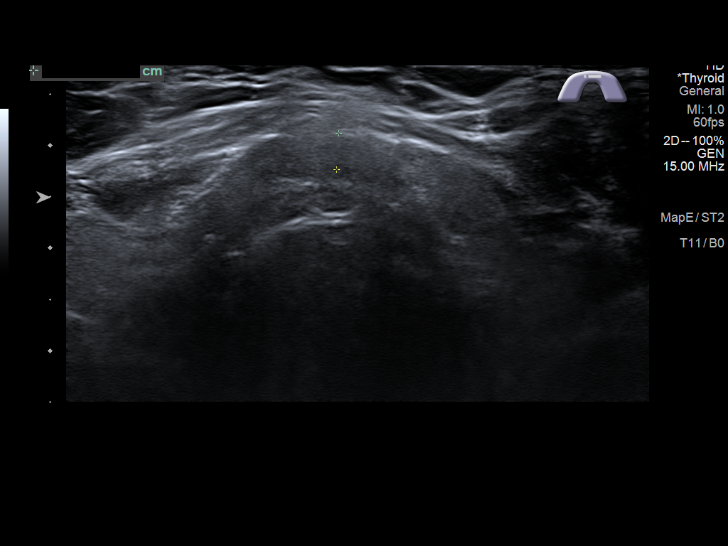
[im 7/40]
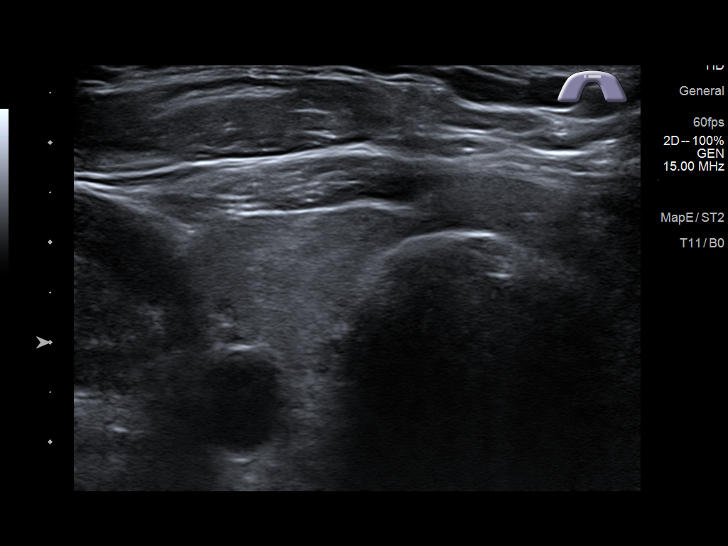
[im 10/40]
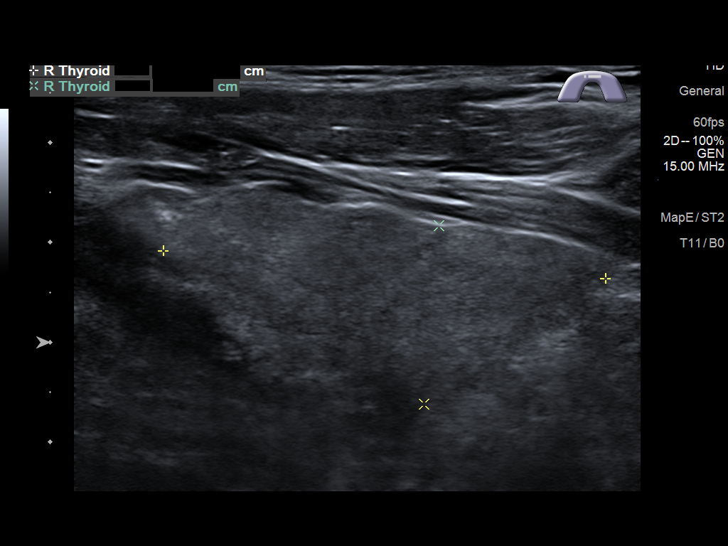
[im 14/40]
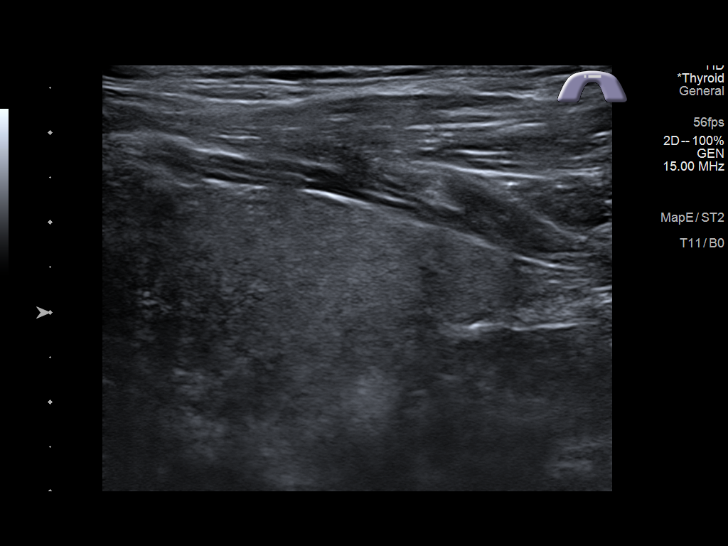
[im 17/40]
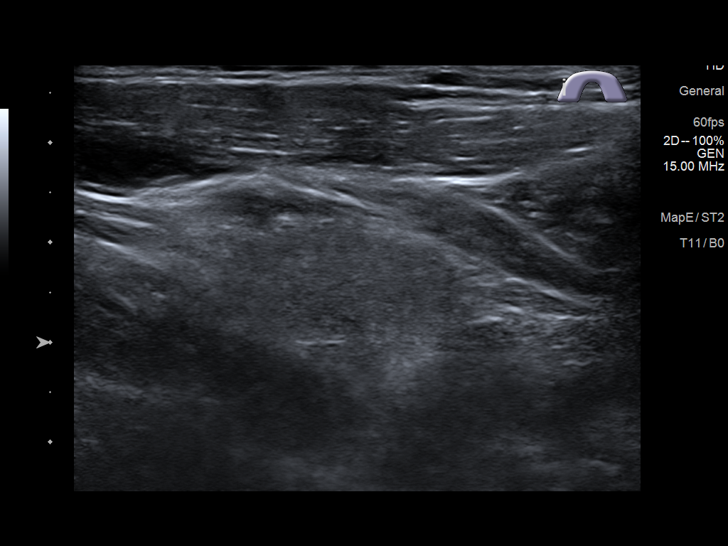
[im 20/40]
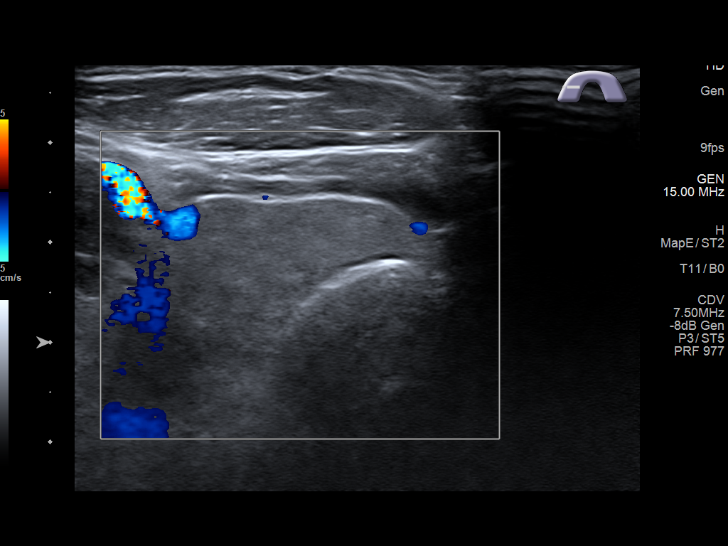
[im 23/40]
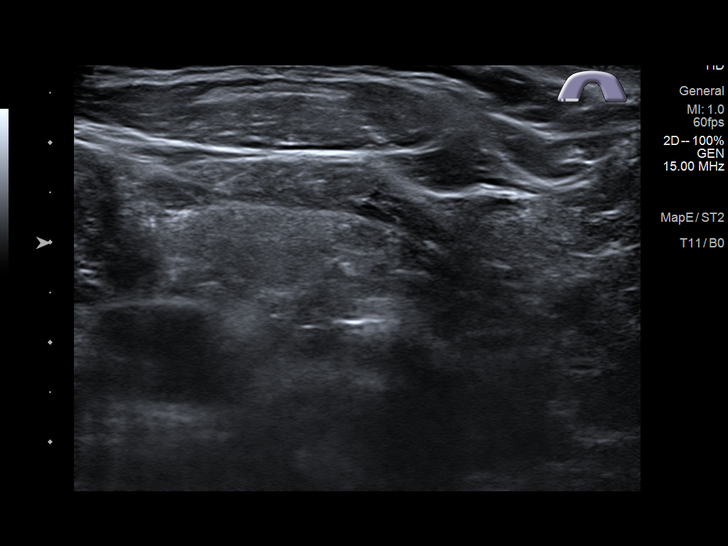
[im 27/40]
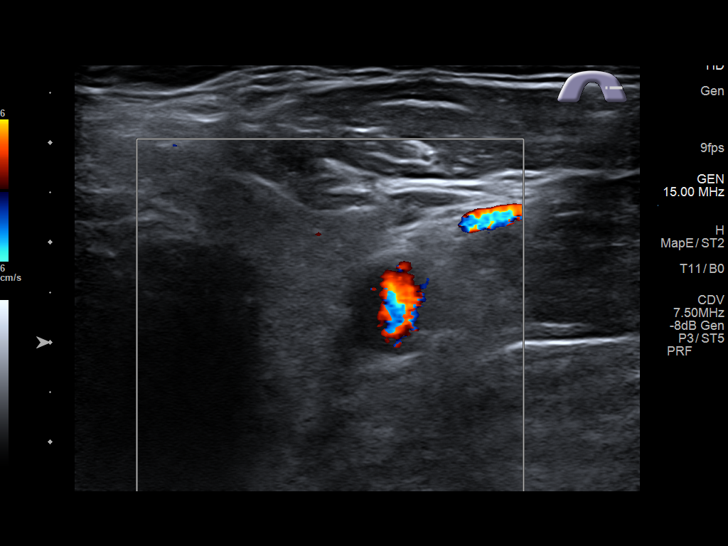
[im 30/40]
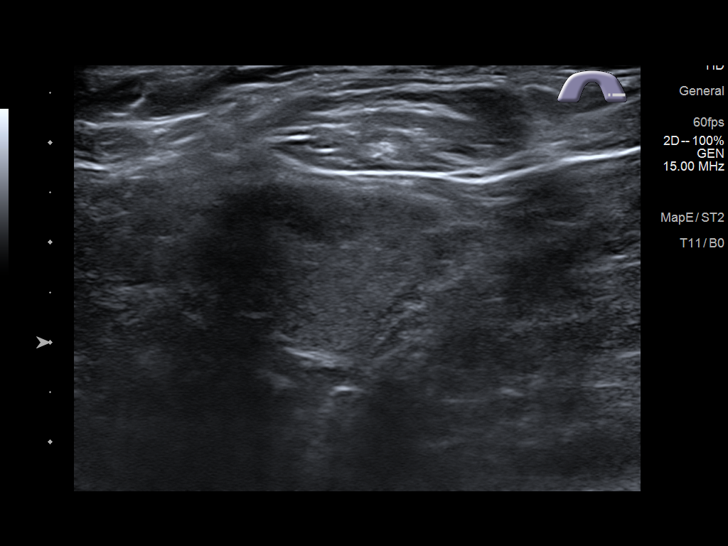
[im 33/40]
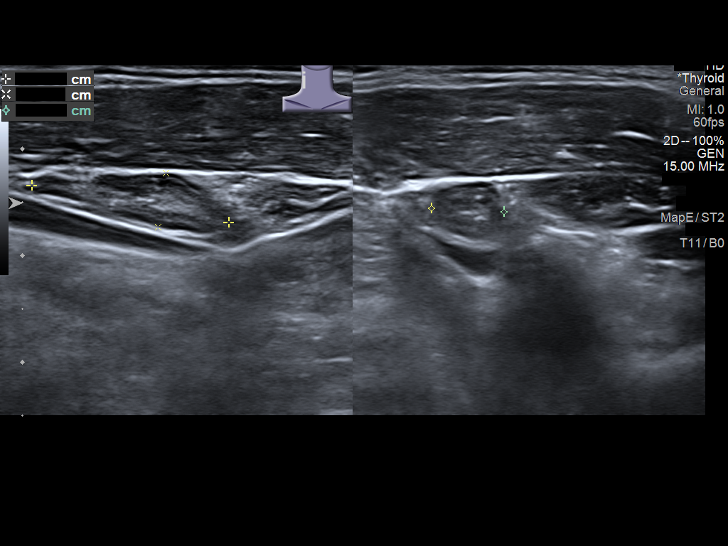
[im 36/40]
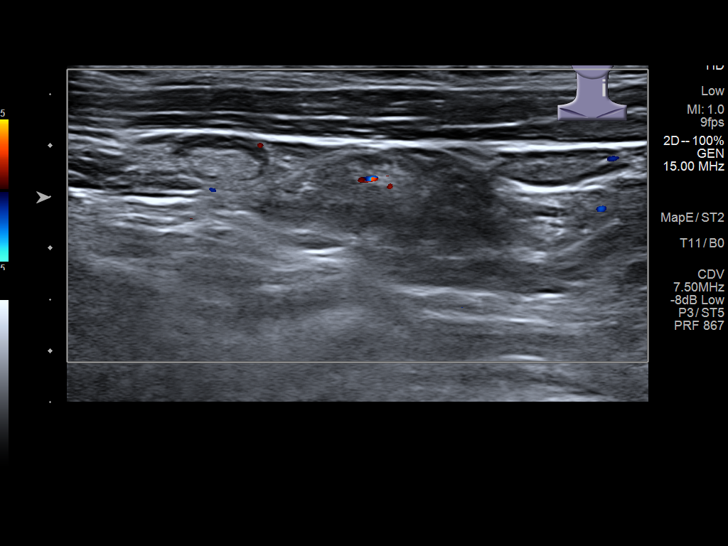
[im 40/40]
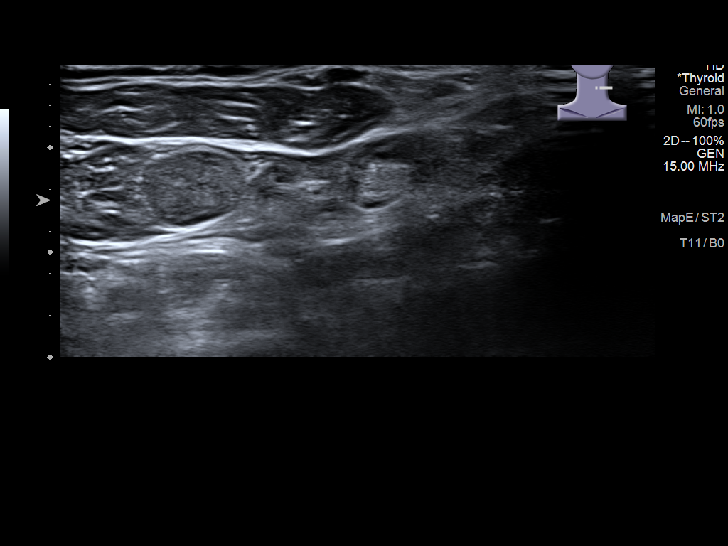

[13 of 25 positions shown; findings below may reference images not displayed]

FINDINGS: Parenchymal Echotexture: Mildly heterogeneous

Isthmus: 0.4 cm

Right lobe: 4.4 x 1.8 x 1.8 cm

Left lobe: 4.2 x 2.1 x 1.6 cm

_________________________________________________________

Estimated total number of nodules >/= 1 cm: 0

Number of spongiform nodules >/=  2 cm not described below (TR1): 0

Number of mixed cystic and solid nodules >/= 1.5 cm not described
below (TR2): 0

_________________________________________________________

No discrete nodules are seen within the thyroid gland.

Nonenlarged right neck lymph node is seen.

Borderline enlarged morphologically abnormal lymph node noted in the
left neck.
IMPRESSION: 1. No significant abnormality of the thyroid.
2. Borderline enlarged 1.2 x 0.9 x 0.9 cm, morphologically abnormal
lymph node noted in the left neck may be related to inflammation,
however more aggressive processes such is lymphoma/leukemia or
metastatic disease is not completely excluded. Further evaluation
with contrast enhanced soft tissue neck CT should be performed.
3. These results will be called to the ordering clinician or
representative by the Radiologist Assistant, and communication
documented in the PACS or [REDACTED].

The above is in keeping with the ACR TI-RADS recommendations - [HOSPITAL] [C3];[DATE].

## 2021-05-31 ENCOUNTER — Other Ambulatory Visit: Payer: Self-pay | Admitting: Unknown Physician Specialty

## 2021-05-31 ENCOUNTER — Telehealth: Payer: Self-pay | Admitting: *Deleted

## 2021-05-31 DIAGNOSIS — R221 Localized swelling, mass and lump, neck: Secondary | ICD-10-CM

## 2021-05-31 NOTE — Telephone Encounter (Signed)
Hi Dr. Gwenlyn Found, Jason Neal has upcoming biopsy of neck mass planned and is being asked to hold Aspirin for 3 days. He has a history of CAD with remote stenting to LCX and RCA. Last cath in 2013 showed patent stents with 40-50% mid AV groove CLX and proximal LAD stenosis. Also has a history of paroxysmal atrial fibrillation, HTN, HLD, and OSA. You last saw him in 06/2020 at which time he was doing well. Is it OK for him to hold his Aspirin for 3 days prior to this procedure? Please route response back to P CV DIV PREOP.  Pharmacy, can you also please comment on how long Xarelto can be held for the procedure?  Thank you both!

## 2021-05-31 NOTE — Telephone Encounter (Signed)
   [place] Pre-operative Risk Assessment    Patient Name: Jason Neal  DOB: Jan 23, 1951  MRN: [id number]   HEARTCARE STAFF: - Please ensure there is not already an duplicate clearance open for this procedure. - Under Visit Info/Reason for Call, type in Other and utilize the format Clearance MM/DD/YY or Clearance TBD. Do not use dashes or single digits. - If request is for dental extraction, please clarify the # of teeth to be extracted. - If the patient is currently at the dentist's office, call Pre-Op APP to address. If the patient is not currently in the dentist office, please route to the Pre-Op pool  Request for surgical clearance:  What type of surgery is being performed? ULTRASOUND GUIDED Bx OF LEFT NECK MASS  When is this surgery scheduled? TBD   What type of clearance is required (medical clearance vs. Pharmacy clearance to hold med vs. Both)? BOTH  Are there any medications that need to be held prior to surgery and how long? XARELTO x 1 DAYS PRIOR TO PROCEDURE AND ASA x 3 DAYS PRIOR TO PROCEDURE PER DR. Tami Ribas   Practice name and name of physician performing surgery? [facility]; DR. Tami Ribas   What is the office phone number? [phone]   7.   What is the office fax number? [phone]  8.   Anesthesia type (None, local, MAC, general) ? LOCAL   Jason Neal 05/31/2021, 12:32 PM  _________________________________________________________________   (provider comments below)

## 2021-06-01 NOTE — Telephone Encounter (Signed)
Patient with diagnosis of A Fib on Xarelto for anticoagulation.    Procedure: ULTRASOUND GUIDED Bx OF LEFT NECK MASS Date of procedure: TBD   CHA2DS2-VASc Score = 4  This indicates a 4.8% annual risk of stroke. The patient's score is based upon: CHF History: No HTN History: Yes Diabetes History: Yes Stroke History: No Vascular Disease History: Yes Age Score: 1 Gender Score: 0    CrCl 68 mL/min Platelet count 206 on 12/15/19   Per office protocol, patient can hold Xarelto for 1 dayprior to procedure.

## 2021-06-02 ENCOUNTER — Ambulatory Visit
Admission: RE | Admit: 2021-06-02 | Discharge: 2021-06-02 | Disposition: A | Discharge status: Home / Self Care | Payer: Medicare Other | Source: Ambulatory Visit | Attending: Unknown Physician Specialty | Admitting: Unknown Physician Specialty

## 2021-06-02 ENCOUNTER — Other Ambulatory Visit: Payer: Self-pay

## 2021-06-02 DIAGNOSIS — R221 Localized swelling, mass and lump, neck: Secondary | ICD-10-CM | POA: Diagnosis present

## 2021-06-02 IMAGING — CT CT NECK W/ CM
2 of 3 series · 8 of 14 positions shown, 10 images · IV contrast (omnipaque)
Comparison: Thyroid ultrasound [DATE]

CLINICAL DATA: Cough and sore throat. Left neck lymph node on
recent ultrasound

EXAM:
CT NECK WITH CONTRAST
TECHNIQUE: Multidetector CT imaging of the neck was performed using the
standard protocol following the bolus administration of intravenous
contrast.
CONTRAST:  60mL OMNIPAQUE IOHEXOL 300 MG/ML  SOLN

[Series 2: axial neck · axial · 0.63mm/px · z∈[-480,-356]mm · 3 of 125 slices shown]
[im 32/125  bone]
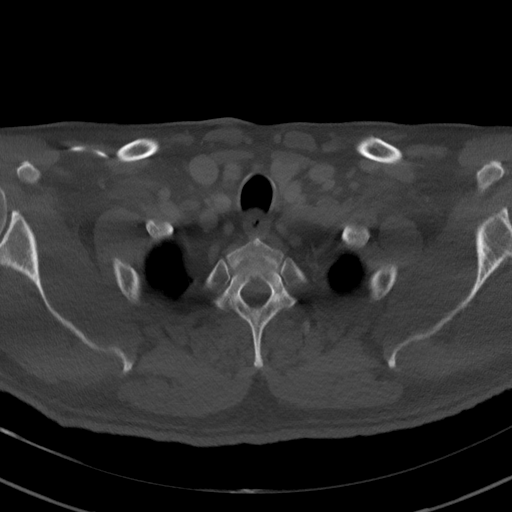
[im 63/125  bone]
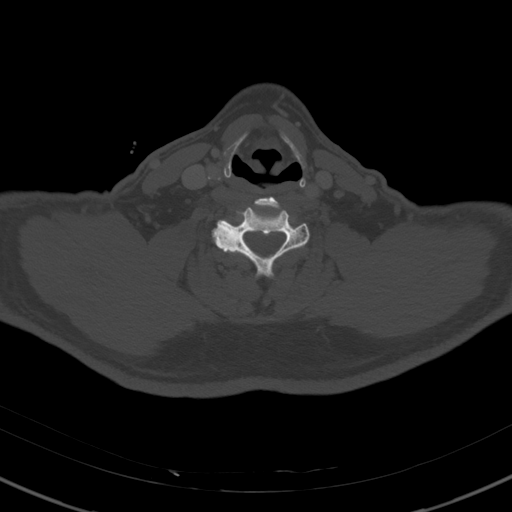
[im 94/125  bone]
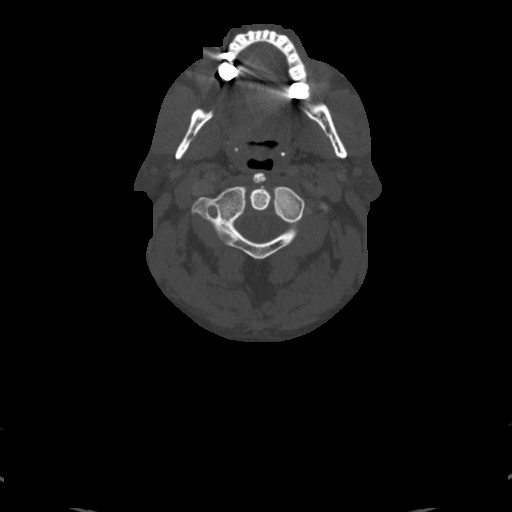

[Series 7: orthogonal ax · axial · 0.40mm/px · z∈[-568,-357]mm · 5 of 167 slices shown, 7 images]
[im 28/167  soft-tissue]
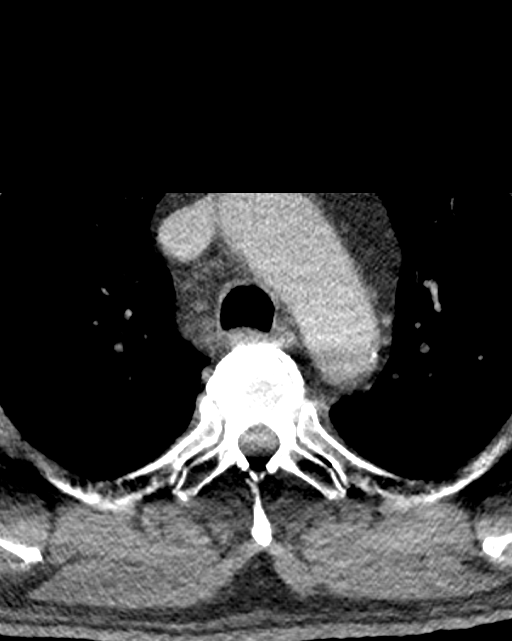
[im 28/167  bone]
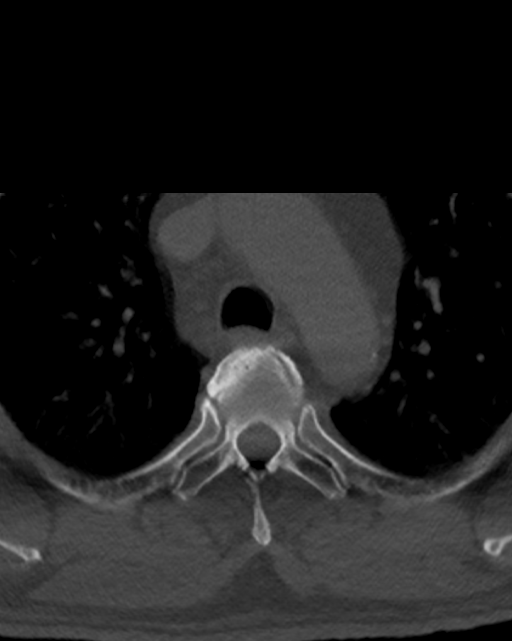
[im 56/167  bone]
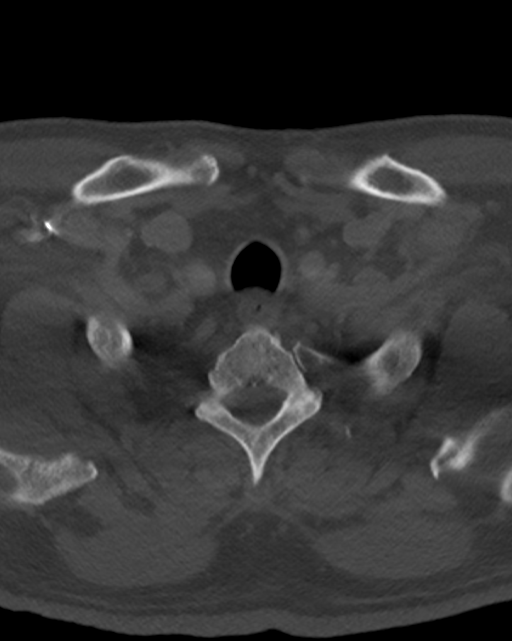
[im 84/167  bone]
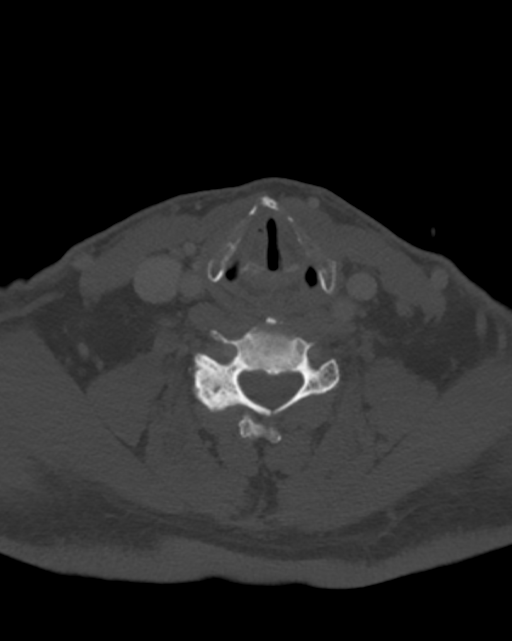
[im 111/167  bone]
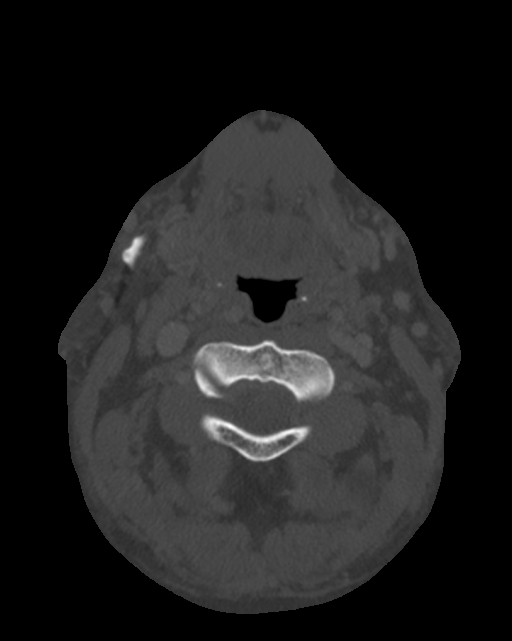
[im 139/167  soft-tissue]
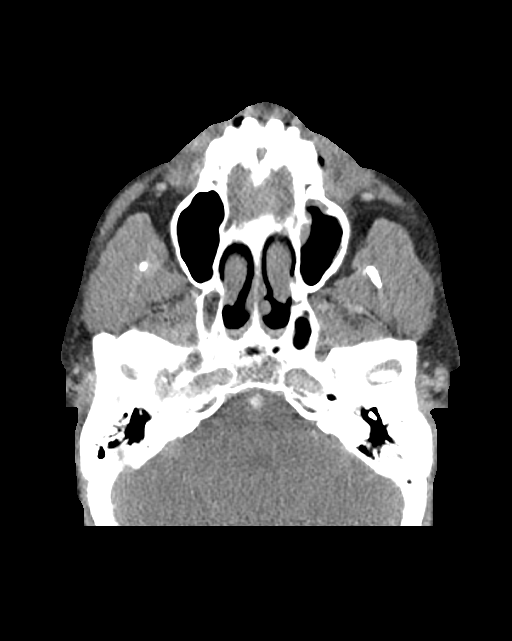
[im 139/167  bone]
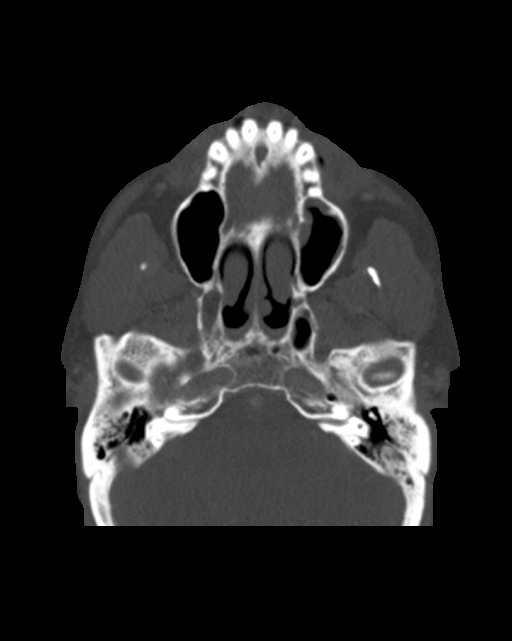

[8 of 14 positions shown; findings below may reference images not displayed]

FINDINGS: Pharynx and larynx: No mass or edema.  Bilateral tonsilliths.

Salivary glands: No inflammation, mass, or stone.

Thyroid: Normal thyroid volume.  No nodule.

Lymph nodes: No pathologic lymph nodes in the neck. There are
bilateral lymph nodes in the neck , slightly greater on the left
than the right.

Small lymph nodes deep to the sternocleidomastoid on the right
measuring up to 5 mm

On the left, level 2 lymph node 6 mm. Multiple lymph nodes deep to
the left sternocleidomastoid muscle measuring 5 mm, 6 mm and up to 8
mm on axial image 73. This may correspond to the ultrasound lymph
node identified. No nodal necrosis. Well-defined margins.

Vascular: Normal vascular enhancement.

Limited intracranial: Negative

Visualized orbits: Not imaged

Mastoids and visualized paranasal sinuses: Mild mucosal edema
paranasal sinuses. Mild mastoid effusion on the right.

Skeleton: Cervical spondylosis. Interbody fusion C6-7. No acute
skeletal abnormality.

Upper chest: Lung apices clear bilaterally

Other: None
IMPRESSION: Bilateral tonsilliths.  No pharyngeal mass

Small lymph nodes in the neck bilaterally left greater than right.
No pathologic lymph nodes. Largest lymph node in the left lower neck
deep to the sternocleidomastoid measures 8 mm in diameter without
nodal necrosis or perinodal stranding. These lymph nodes are most
likely reactive.

## 2021-06-02 MED ORDER — IOHEXOL 300 MG/ML  SOLN
75.0000 mL | Freq: Once | INTRAMUSCULAR | Status: AC | PRN
  Administered 2021-06-02: 60 mL via INTRAVENOUS

## 2021-06-03 NOTE — Telephone Encounter (Signed)
Primary Cardiologist:Jonathan Gwenlyn Found, MD  Chart reviewed as part of pre-operative protocol coverage. Because of Jason Fray Emberson's past medical history and time since last visit, he/she will require a follow-up visit in order to better assess preoperative cardiovascular risk.  Pre-op covering staff: - Please schedule appointment and call patient to inform them. - Please contact requesting surgeon's office via preferred method (i.e, phone, fax) to inform them of need for appointment prior to surgery.  If applicable, this message will also be routed to pharmacy pool and/or primary cardiologist for input on holding anticoagulant/antiplatelet agent as requested below so that this information is available at time of patient's appointment.   Deberah Pelton, NP  06/03/2021, 7:06 AM

## 2021-06-03 NOTE — Telephone Encounter (Signed)
I s/w the pt and advised he is going to need a pre op appt. Pt is agreeable and has been scheduled to see Laurann Montana, NP Monday 06/06/21 @ 9:15. Pt is grateful for all of our help and time. I will forward clearance notes to NP for upcoming appt. Will send FYI to surgeon's office pt has appt 06/06/21.

## 2021-06-06 ENCOUNTER — Ambulatory Visit (INDEPENDENT_AMBULATORY_CARE_PROVIDER_SITE_OTHER): Payer: Medicare Other | Admitting: Family

## 2021-06-06 ENCOUNTER — Telehealth: Payer: Self-pay | Admitting: Family

## 2021-06-06 ENCOUNTER — Ambulatory Visit: Payer: Medicare Other | Admitting: Family

## 2021-06-06 ENCOUNTER — Encounter: Payer: Self-pay | Admitting: Family

## 2021-06-06 ENCOUNTER — Other Ambulatory Visit: Payer: Self-pay

## 2021-06-06 VITALS — BP 128/78 | HR 72 | Ht 72.0 in | Wt 234.6 lb

## 2021-06-06 DIAGNOSIS — E785 Hyperlipidemia, unspecified: Secondary | ICD-10-CM | POA: Diagnosis not present

## 2021-06-06 DIAGNOSIS — Z7901 Long term (current) use of anticoagulants: Secondary | ICD-10-CM

## 2021-06-06 DIAGNOSIS — I25118 Atherosclerotic heart disease of native coronary artery with other forms of angina pectoris: Secondary | ICD-10-CM

## 2021-06-06 DIAGNOSIS — I480 Paroxysmal atrial fibrillation: Secondary | ICD-10-CM

## 2021-06-06 DIAGNOSIS — I350 Nonrheumatic aortic (valve) stenosis: Secondary | ICD-10-CM

## 2021-06-06 DIAGNOSIS — I10 Essential (primary) hypertension: Secondary | ICD-10-CM

## 2021-06-06 DIAGNOSIS — Z01810 Encounter for preprocedural cardiovascular examination: Secondary | ICD-10-CM

## 2021-06-06 NOTE — Patient Instructions (Addendum)
Medication Instructions:  Your physician has recommended you make the following change in your medication:   STOP Aspirin 81mg   *If you need a refill on your cardiac medications before your next appointment, please call your pharmacy*  Lab Work: None ordered today.   Testing/Procedures: Your EKG today shows normal sinus rhythm.   Follow-Up: At [facility], you and your health needs are our priority.  As part of our continuing mission to provide you with exceptional heart care, we have created designated Provider Care Teams.  These Care Teams include your primary Cardiologist (physician) and Advanced Practice Providers (APPs -  Physician Assistants and Nurse Practitioners) who all work together to provide you with the care you need, when you need it.  We recommend signing up for the patient portal called "MyChart".  Sign up information is provided on this After Visit Summary.  MyChart is used to connect with patients for Virtual Visits (Telemedicine).  Patients are able to view lab/test results, encounter notes, upcoming appointments, etc.  Non-urgent messages can be sent to your provider as well.   To learn more about what you can do with MyChart, go to [email or web address].    Your next appointment:   1 year  The format for your next appointment:   In Person  Provider:   You may see Quay Burow, MD or one of the following Advanced Practice Providers on your designated Care Team:   [place], PA-C Coletta Memos, FNP   Other Instructions  When you hit the "donut hole" and pay more than $85 dollars for Xarelto - you can register for [place] Select to get it at reduced price. [email or web address]  Heart Healthy Diet Recommendations: A low-salt diet is recommended. Meats should be grilled, baked, or boiled. Avoid fried foods. Focus on lean protein sources like fish or chicken with vegetables and fruits. The American Heart Association is a Educational psychologist!  American Heart Association Diet and Lifeystyle Recommendations   Exercise recommendations: The American Heart Association recommends 150 minutes of moderate intensity exercise weekly. Try 30 minutes of moderate intensity exercise 4-5 times per week. This could include walking, jogging, or swimming.  Icosapent ethyl capsules (Vascepa) What is this medication? ICOSAPENT ETHYL (eye KOE sa pent eth il) contains essential fats. It is used to treat high triglyceride levels. Diet and lifestyle changes are often used withthis drug. This medicine may be used for other purposes; ask your health care provider orpharmacist if you have questions. COMMON BRAND NAME(S): VASCEPA What should I tell my care team before I take this medication? They need to know if you have any of these conditions: bleeding disorders liver disease an unusual or allergic reaction to icosapent ethyl, fish, shellfish, other medicines, foods, dyes, or preservatives history of irregular heartbeat pregnant or trying to get pregnant breast-feeding How should I use this medication? Take this medicine by mouth with a glass of water. Follow the directions on the prescription label. Take this medicine with food. Do not cut, crush, dissolve, or chew this medicine. Take your medicine at regular intervals. Do not take itmore often than directed. Do not stop taking except on your doctor's advice. Talk to your pediatrician regarding the use of this medicine in children.Special care may be needed. Overdosage: If you think you have taken too much of this medicine contact apoison control center or emergency room at once. NOTE: This medicine is only for you. Do not share this medicine with others. What if I miss a dose? If you miss  a dose, take it as soon as you can. If it is almost time for yournext dose, take only that dose. Do not take double or extra doses. What may interact with this medication? This medicine may interact with  the following medications: aspirin and aspirin-like medicines beta-blockers like metoprolol and propranolol certain medicines that treat or prevent blood clots like warfarin, enoxaparin, dalteparin, apixaban, dabigatran, and rivaroxaban diuretics male hormones, like estrogens and birth control pills This list may not describe all possible interactions. Give your health care provider a list of all the medicines, herbs, non-prescription drugs, or dietary supplements you use. Also tell them if you smoke, drink alcohol, or use illegaldrugs. Some items may interact with your medicine. What should I watch for while using this medication? You may need blood work done while you are taking this medicine. Follow a good diet and exercise plan. Taking this medicine does not replace a healthy lifestyle. Some foods that have omega-3 fatty acids naturally are fatty fish like albacore tuna, halibut, herring, mackerel, lake trout, salmon, andsardines. If you are scheduled for any medical or dental procedure, tell your healthcare provider that you are taking this medicine. You may need to stop taking thismedicine before the procedure. What side effects may I notice from receiving this medication? Side effects that you should report to your doctor or health care professionalas soon as possible: allergic reactions like skin rash, itching or hives, swelling of the face, lips, or tongue breathing problems unusual bleeding or bruising fast, irregular heartbeat Side effects that usually do not require medical attention (report to yourdoctor or health care professional if they continue or are bothersome): muscle or joint pain sore throat swelling of the ankles, feet, hands constipation Gout This list may not describe all possible side effects. Call your doctor for medical advice about side effects. You may report side effects to FDA at1-800-FDA-1088. Where should I keep my medication? Keep out of the reach of  children. Store at room temperature between 15 and 30 degrees C (59 and 86 degrees F).Throw away any unused medicine after the expiration date. NOTE: This sheet is a summary. It may not cover all possible information. If you have questions about this medicine, talk to your doctor, pharmacist, orhealth care provider.  2022 Elsevier/Gold Standard (2018-11-26 18:35:46)

## 2021-06-06 NOTE — Telephone Encounter (Signed)
received call from [facility]. patient no longer needs clearance

## 2021-06-06 NOTE — Progress Notes (Signed)
Office Visit    Patient Name: Jason Neal Date of Encounter: 06/06/2021  PCP:  Kirk Ruths, MD   [place]  Cardiologist:  Quay Burow, MD  Advanced Practice Provider:  No care team member to display Electrophysiologist:  None    Chief Complaint    DOMINIE Neal is a 70 y.o. male with a hx of thyroid mass, coronary artery disease with stenting of circumflex and RCA, PAF on chronic anticoagulation, hypertension, hyperlipidemia, OSA on CPAP, mild aortic stenosis by echo 06/2020 presents today for preop clearance  Past Medical History    Past Medical History:  Diagnosis Date   Allergies    HAS ALBUTEROL INHALER   Aortic valve stenosis    mild   Brain bleed ([place]) 08/2018   DUE TO FALL AND HIT HEAD-BLEED RESOLVED ON ITS OWN   CAD (coronary artery disease)    prior CFX/RCA PCI 2011, cath OK 3/13   Cancer ([place]) 2017   MELANOMA RIGHT EAR   Carotid bruit 03/14/11   CAROTID DUPLEX DOPPLER-Right bulb; demonstrated a mild amount of fibrous plaque w/o evidence of a significant diameter reduction, dissection or any other vascular abnormality.;Left ICA;-proximal, mid and distal segments of the internal carotid artery normal patency.mid and distal segments demonstrating moderate tortuosity..Mildly abnormal carotid duplex scan.   Chronic renal insufficiency, stage III (moderate) (HCC)    DJD (degenerative joint disease)    c-spine   Dyslipidemia    Dysrhythmia    GERD (gastroesophageal reflux disease)    Heart murmur    History of kidney stones    X1   Hypercholesteremia    Hypertension    PAF (paroxysmal atrial fibrillation) ([place]) March 2013   Sinus headache    Sleep apnea    on C-Pap   Type 2 diabetes mellitus with chronic kidney disease (HCC)    diet controlled   Past Surgical History:  Procedure Laterality Date   C-spine surgery  2001   cardiac catherization  03/04/12   Dr Gwenlyn Found   CARDIOVERSION  03/07/2012   Procedure:  CARDIOVERSION;  Surgeon: Sanda Klein, MD;  Location: [place];  Service: Cardiovascular;  Laterality: N/A;   CATARACT EXTRACTION W/PHACO Right 06/25/2019   Procedure: CATARACT EXTRACTION PHACO AND INTRAOCULAR LENS PLACEMENT ([place]) RIGHT;  Surgeon: Leandrew Koyanagi, MD;  Location: [place];  Service: Ophthalmology;  Laterality: Right;  Diabetic - oral meds sleep apnea   CATARACT EXTRACTION W/PHACO Left 07/23/2019   Procedure: CATARACT EXTRACTION PHACO AND INTRAOCULAR LENS PLACEMENT ([place]) LEFT DIABETIC;  Surgeon: Leandrew Koyanagi, MD;  Location: [place];  Service: Ophthalmology;  Laterality: Left;  Diabetic - oral meds   COLONOSCOPY WITH PROPOFOL N/A 01/05/2017   Procedure: COLONOSCOPY WITH PROPOFOL;  Surgeon: Lollie Sails, MD;  Location: [facility] ENDOSCOPY;  Service: Endoscopy;  Laterality: N/A;   CORONARY ANGIOPLASTY WITH STENT PLACEMENT  Dec 2011   [facility]   HERNIA REPAIR  1999   INGUINAL HERNIA REPAIR Left 12/17/2019   Procedure: HERNIA REPAIR INGUINAL ADULT;  Surgeon: Robert Bellow, MD;  Location: [facility];  Service: General;  Laterality: Left;   LEFT HEART CATHETERIZATION WITH CORONARY ANGIOGRAM N/A 03/04/2012   Procedure: LEFT HEART CATHETERIZATION WITH CORONARY ANGIOGRAM;  Surgeon: Lorretta Harp, MD;  Location: [facility] CATH LAB;  Service: Cardiovascular;  Laterality: N/A;   TEE WITHOUT CARDIOVERSION  03/07/2012   Procedure: TRANSESOPHAGEAL ECHOCARDIOGRAM (TEE);  Surgeon: Sanda Klein, MD;  Location: [place];  Service: Cardiovascular;  Laterality:  N/A;    Allergies  Allergies  Allergen Reactions   Angiotensin Receptor Blockers Cough    (06/18/19 pt denies)   Demeclocycline Other (See Comments)    AS ACHILD    History of Present Illness    Jason Neal is a 70 y.o. male with a hx of thyroid mass, coronary artery disease with stenting of circumflex and RCA, PAF on chronic anticoagulation, hypertension, hyperlipidemia, OSA on CPAP, mild  aortic stenosis by echo 06/2020 last seen 07/02/2020 by Dr. Gwenlyn Found.  Previous stenting of his circumflex and RCA.  Last cardiac catheterization 03/01/2012 revealing patent stent with 40 to 50% mid AV groove circumflex and proximal LAD stenosis with normal LV function.  Most recent echocardiogram 06/28/2020 LVEF 60 to 65%, mild concentric LVH, grade 1 diastolic dysfunction, no R WMA, RV normal size and function, normal PASP, trivial MR, mild aortic valve stenosis mean gradient 14 mmHg and V-max 2.54 m/s.  He has upcoming ultrasound-guided biopsy of the left neck mass on undetermined date.  Given CHA2DS2-VASc score of 4, creatinine clearance of 68 mL/min and platelet count of 206 he was recommended to hold Xarelto for 1 day prior to procedure.  Presents today for follow-up. He keeps a very large garden and exercises regularly taking care of this. 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 Notes some chest wall pain that is  tender on palpation which he attributes to musculoskeletal pain. No exertional chest discomfort. Notes no shortness of breat at rest nor dyspnea on exertion. Reports no shortness of breath nor dyspnea on exertion. No edema, orthopnea, PND. Reports no palpitations. Does note occasional nose bleed on Xarelto and Aspirin. No hematuria nor melena. His son is a Emergency planning/management officer and suffered electical shock by downed powerline in May and is still recuperating in the ICU at [facility] which has been understandably stressful, tells me his diet has been not as careful as usual.   EKGs/Labs/Other Studies Reviewed:   The following studies were reviewed today:  EKG:  EKG is  ordered today.  The ekg ordered today demonstrates NSR 72 bpm with no acute ST/T wave changes.   Recent Labs: No results found for requested labs within last 8760 hours.  Recent Lipid Panel    Component Value Date/Time   CHOL 179 03/05/2012 0538   TRIG 483 (H) 03/05/2012 0538   HDL 33  (L) 03/05/2012 0538   CHOLHDL 5.4 03/05/2012 0538   VLDL UNABLE TO CALCULATE IF TRIGLYCERIDE OVER 400 mg/dL 03/05/2012 0538   LDLCALC UNABLE TO CALCULATE IF TRIGLYCERIDE OVER 400 mg/dL 03/05/2012 0538      Home Medications   Current Meds  Medication Sig   ACCU-CHEK AVIVA PLUS test strip    albuterol (VENTOLIN HFA) 108 (90 Base) MCG/ACT inhaler Inhale 1-2 puffs into the lungs every 6 (six) hours as needed for wheezing or shortness of breath.   atorvastatin (LIPITOR) 40 MG tablet Take 40 mg by mouth every evening.    B Complex-C (B-COMPLEX WITH VITAMIN C) tablet Take 1 tablet by mouth every other day.    carvedilol (COREG) 6.25 MG tablet Take 6.25 mg by mouth 2 (two) times daily.    cetirizine (ZYRTEC) 10 MG tablet Take 10 mg by mouth every evening.   cholecalciferol (VITAMIN D) 1000 UNITS tablet Take 1,000-2,000 Units by mouth See admin instructions. Take 1000 units in the morning and 2000 units in the evening   diltiazem (CARDIZEM CD) 180 MG  24 hr capsule Take 180 mg by mouth every morning.    fluticasone (FLONASE) 50 MCG/ACT nasal spray Place 1 spray into both nostrils daily as needed for allergies.    glipiZIDE-metformin (METAGLIP) 2.5-500 MG tablet Take 1 tablet by mouth 2 (two) times daily before a meal.   hydrALAZINE (APRESOLINE) 50 MG tablet Take 50 mg by mouth 2 (two) times daily.   ipratropium (ATROVENT) 0.06 % nasal spray Place 2 sprays into both nostrils 3 (three) times daily as needed for rhinitis.   Lancets (ACCU-CHEK MULTICLIX) lancets    losartan (COZAAR) 100 MG tablet TAKE 1 TABLET DAILY (Patient taking differently: Take 100 mg by mouth every morning.)   Multiple Vitamin (MULTIVITAMIN WITH MINERALS) TABS tablet Take 1 tablet by mouth every other day.   nitroGLYCERIN (NITROSTAT) 0.4 MG SL tablet Place 0.4 mg under the tongue every 5 (five) minutes as needed for chest pain.   ofloxacin (FLOXIN) 0.3 % OTIC solution Place 5 drops into both ears as needed.   Omega-3 Fatty Acids  (FISH OIL) 1200 MG CAPS Take 1,200 mg by mouth daily.   pantoprazole (PROTONIX) 40 MG tablet Take 40 mg by mouth every evening.    Polyethyl Glycol-Propyl Glycol 0.4-0.3 % SOLN Place 1 drop into both eyes as needed (dry eyes).    polyethylene glycol (MIRALAX / GLYCOLAX) 17 g packet Take 17 g by mouth daily as needed.    rivaroxaban (XARELTO) 20 MG TABS tablet Take 1 tablet (20 mg total) by mouth daily with supper.   sodium chloride (OCEAN) 0.65 % SOLN nasal spray Place 2 sprays into both nostrils 2 (two) times daily.   [DISCONTINUED] aspirin EC 81 MG tablet Take 1 tablet (81 mg total) by mouth daily.     Review of Systems   All other systems reviewed and are otherwise negative except as noted above.  Physical Exam    VS:  BP 128/78 (BP Location: Left Arm, Patient Position: Sitting, Cuff Size: Normal)   Pulse 72   Ht 6' (1.829 m)   Wt 234 lb 9.6 oz (106.4 kg)   SpO2 97%   BMI 31.82 kg/m  , BMI Body mass index is 31.82 kg/m.  Wt Readings from Last 3 Encounters:  06/06/21 234 lb 9.6 oz (106.4 kg)  07/02/20 (!) 236 lb 9.6 oz (107.3 kg)  12/17/19 240 lb (108.9 kg)     GEN: Well nourished, overweight, well developed, in no acute distress. HEENT: normal. Neck: Supple, no JVD, carotid bruits, or masses. Cardiac: RRR, Gr 2/6 systolic murmur,  no  rubs, or gallops. No clubbing, cyanosis, edema.  Radials/PT 2+ and equal bilaterally.  Respiratory:  Respirations regular and unlabored, clear to auscultation bilaterally. GI: Soft, nontender, nondistended. MS: No deformity or atrophy. Skin: Warm and dry, no rash. Neuro:  Strength and sensation are intact. Psych: Normal affect.  Assessment & Plan    Preoperative cardiovascular clearance-Per pharmacy review may hold Xarelto 1 day prior to planned procedure.  We are discontinuing aspirin today so he will not need to hold. According to the Revised Cardiac Risk Index (RCRI), his Perioperative Risk of Major Cardiac Event is (%): 6.6. His  Functional Capacity in METs is: 6.28 according to the Duke Activity Status Index (DASI).  He is deemed acceptable risk for the planned procedure without additional cardiovascular intervention.  We will forward to requesting party via epic fax function.  Costochondiritis - Notes chest pain that is tender on palpation. Encourage heat pack or tylenol. Chest wall tender  on palpation, atypical for angina.   CAD- Stable with no anginal symptoms. No indication for ischemic evaluation. GDMT includes carvedilol, atorvastatin, PRN nitroglycerin.  Discontinue aspirin today as he is on chronic anticoagulation and reports nosebleeds. Heart healthy diet and regular cardiovascular exercise encouraged.   HLD, LDL goal less than 70- 01/2021 LDL 48. 05/26/21 total cholesterol 170, triglycerides 517, HDL 36.8.  He reports his diet has been not as careful recently and his son has been a hospital.  Offered my condolences.  Continue atorvastatin 40 mg daily.  We discussed the addition of Vascepa though he is hesitant and wishes to work on diet first.  Provided information regarding Vascepa and encouraged to discuss with his primary care provider who plans to recheck his lipid panel in 3 months.  Vascepa would lower triglycerides as well as provide cardioprotective benefit.  HTN- BP well controlled. Continue current antihypertensive regimen.   PAF/chronic anticoagulation- Denies palpitations.  EKG today shows normal sinus rhythm.  Continue diltiazem 180 mg daily.  No bleeding complications on Xarelto 20mg  daily.  Provided information for how to register for [place] select program when he hits the "donut hole".   Mild aortic stenosis by echo 06/2020 -repeat echocardiogram for monitoring scheduled for 07/19/2020. Continue optimal BP and volume control. Echo does not need to be completed prior to biopsy.   DM2 - Most recent A1c 7.3. Continue to follow with PCP.  Disposition: Follow up  in August as scheduled  with Dr.  Gwenlyn Found  Signed, Loel Dubonnet, NP 06/06/2021, 10:02 AM [place]

## 2021-07-18 ENCOUNTER — Ambulatory Visit
Admission: RE | Admit: 2021-07-18 | Discharge: 2021-07-18 | Disposition: A | Discharge status: Home / Self Care | Payer: Medicare Other | Source: Ambulatory Visit | Attending: Unknown Physician Specialty | Admitting: Unknown Physician Specialty

## 2021-07-18 ENCOUNTER — Other Ambulatory Visit: Payer: Self-pay | Admitting: Unknown Physician Specialty

## 2021-07-18 DIAGNOSIS — J329 Chronic sinusitis, unspecified: Secondary | ICD-10-CM

## 2021-07-18 IMAGING — CR DG SINUSES COMPLETE 3+V
5 series · 5 of 5 positions shown · non-contrast
Comparison: None.

CLINICAL DATA: Chronic sinusitis.  Frothy drainage from nose.

EXAM:
PARANASAL SINUSES - COMPLETE 3 + VIEW

[pns [place]]
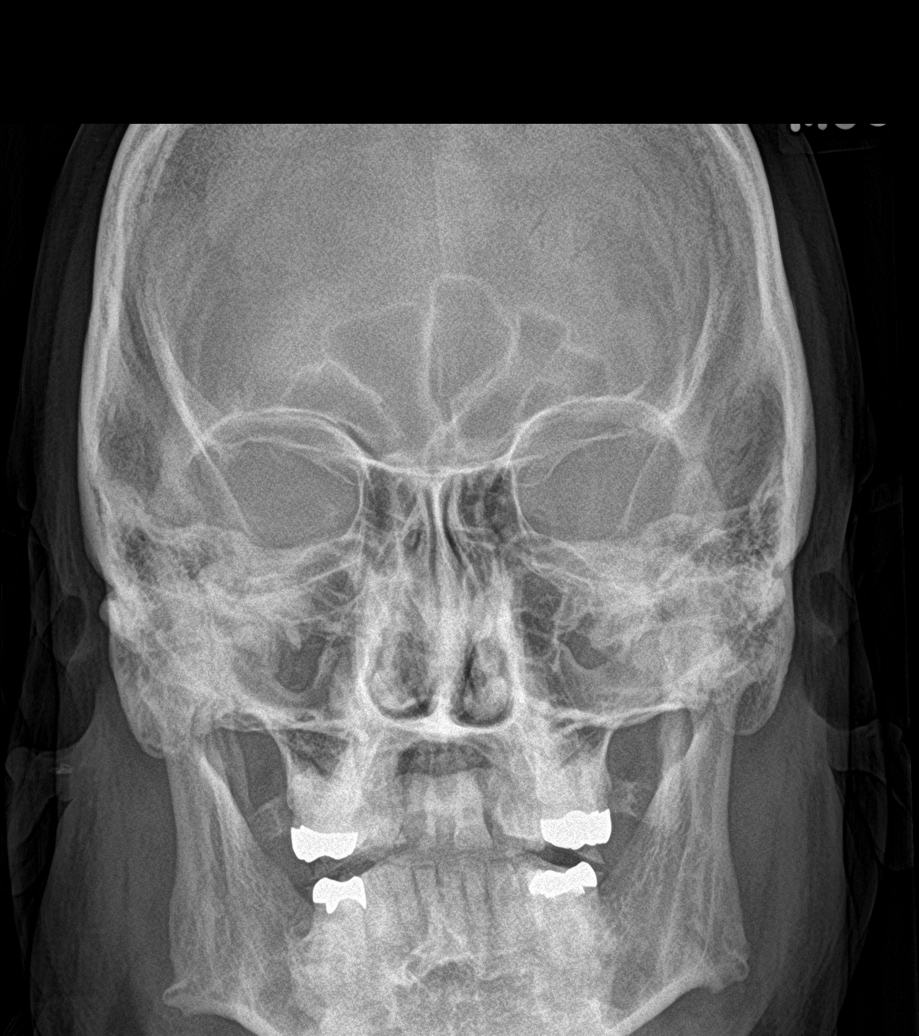

[pns waters]
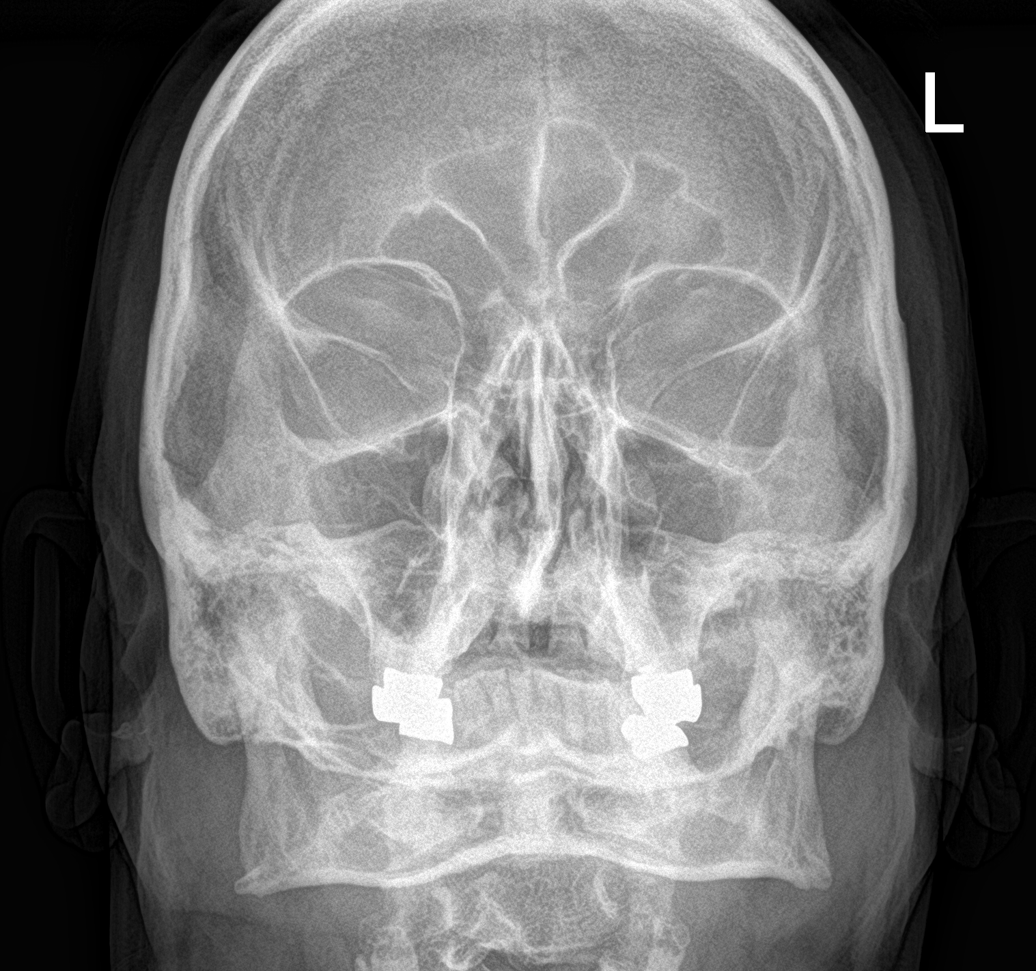

[pns lat]
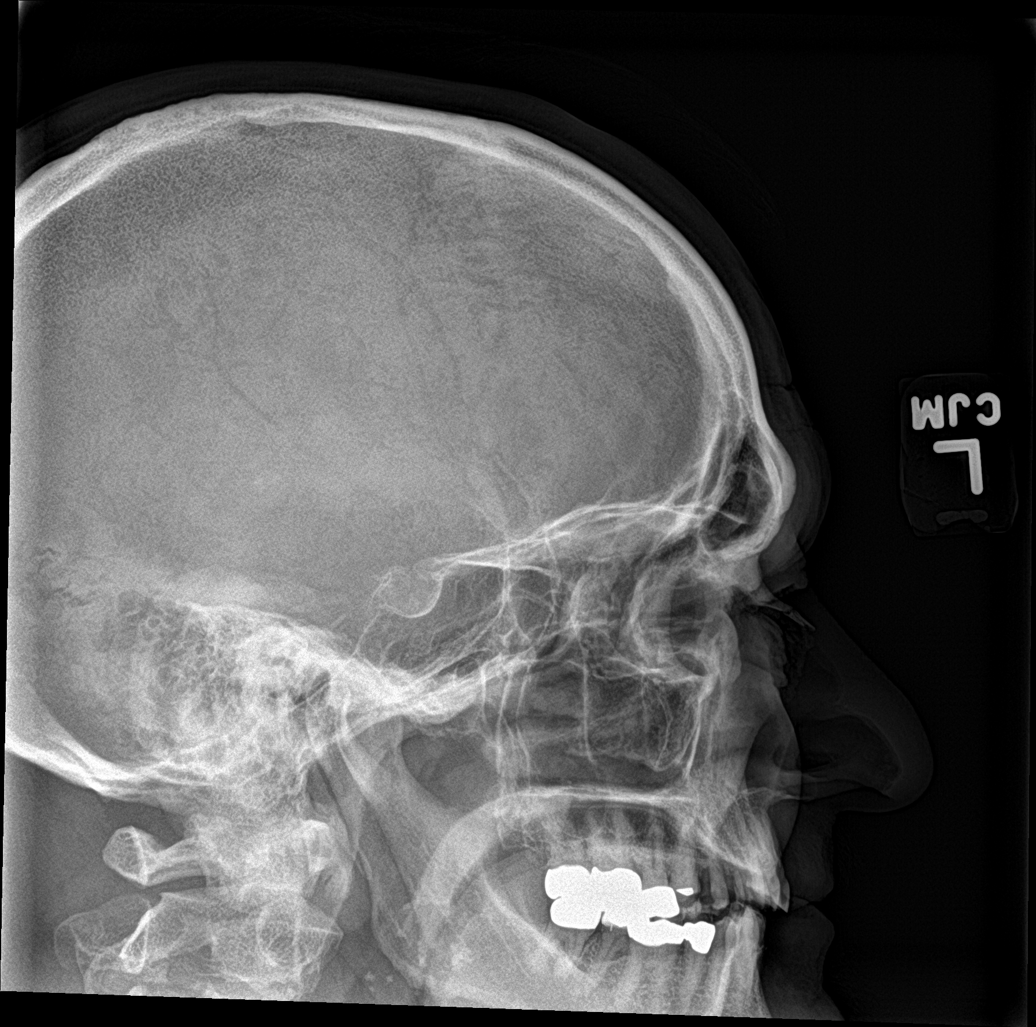

[facial smv (1 of 2)]
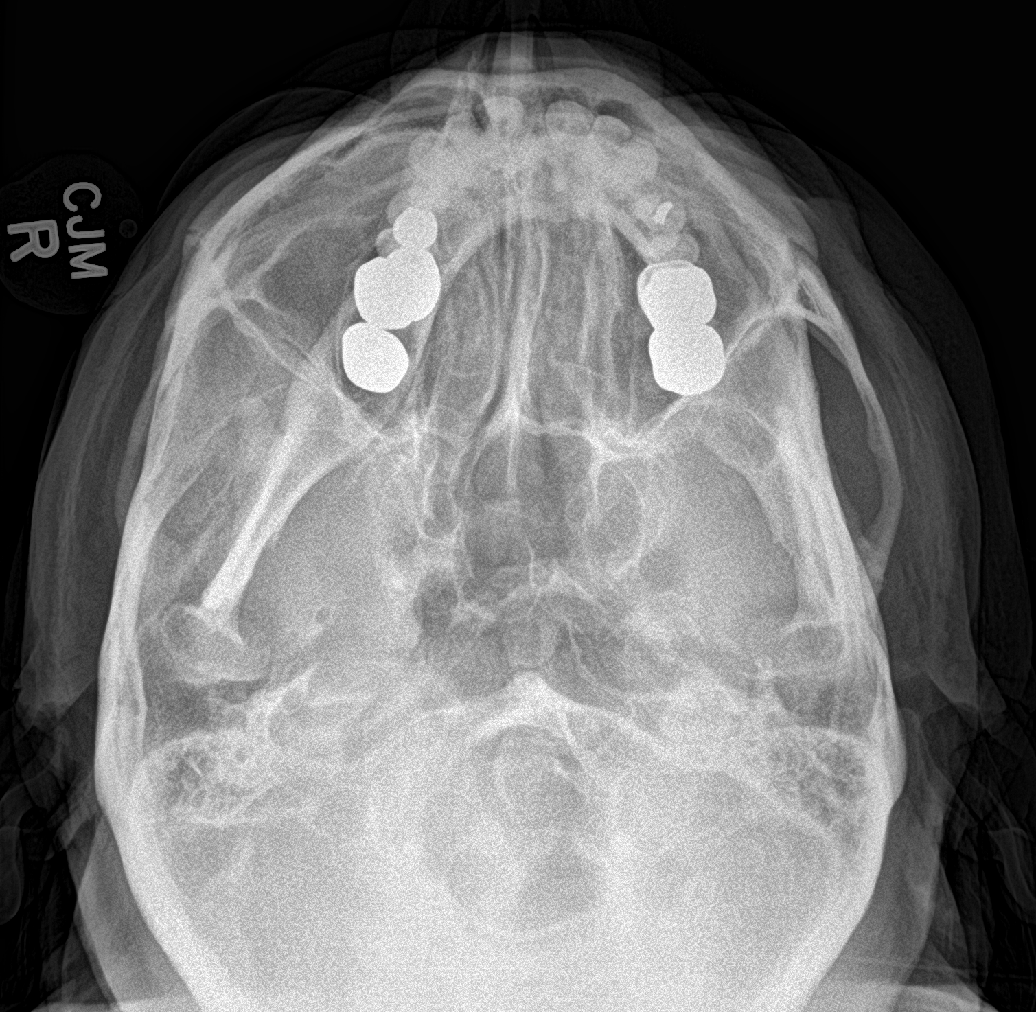

[facial smv (2 of 2)]
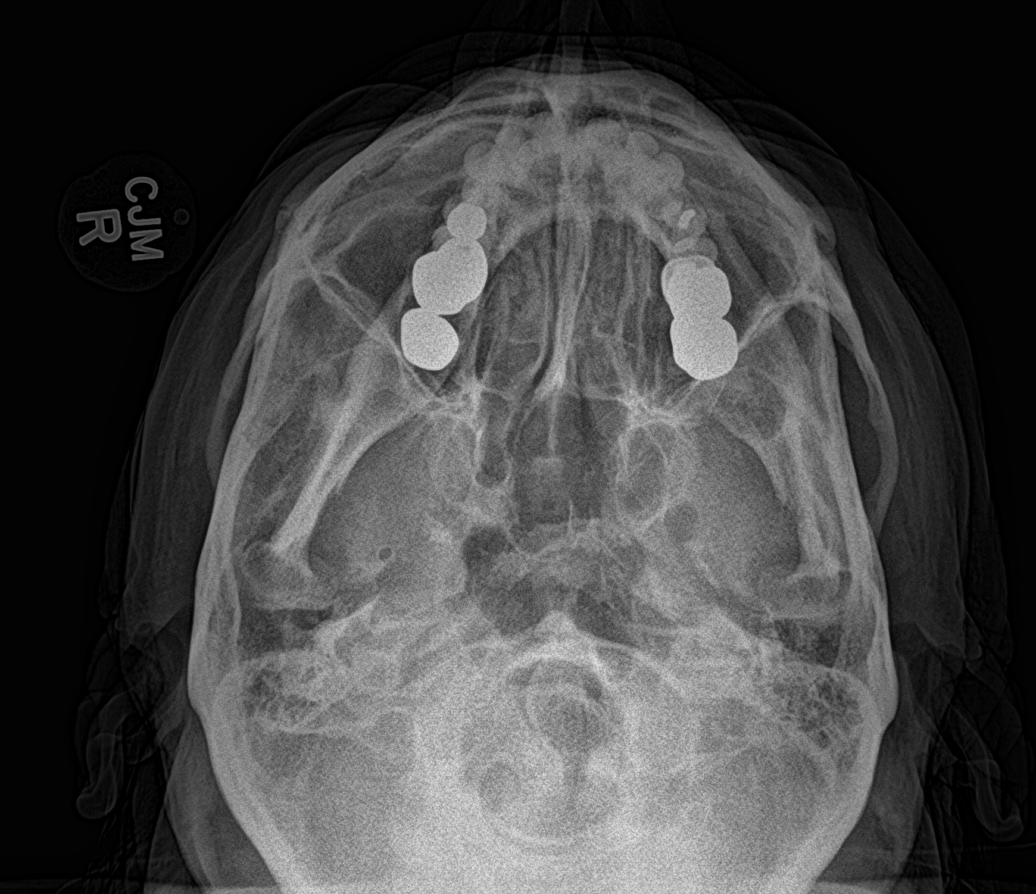

[5 of 5 positions shown; findings below may reference images not displayed]

FINDINGS: The paranasal sinus are aerated. There is no evidence of sinus
opacification air-fluid levels or mucosal thickening. No significant
bone abnormalities are seen.
IMPRESSION: Sinuses appear clear by radiography.

## 2021-07-19 ENCOUNTER — Other Ambulatory Visit: Payer: Self-pay

## 2021-07-19 ENCOUNTER — Ambulatory Visit (HOSPITAL_COMMUNITY): Payer: Medicare Other | Attending: Internal Medicine

## 2021-07-19 DIAGNOSIS — I350 Nonrheumatic aortic (valve) stenosis: Secondary | ICD-10-CM

## 2021-07-19 LAB — ECHOCARDIOGRAM COMPLETE
AR max vel: 1.97 cm2
AV Area VTI: 2.19 cm2
AV Area mean vel: 1.94 cm2
AV Mean grad: 15 mmHg
AV Peak grad: 27.7 mmHg
Ao pk vel: 2.63 m/s
Area-P 1/2: 2.76 cm2
S' Lateral: 2.85 cm

## 2021-07-20 ENCOUNTER — Other Ambulatory Visit: Payer: Self-pay | Admitting: *Deleted

## 2021-07-20 DIAGNOSIS — I350 Nonrheumatic aortic (valve) stenosis: Secondary | ICD-10-CM

## 2021-07-22 ENCOUNTER — Encounter: Payer: Self-pay | Admitting: *Deleted

## 2021-08-02 ENCOUNTER — Encounter: Payer: Self-pay | Admitting: Cardiovascular Disease

## 2021-08-02 ENCOUNTER — Other Ambulatory Visit: Payer: Self-pay

## 2021-08-02 ENCOUNTER — Ambulatory Visit (INDEPENDENT_AMBULATORY_CARE_PROVIDER_SITE_OTHER): Payer: Medicare Other | Admitting: Cardiovascular Disease

## 2021-08-02 DIAGNOSIS — G4733 Obstructive sleep apnea (adult) (pediatric): Secondary | ICD-10-CM | POA: Diagnosis not present

## 2021-08-02 DIAGNOSIS — I10 Essential (primary) hypertension: Secondary | ICD-10-CM

## 2021-08-02 DIAGNOSIS — E785 Hyperlipidemia, unspecified: Secondary | ICD-10-CM | POA: Diagnosis not present

## 2021-08-02 DIAGNOSIS — I350 Nonrheumatic aortic (valve) stenosis: Secondary | ICD-10-CM

## 2021-08-02 DIAGNOSIS — I2510 Atherosclerotic heart disease of native coronary artery without angina pectoris: Secondary | ICD-10-CM

## 2021-08-02 DIAGNOSIS — I712 Thoracic aortic aneurysm, without rupture, unspecified: Secondary | ICD-10-CM

## 2021-08-02 NOTE — Assessment & Plan Note (Signed)
Small thoracic aortic aneurysm measuring 45 mm by recent 2D echo performed 07/19/2021.  This will be repeated on an annual basis.

## 2021-08-02 NOTE — Assessment & Plan Note (Signed)
History of PAF status post successful cardioversion in the past currently on Xarelto oral anticoagulation maintaining sinus rhythm.

## 2021-08-02 NOTE — Assessment & Plan Note (Signed)
History of dyslipidemia and hypertriglyceridemia on atorvastatin followed by his PCP.

## 2021-08-02 NOTE — Assessment & Plan Note (Signed)
History of essential hypertension a blood pressure measured today at 140/76.  He is on diltiazem, hydralazine, carvedilol and losartan.

## 2021-08-02 NOTE — Assessment & Plan Note (Signed)
History of mild aortic stenosis which has remained stable by 2D echo recently performed 07/19/2021.  His valve area measured 2.2 cm with a mean gradient of 15.  He does have an outflow tract murmur.

## 2021-08-02 NOTE — Assessment & Plan Note (Signed)
History of CAD status post circumflex and RCA PCI drug-eluting stenting December 2011.  Last cath performed 03/02/2012 revealed widely patent stents with a 40 to 50% mid AV groove circumflex stenosis and proximal LAD stenosis normal LV function.  He denies chest pain or shortness of breath.

## 2021-08-02 NOTE — Progress Notes (Signed)
08/02/2021 Jason Neal   07-04-1951  EP:[id number]  Primary Physician Kirk Ruths, MD Primary Cardiologist: Lorretta Harp MD [name], [name]  HPI:  Jason Neal is a 70 y.o.    mildly overweight fit appearing married Caucasian male father of 2, grandfather of 3 grandchildren whose wife was also a patient of mine.[name] He was  taking care of his mother who has dementia who unfortunately passed away 2018-01-18.  His mother-in-law passed away 06-19-19 as well.. I last saw him in the office  07/02/2020.[name] His daughter is a Publishing copy at the [employer].  He has a history of CAD with stenting of his circumflex and RCA in the past. He was last cathed March 02, 2012 revealing patent stents with 40-50% mid AV groove circumflex and proximal LAD stenosis with normal LV function. His other problems include history of PAF with RVR, cardioverted to sinus rhythm on Xarelto. He has hypertension, hyperlipidemia and obstructive sleep apnea on CPAP. 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   Since I saw him a year ago he has done well.  His brother who is 35 years old recently passed away in 12/18/2020 from a stroke.  His son who was a Building surveyor man unfortunately was electrocuted by the [employer] in May of this year and spent several weeks at [facility] but is slowly recovering.  He is very active on his farm.  He denies chest pain or shortness of breath.   Current Meds  Medication Sig   ACCU-CHEK AVIVA PLUS test strip    albuterol (VENTOLIN HFA) 108 (90 Base) MCG/ACT inhaler Inhale 1-2 puffs into the lungs every 6 (six) hours as needed for wheezing or shortness of breath.   atorvastatin (LIPITOR) 40 MG tablet Take 40 mg by mouth every evening.    B  Complex-C (B-COMPLEX WITH VITAMIN C) tablet Take 1 tablet by mouth every other day.    carvedilol (COREG) 6.25 MG tablet Take 6.25 mg by mouth 2 (two) times daily.    cetirizine (ZYRTEC) 10 MG tablet Take 10 mg by mouth every evening.   cholecalciferol (VITAMIN D) 1000 UNITS tablet Take 1,000-2,000 Units by mouth See admin instructions. Take 1000 units in the morning and 2000 units in the evening   diltiazem (CARDIZEM CD) 180 MG 24 hr capsule Take 180 mg by mouth every morning.    fluticasone (FLONASE) 50 MCG/ACT nasal spray Place 1 spray into both nostrils daily as needed for allergies.    glipiZIDE-metformin (METAGLIP) 2.5-500 MG tablet Take 1 tablet by mouth 2 (two) times daily before a meal.   hydrALAZINE (APRESOLINE) 50 MG tablet Take 50 mg by mouth 2 (two) times daily.   ipratropium (ATROVENT) 0.06 % nasal spray Place 2 sprays into both nostrils 3 (three) times daily as needed for rhinitis.   Lancets (ACCU-CHEK MULTICLIX) lancets    losartan (COZAAR) 100 MG tablet TAKE 1 TABLET DAILY (Patient taking differently: Take 100 mg by mouth every morning.)   Multiple Vitamin (MULTIVITAMIN WITH MINERALS) TABS tablet Take 1 tablet by mouth every other day.   nitroGLYCERIN (NITROSTAT) 0.4 MG SL tablet Place 0.4 mg under the tongue every 5 (five) minutes as needed for chest pain.  ofloxacin (FLOXIN) 0.3 % OTIC solution Place 5 drops into both ears as needed.   Omega-3 Fatty Acids (FISH OIL) 1200 MG CAPS Take 1,200 mg by mouth daily.   pantoprazole (PROTONIX) 40 MG tablet Take 40 mg by mouth every evening.    Polyethyl Glycol-Propyl Glycol 0.4-0.3 % SOLN Place 1 drop into both eyes as needed (dry eyes).    polyethylene glycol (MIRALAX / GLYCOLAX) 17 g packet Take 17 g by mouth daily as needed.    rivaroxaban (XARELTO) 20 MG TABS tablet Take 1 tablet (20 mg total) by mouth daily with supper.   sodium chloride (OCEAN) 0.65 % SOLN nasal spray Place 2 sprays into both nostrils 2 (two) times daily.      Allergies  Allergen Reactions   Angiotensin Receptor Blockers Cough    (06/18/19 pt denies)   Demeclocycline Other (See Comments)    AS ACHILD    Social History   Socioeconomic History   Marital status: Married    Spouse name: Not on file   Number of children: Not on file   Years of education: Not on file   Highest education level: Not on file  Occupational History   Not on file  Tobacco Use   Smoking status: Never   Smokeless tobacco: Never  Vaping Use   Vaping Use: Never used  Substance and Sexual Activity   Alcohol use: No   Drug use: Never   Sexual activity: Not on file  Other Topics Concern   Not on file  Social History Narrative   Not on file   Social Determinants of Health   Financial Resource Strain: Not on file  Food Insecurity: Not on file  Transportation Needs: Not on file  Physical Activity: Not on file  Stress: Not on file  Social Connections: Not on file  Intimate Partner Violence: Not on file     Review of Systems: General: negative for chills, fever, night sweats or weight changes.  Cardiovascular: negative for chest pain, dyspnea on exertion, edema, orthopnea, palpitations, paroxysmal nocturnal dyspnea or shortness of breath Dermatological: negative for rash Respiratory: negative for cough or wheezing Urologic: negative for hematuria Abdominal: negative for nausea, vomiting, diarrhea, bright red blood per rectum, melena, or hematemesis Neurologic: negative for visual changes, syncope, or dizziness All other systems reviewed and are otherwise negative except as noted above.    Blood pressure 140/76, pulse 67, height 6' (1.829 m), weight 238 lb (108 kg).  General appearance: alert and no distress Neck: no adenopathy, no JVD, supple, symmetrical, trachea midline, thyroid not enlarged, symmetric, no tenderness/mass/nodules, and bilateral carotid bruits versus transmitted murmur Lungs: clear to auscultation bilaterally Heart: 2/6 outflow tract  murmur consistent with aortic stenosis Extremities: extremities normal, atraumatic, no cyanosis or edema Pulses: 2+ and symmetric Skin: Skin color, texture, turgor normal. No rashes or lesions Neurologic: Grossly normal  EKG not performed today  ASSESSMENT AND PLAN:   PAF successful cardioversion  03/07/12 to SR/SB History of PAF status post successful cardioversion in the past currently on Xarelto oral anticoagulation maintaining sinus rhythm.  CAD, prior CFX/RCA DES Dec 2011, cath 03/04/12  patent stents- stable CAD History of CAD status post circumflex and RCA PCI drug-eluting stenting December 2011.  Last cath performed 03/02/2012 revealed widely patent stents with a 40 to 50% mid AV groove circumflex stenosis and proximal LAD stenosis normal LV function.  He denies chest pain or shortness of breath.  Dyslipidemia History of dyslipidemia and hypertriglyceridemia on atorvastatin followed by his  PCP.  HTN (hypertension) History of essential hypertension a blood pressure measured today at 140/76.  He is on diltiazem, hydralazine, carvedilol and losartan.  Sleep apnea, on C-Pap History of obstructive sleep apnea on CPAP  Aortic stenosis, mild History of mild aortic stenosis which has remained stable by 2D echo recently performed 07/19/2021.  His valve area measured 2.2 cm with a mean gradient of 15.  He does have an outflow tract murmur.  Thoracic aortic aneurysm (HCC) Small thoracic aortic aneurysm measuring 45 mm by recent 2D echo performed 07/19/2021.  This will be repeated on an annual basis.      Lorretta Harp MD FACP,FACC,FAHA, [facility] 08/02/2021 8:16 AM

## 2021-08-02 NOTE — Patient Instructions (Signed)
Medication Instructions:  The current medical regimen is effective;  continue present plan and medications.  *If you need a refill on your cardiac medications before your next appointment, please call your pharmacy*   Testing/Procedures: Echocardiogram (1 year, before follow up) - Your physician has requested that you have an echocardiogram. Echocardiography is a painless test that uses sound waves to create images of your heart. It provides your doctor with information about the size and shape of your heart and how well your heart's chambers and valves are working. This procedure takes approximately one hour. There are no restrictions for this procedure. This will be performed at our [facility] location - [address].    Follow-Up: At [facility], you and your health needs are our priority.  As part of our continuing mission to provide you with exceptional heart care, we have created designated Provider Care Teams.  These Care Teams include your primary Cardiologist (physician) and Advanced Practice Providers (APPs -  Physician Assistants and Nurse Practitioners) who all work together to provide you with the care you need, when you need it.  We recommend signing up for the patient portal called "MyChart".  Sign up information is provided on this After Visit Summary.  MyChart is used to connect with patients for Virtual Visits (Telemedicine).  Patients are able to view lab/test results, encounter notes, upcoming appointments, etc.  Non-urgent messages can be sent to your provider as well.   To learn more about what you can do with MyChart, go to [email or web address].    Your next appointment:   12 month(s)  The format for your next appointment:   In Person  Provider:   Quay Burow, MD

## 2021-08-02 NOTE — Assessment & Plan Note (Signed)
History of obstructive sleep apnea on CPAP. 

## 2022-04-06 ENCOUNTER — Other Ambulatory Visit: Payer: Self-pay

## 2022-04-06 ENCOUNTER — Inpatient Hospital Stay
Admission: EM | Admit: 2022-04-06 | Discharge: 2022-04-11 | DRG: 246 | Disposition: A | Discharge status: Other Acute Inpatient Hospital | Payer: Medicare Other | Attending: Internal Medicine | Admitting: Internal Medicine

## 2022-04-06 DIAGNOSIS — I4891 Unspecified atrial fibrillation: Secondary | ICD-10-CM | POA: Diagnosis present

## 2022-04-06 DIAGNOSIS — I615 Nontraumatic intracerebral hemorrhage, intraventricular: Secondary | ICD-10-CM | POA: Diagnosis not present

## 2022-04-06 DIAGNOSIS — I639 Cerebral infarction, unspecified: Secondary | ICD-10-CM | POA: Diagnosis not present

## 2022-04-06 DIAGNOSIS — I129 Hypertensive chronic kidney disease with stage 1 through stage 4 chronic kidney disease, or unspecified chronic kidney disease: Secondary | ICD-10-CM | POA: Diagnosis present

## 2022-04-06 DIAGNOSIS — G931 Anoxic brain damage, not elsewhere classified: Secondary | ICD-10-CM | POA: Diagnosis not present

## 2022-04-06 DIAGNOSIS — Z881 Allergy status to other antibiotic agents status: Secondary | ICD-10-CM

## 2022-04-06 DIAGNOSIS — G4730 Sleep apnea, unspecified: Secondary | ICD-10-CM | POA: Diagnosis present

## 2022-04-06 DIAGNOSIS — J9601 Acute respiratory failure with hypoxia: Secondary | ICD-10-CM | POA: Diagnosis present

## 2022-04-06 DIAGNOSIS — T45515A Adverse effect of anticoagulants, initial encounter: Secondary | ICD-10-CM | POA: Diagnosis not present

## 2022-04-06 DIAGNOSIS — R570 Cardiogenic shock: Secondary | ICD-10-CM | POA: Diagnosis present

## 2022-04-06 DIAGNOSIS — N183 Chronic kidney disease, stage 3 unspecified: Secondary | ICD-10-CM | POA: Diagnosis present

## 2022-04-06 DIAGNOSIS — I2510 Atherosclerotic heart disease of native coronary artery without angina pectoris: Secondary | ICD-10-CM | POA: Diagnosis present

## 2022-04-06 DIAGNOSIS — D649 Anemia, unspecified: Secondary | ICD-10-CM | POA: Diagnosis present

## 2022-04-06 DIAGNOSIS — I444 Left anterior fascicular block: Secondary | ICD-10-CM | POA: Diagnosis present

## 2022-04-06 DIAGNOSIS — I350 Nonrheumatic aortic (valve) stenosis: Secondary | ICD-10-CM

## 2022-04-06 DIAGNOSIS — J9600 Acute respiratory failure, unspecified whether with hypoxia or hypercapnia: Secondary | ICD-10-CM

## 2022-04-06 DIAGNOSIS — I213 ST elevation (STEMI) myocardial infarction of unspecified site: Secondary | ICD-10-CM

## 2022-04-06 DIAGNOSIS — E1122 Type 2 diabetes mellitus with diabetic chronic kidney disease: Secondary | ICD-10-CM | POA: Diagnosis present

## 2022-04-06 DIAGNOSIS — Z955 Presence of coronary angioplasty implant and graft: Secondary | ICD-10-CM

## 2022-04-06 DIAGNOSIS — I77819 Aortic ectasia, unspecified site: Secondary | ICD-10-CM | POA: Diagnosis present

## 2022-04-06 DIAGNOSIS — Z7901 Long term (current) use of anticoagulants: Secondary | ICD-10-CM

## 2022-04-06 DIAGNOSIS — I2129 ST elevation (STEMI) myocardial infarction involving other sites: Secondary | ICD-10-CM | POA: Diagnosis not present

## 2022-04-06 DIAGNOSIS — I480 Paroxysmal atrial fibrillation: Secondary | ICD-10-CM | POA: Diagnosis present

## 2022-04-06 DIAGNOSIS — Z888 Allergy status to other drugs, medicaments and biological substances status: Secondary | ICD-10-CM

## 2022-04-06 DIAGNOSIS — D6832 Hemorrhagic disorder due to extrinsic circulating anticoagulants: Secondary | ICD-10-CM | POA: Diagnosis not present

## 2022-04-06 DIAGNOSIS — N170 Acute kidney failure with tubular necrosis: Secondary | ICD-10-CM | POA: Diagnosis not present

## 2022-04-06 DIAGNOSIS — Z6832 Body mass index (BMI) 32.0-32.9, adult: Secondary | ICD-10-CM

## 2022-04-06 DIAGNOSIS — G928 Other toxic encephalopathy: Secondary | ICD-10-CM | POA: Diagnosis present

## 2022-04-06 DIAGNOSIS — I10 Essential (primary) hypertension: Secondary | ICD-10-CM | POA: Diagnosis present

## 2022-04-06 DIAGNOSIS — Z7984 Long term (current) use of oral hypoglycemic drugs: Secondary | ICD-10-CM

## 2022-04-06 DIAGNOSIS — K7200 Acute and subacute hepatic failure without coma: Secondary | ICD-10-CM | POA: Diagnosis present

## 2022-04-06 DIAGNOSIS — Z79899 Other long term (current) drug therapy: Secondary | ICD-10-CM

## 2022-04-06 DIAGNOSIS — Z20822 Contact with and (suspected) exposure to covid-19: Secondary | ICD-10-CM | POA: Diagnosis present

## 2022-04-06 DIAGNOSIS — I462 Cardiac arrest due to underlying cardiac condition: Secondary | ICD-10-CM | POA: Diagnosis present

## 2022-04-06 DIAGNOSIS — T508X5A Adverse effect of diagnostic agents, initial encounter: Secondary | ICD-10-CM | POA: Diagnosis not present

## 2022-04-06 DIAGNOSIS — G40401 Other generalized epilepsy and epileptic syndromes, not intractable, with status epilepticus: Secondary | ICD-10-CM | POA: Diagnosis present

## 2022-04-06 DIAGNOSIS — I469 Cardiac arrest, cause unspecified: Secondary | ICD-10-CM

## 2022-04-06 DIAGNOSIS — R404 Transient alteration of awareness: Secondary | ICD-10-CM | POA: Diagnosis present

## 2022-04-06 DIAGNOSIS — E7800 Pure hypercholesterolemia, unspecified: Secondary | ICD-10-CM | POA: Diagnosis present

## 2022-04-06 DIAGNOSIS — E669 Obesity, unspecified: Secondary | ICD-10-CM | POA: Diagnosis present

## 2022-04-06 DIAGNOSIS — T17900A Unspecified foreign body in respiratory tract, part unspecified causing asphyxiation, initial encounter: Secondary | ICD-10-CM | POA: Diagnosis present

## 2022-04-06 DIAGNOSIS — G4733 Obstructive sleep apnea (adult) (pediatric): Secondary | ICD-10-CM | POA: Diagnosis present

## 2022-04-06 NOTE — ED Triage Notes (Signed)
Pt came ems from home, pt reported to have collapsed family did 6 min CPR, and EMS did 12mn CPR, pt shocked by EMS, [place], [place]ING airway in place. Pt IO in left shin, and 18G in LAC. Pt hx diabtes cbg 209, bp 130s/80's, in room at 2352, pt was given 3 EPI, , pt is on diltiazem, 132/76 at this time 2356.  ?

## 2022-04-07 ENCOUNTER — Encounter: Payer: Self-pay | Admitting: Cardiology

## 2022-04-07 ENCOUNTER — Inpatient Hospital Stay: Payer: Medicare Other

## 2022-04-07 ENCOUNTER — Encounter
Admission: EM | Disposition: A | Discharge status: Other Acute Inpatient Hospital | Payer: Self-pay | Source: Home / Self Care | Attending: Internal Medicine

## 2022-04-07 ENCOUNTER — Emergency Department: Payer: Medicare Other

## 2022-04-07 DIAGNOSIS — I2129 ST elevation (STEMI) myocardial infarction involving other sites: Secondary | ICD-10-CM

## 2022-04-07 DIAGNOSIS — R570 Cardiogenic shock: Secondary | ICD-10-CM | POA: Diagnosis not present

## 2022-04-07 DIAGNOSIS — D6832 Hemorrhagic disorder due to extrinsic circulating anticoagulants: Secondary | ICD-10-CM | POA: Diagnosis not present

## 2022-04-07 DIAGNOSIS — I213 ST elevation (STEMI) myocardial infarction of unspecified site: Secondary | ICD-10-CM | POA: Diagnosis not present

## 2022-04-07 DIAGNOSIS — I129 Hypertensive chronic kidney disease with stage 1 through stage 4 chronic kidney disease, or unspecified chronic kidney disease: Secondary | ICD-10-CM | POA: Diagnosis present

## 2022-04-07 DIAGNOSIS — R569 Unspecified convulsions: Secondary | ICD-10-CM | POA: Diagnosis not present

## 2022-04-07 DIAGNOSIS — K7200 Acute and subacute hepatic failure without coma: Secondary | ICD-10-CM | POA: Diagnosis present

## 2022-04-07 DIAGNOSIS — G4733 Obstructive sleep apnea (adult) (pediatric): Secondary | ICD-10-CM

## 2022-04-07 DIAGNOSIS — I615 Nontraumatic intracerebral hemorrhage, intraventricular: Secondary | ICD-10-CM | POA: Diagnosis not present

## 2022-04-07 DIAGNOSIS — N170 Acute kidney failure with tubular necrosis: Secondary | ICD-10-CM | POA: Diagnosis not present

## 2022-04-07 DIAGNOSIS — N183 Chronic kidney disease, stage 3 unspecified: Secondary | ICD-10-CM | POA: Diagnosis present

## 2022-04-07 DIAGNOSIS — I462 Cardiac arrest due to underlying cardiac condition: Secondary | ICD-10-CM | POA: Diagnosis present

## 2022-04-07 DIAGNOSIS — I350 Nonrheumatic aortic (valve) stenosis: Secondary | ICD-10-CM | POA: Diagnosis present

## 2022-04-07 DIAGNOSIS — I480 Paroxysmal atrial fibrillation: Secondary | ICD-10-CM

## 2022-04-07 DIAGNOSIS — I469 Cardiac arrest, cause unspecified: Secondary | ICD-10-CM

## 2022-04-07 DIAGNOSIS — D649 Anemia, unspecified: Secondary | ICD-10-CM | POA: Diagnosis present

## 2022-04-07 DIAGNOSIS — Z20822 Contact with and (suspected) exposure to covid-19: Secondary | ICD-10-CM | POA: Diagnosis present

## 2022-04-07 DIAGNOSIS — I25118 Atherosclerotic heart disease of native coronary artery with other forms of angina pectoris: Secondary | ICD-10-CM

## 2022-04-07 DIAGNOSIS — G928 Other toxic encephalopathy: Secondary | ICD-10-CM | POA: Diagnosis present

## 2022-04-07 DIAGNOSIS — E7800 Pure hypercholesterolemia, unspecified: Secondary | ICD-10-CM | POA: Diagnosis present

## 2022-04-07 DIAGNOSIS — I639 Cerebral infarction, unspecified: Secondary | ICD-10-CM | POA: Diagnosis not present

## 2022-04-07 DIAGNOSIS — G40401 Other generalized epilepsy and epileptic syndromes, not intractable, with status epilepticus: Secondary | ICD-10-CM | POA: Diagnosis present

## 2022-04-07 DIAGNOSIS — J9601 Acute respiratory failure with hypoxia: Secondary | ICD-10-CM | POA: Diagnosis present

## 2022-04-07 DIAGNOSIS — I9589 Other hypotension: Secondary | ICD-10-CM

## 2022-04-07 DIAGNOSIS — J9600 Acute respiratory failure, unspecified whether with hypoxia or hypercapnia: Secondary | ICD-10-CM | POA: Diagnosis not present

## 2022-04-07 DIAGNOSIS — G931 Anoxic brain damage, not elsewhere classified: Secondary | ICD-10-CM | POA: Diagnosis not present

## 2022-04-07 DIAGNOSIS — G40901 Epilepsy, unspecified, not intractable, with status epilepticus: Secondary | ICD-10-CM | POA: Diagnosis not present

## 2022-04-07 DIAGNOSIS — E1122 Type 2 diabetes mellitus with diabetic chronic kidney disease: Secondary | ICD-10-CM | POA: Diagnosis present

## 2022-04-07 DIAGNOSIS — N179 Acute kidney failure, unspecified: Secondary | ICD-10-CM | POA: Diagnosis not present

## 2022-04-07 DIAGNOSIS — E669 Obesity, unspecified: Secondary | ICD-10-CM | POA: Diagnosis present

## 2022-04-07 DIAGNOSIS — I77819 Aortic ectasia, unspecified site: Secondary | ICD-10-CM | POA: Diagnosis present

## 2022-04-07 DIAGNOSIS — I2510 Atherosclerotic heart disease of native coronary artery without angina pectoris: Secondary | ICD-10-CM | POA: Diagnosis present

## 2022-04-07 HISTORY — PX: CORONARY ANGIOGRAPHY: CATH118303

## 2022-04-07 HISTORY — PX: CORONARY/GRAFT ACUTE MI REVASCULARIZATION: CATH118305

## 2022-04-07 LAB — URINALYSIS, ROUTINE W REFLEX MICROSCOPIC
Bilirubin Urine: NEGATIVE
Glucose, UA: NEGATIVE mg/dL
Hgb urine dipstick: NEGATIVE
Ketones, ur: NEGATIVE mg/dL
Leukocytes,Ua: NEGATIVE
Nitrite: NEGATIVE
Protein, ur: 100 mg/dL — AB
Specific Gravity, Urine: 1.015 (ref 1.005–1.030)
pH: 5 (ref 5.0–8.0)

## 2022-04-07 LAB — CBC
HCT: 34 % — ABNORMAL LOW (ref 39.0–52.0)
Hemoglobin: 10.8 g/dL — ABNORMAL LOW (ref 13.0–17.0)
MCH: 26.7 pg (ref 26.0–34.0)
MCHC: 31.8 g/dL (ref 30.0–36.0)
MCV: 84.2 fL (ref 80.0–100.0)
Platelets: 218 10*3/uL (ref 150–400)
RBC: 4.04 MIL/uL — ABNORMAL LOW (ref 4.22–5.81)
RDW: 15.4 % (ref 11.5–15.5)
WBC: 11 10*3/uL — ABNORMAL HIGH (ref 4.0–10.5)
nRBC: 0 % (ref 0.0–0.2)

## 2022-04-07 LAB — CBC WITH DIFFERENTIAL/PLATELET
Abs Immature Granulocytes: 0.36 10*3/uL — ABNORMAL HIGH (ref 0.00–0.07)
Basophils Absolute: 0.1 10*3/uL (ref 0.0–0.1)
Basophils Relative: 1 %
Eosinophils Absolute: 0.2 10*3/uL (ref 0.0–0.5)
Eosinophils Relative: 1 %
HCT: 38.8 % — ABNORMAL LOW (ref 39.0–52.0)
Hemoglobin: 12.1 g/dL — ABNORMAL LOW (ref 13.0–17.0)
Immature Granulocytes: 3 %
Lymphocytes Relative: 40 %
Lymphs Abs: 5.1 10*3/uL — ABNORMAL HIGH (ref 0.7–4.0)
MCH: 27.3 pg (ref 26.0–34.0)
MCHC: 31.2 g/dL (ref 30.0–36.0)
MCV: 87.4 fL (ref 80.0–100.0)
Monocytes Absolute: 0.6 10*3/uL (ref 0.1–1.0)
Monocytes Relative: 5 %
Neutro Abs: 6.5 10*3/uL (ref 1.7–7.7)
Neutrophils Relative %: 50 %
Platelets: 197 10*3/uL (ref 150–400)
RBC: 4.44 MIL/uL (ref 4.22–5.81)
RDW: 15.5 % (ref 11.5–15.5)
WBC: 12.7 10*3/uL — ABNORMAL HIGH (ref 4.0–10.5)
nRBC: 0 % (ref 0.0–0.2)

## 2022-04-07 LAB — BLOOD GAS, ARTERIAL
Acid-base deficit: 5.6 mmol/L — ABNORMAL HIGH (ref 0.0–2.0)
Acid-base deficit: 5.8 mmol/L — ABNORMAL HIGH (ref 0.0–2.0)
Bicarbonate: 19.3 mmol/L — ABNORMAL LOW (ref 20.0–28.0)
Bicarbonate: 20.1 mmol/L (ref 20.0–28.0)
FIO2: 100 %
FIO2: 100 %
MECHVT: 520 mL
MECHVT: 520 mL
Mechanical Rate: 20
O2 Saturation: 99.8 %
O2 Saturation: 99.9 %
PEEP: 10 cmH2O
PEEP: 5 cmH2O
Patient temperature: 37
Patient temperature: 37
RATE: 20 resp/min
pCO2 arterial: 35 mmHg (ref 32–48)
pCO2 arterial: 40 mmHg (ref 32–48)
pH, Arterial: 7.35 (ref 7.35–7.45)
pH, Arterial: 7.31 — ABNORMAL LOW (ref 7.35–7.45)
pO2, Arterial: 142 mmHg — ABNORMAL HIGH (ref 83–108)
pO2, Arterial: 161 mmHg — ABNORMAL HIGH (ref 83–108)

## 2022-04-07 LAB — COMPREHENSIVE METABOLIC PANEL
ALT: 184 U/L — ABNORMAL HIGH (ref 0–44)
ALT: 189 U/L — ABNORMAL HIGH (ref 0–44)
AST: 218 U/L — ABNORMAL HIGH (ref 15–41)
AST: 362 U/L — ABNORMAL HIGH (ref 15–41)
Albumin: 3.5 g/dL (ref 3.5–5.0)
Albumin: 3.3 g/dL — ABNORMAL LOW (ref 3.5–5.0)
Alkaline Phosphatase: 50 U/L (ref 38–126)
Alkaline Phosphatase: 41 U/L (ref 38–126)
Anion gap: 16 — ABNORMAL HIGH (ref 5–15)
Anion gap: 10 (ref 5–15)
BUN: 31 mg/dL — ABNORMAL HIGH (ref 8–23)
BUN: 37 mg/dL — ABNORMAL HIGH (ref 8–23)
CO2: 17 mmol/L — ABNORMAL LOW (ref 22–32)
CO2: 23 mmol/L (ref 22–32)
Calcium: 8.1 mg/dL — ABNORMAL LOW (ref 8.9–10.3)
Calcium: 8.1 mg/dL — ABNORMAL LOW (ref 8.9–10.3)
Chloride: 106 mmol/L (ref 98–111)
Chloride: 108 mmol/L (ref 98–111)
Creatinine, Ser: 1.74 mg/dL — ABNORMAL HIGH (ref 0.61–1.24)
Creatinine, Ser: 2.25 mg/dL — ABNORMAL HIGH (ref 0.61–1.24)
GFR, Estimated: 41 mL/min — ABNORMAL LOW (ref >60)
GFR, Estimated: 30 mL/min — ABNORMAL LOW (ref >60)
Glucose, Bld: 273 mg/dL — ABNORMAL HIGH (ref 70–99)
Glucose, Bld: 209 mg/dL — ABNORMAL HIGH (ref 70–99)
Potassium: 3.5 mmol/L (ref 3.5–5.1)
Potassium: 4.4 mmol/L (ref 3.5–5.1)
Sodium: 139 mmol/L (ref 135–145)
Sodium: 141 mmol/L (ref 135–145)
Total Bilirubin: 0.4 mg/dL (ref 0.3–1.2)
Total Bilirubin: 0.5 mg/dL (ref 0.3–1.2)
Total Protein: 6.8 g/dL (ref 6.5–8.1)
Total Protein: 6.2 g/dL — ABNORMAL LOW (ref 6.5–8.1)

## 2022-04-07 LAB — LIPID PANEL
Cholesterol: 140 mg/dL (ref 0–200)
HDL: 34 mg/dL — ABNORMAL LOW (ref >40)
LDL Cholesterol: 62 mg/dL (ref 0–99)
Total CHOL/HDL Ratio: 4.1 RATIO
Triglycerides: 221 mg/dL — ABNORMAL HIGH (ref <150)
VLDL: 44 mg/dL — ABNORMAL HIGH (ref 0–40)

## 2022-04-07 LAB — BASIC METABOLIC PANEL
Anion gap: 5 (ref 5–15)
BUN: 34 mg/dL — ABNORMAL HIGH (ref 8–23)
CO2: 21 mmol/L — ABNORMAL LOW (ref 22–32)
Calcium: 7.8 mg/dL — ABNORMAL LOW (ref 8.9–10.3)
Chloride: 110 mmol/L (ref 98–111)
Creatinine, Ser: 1.81 mg/dL — ABNORMAL HIGH (ref 0.61–1.24)
GFR, Estimated: 39 mL/min — ABNORMAL LOW (ref >60)
Glucose, Bld: 233 mg/dL — ABNORMAL HIGH (ref 70–99)
Potassium: 4.6 mmol/L (ref 3.5–5.1)
Sodium: 136 mmol/L (ref 135–145)

## 2022-04-07 LAB — RESP PANEL BY RT-PCR (FLU A&B, COVID) ARPGX2
Influenza A by PCR: NEGATIVE
Influenza B by PCR: NEGATIVE
SARS Coronavirus 2 by RT PCR: NEGATIVE

## 2022-04-07 LAB — PROTIME-INR
INR: 1.7 — ABNORMAL HIGH (ref 0.8–1.2)
Prothrombin Time: 19.9 seconds — ABNORMAL HIGH (ref 11.4–15.2)

## 2022-04-07 LAB — PHOSPHORUS: Phosphorus: 3.9 mg/dL (ref 2.5–4.6)

## 2022-04-07 LAB — GLUCOSE, CAPILLARY
Glucose-Capillary: 188 mg/dL — ABNORMAL HIGH (ref 70–99)
Glucose-Capillary: 193 mg/dL — ABNORMAL HIGH (ref 70–99)
Glucose-Capillary: 195 mg/dL — ABNORMAL HIGH (ref 70–99)
Glucose-Capillary: 198 mg/dL — ABNORMAL HIGH (ref 70–99)
Glucose-Capillary: 202 mg/dL — ABNORMAL HIGH (ref 70–99)
Glucose-Capillary: 214 mg/dL — ABNORMAL HIGH (ref 70–99)

## 2022-04-07 LAB — APTT: aPTT: 37 seconds — ABNORMAL HIGH (ref 24–36)

## 2022-04-07 LAB — MAGNESIUM: Magnesium: 1.4 mg/dL — ABNORMAL LOW (ref 1.7–2.4)

## 2022-04-07 LAB — TROPONIN I (HIGH SENSITIVITY)
Troponin I (High Sensitivity): 110 ng/L (ref <18)
Troponin I (High Sensitivity): 21580 ng/L (ref <18)
Troponin I (High Sensitivity): >24000 ng/L (ref <18)

## 2022-04-07 LAB — POCT ACTIVATED CLOTTING TIME
Activated Clotting Time: 269 seconds
Activated Clotting Time: 269 seconds
Activated Clotting Time: 293 seconds

## 2022-04-07 LAB — LACTIC ACID, PLASMA: Lactic Acid, Venous: 2.2 mmol/L (ref 0.5–1.9)

## 2022-04-07 LAB — HEMOGLOBIN AND HEMATOCRIT, BLOOD
HCT: 31.6 % — ABNORMAL LOW (ref 39.0–52.0)
Hemoglobin: 10 g/dL — ABNORMAL LOW (ref 13.0–17.0)

## 2022-04-07 LAB — HEMOGLOBIN A1C
Hgb A1c MFr Bld: 7.6 % — ABNORMAL HIGH (ref 4.8–5.6)
Mean Plasma Glucose: 171.42 mg/dL

## 2022-04-07 IMAGING — DX DG ABDOMEN 1V
1 series · 2 of 2 positions shown · non-contrast
Comparison: Portable chest at the same time. Prior abdominal film
[DATE].

CLINICAL DATA: 70-year-old male central line and enteric tube
placement.

EXAM:
ABDOMEN - 1 VIEW

[Series 1: abdomen supine · 0.14mm/px · 2 of 2 slices shown]
[im 1/2]
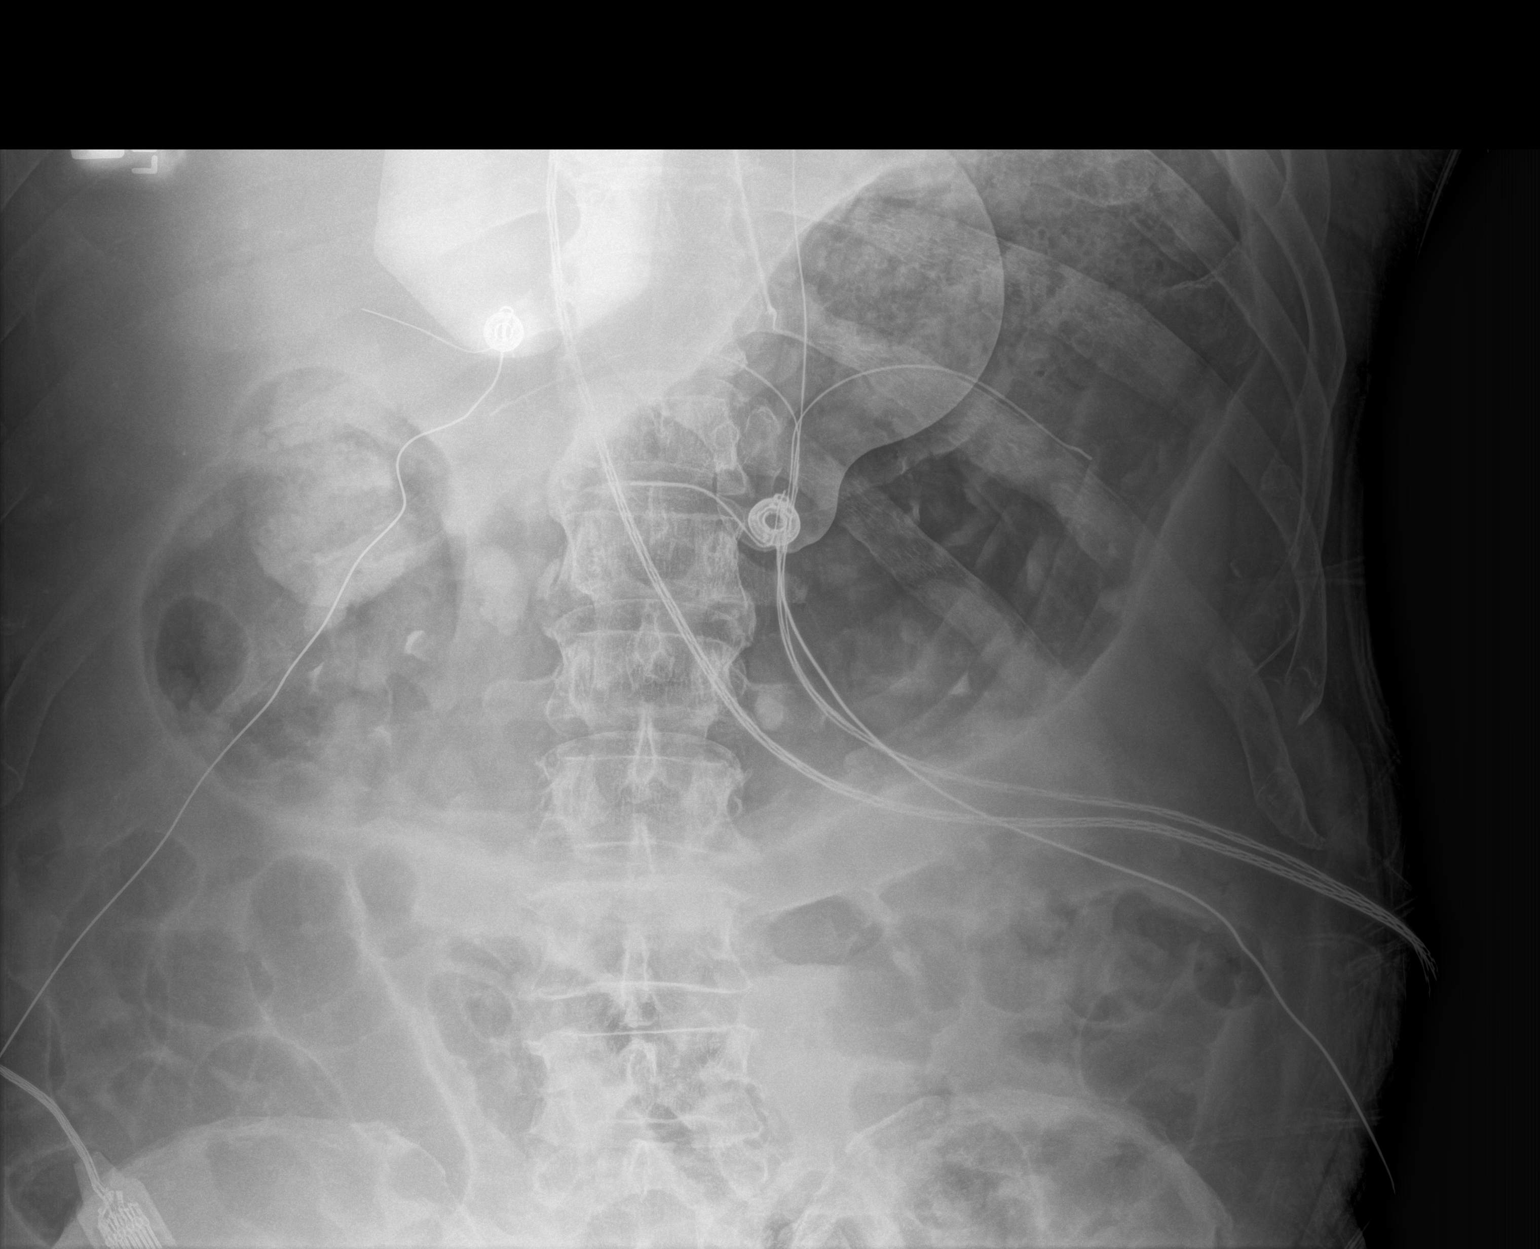
[im 2/2]
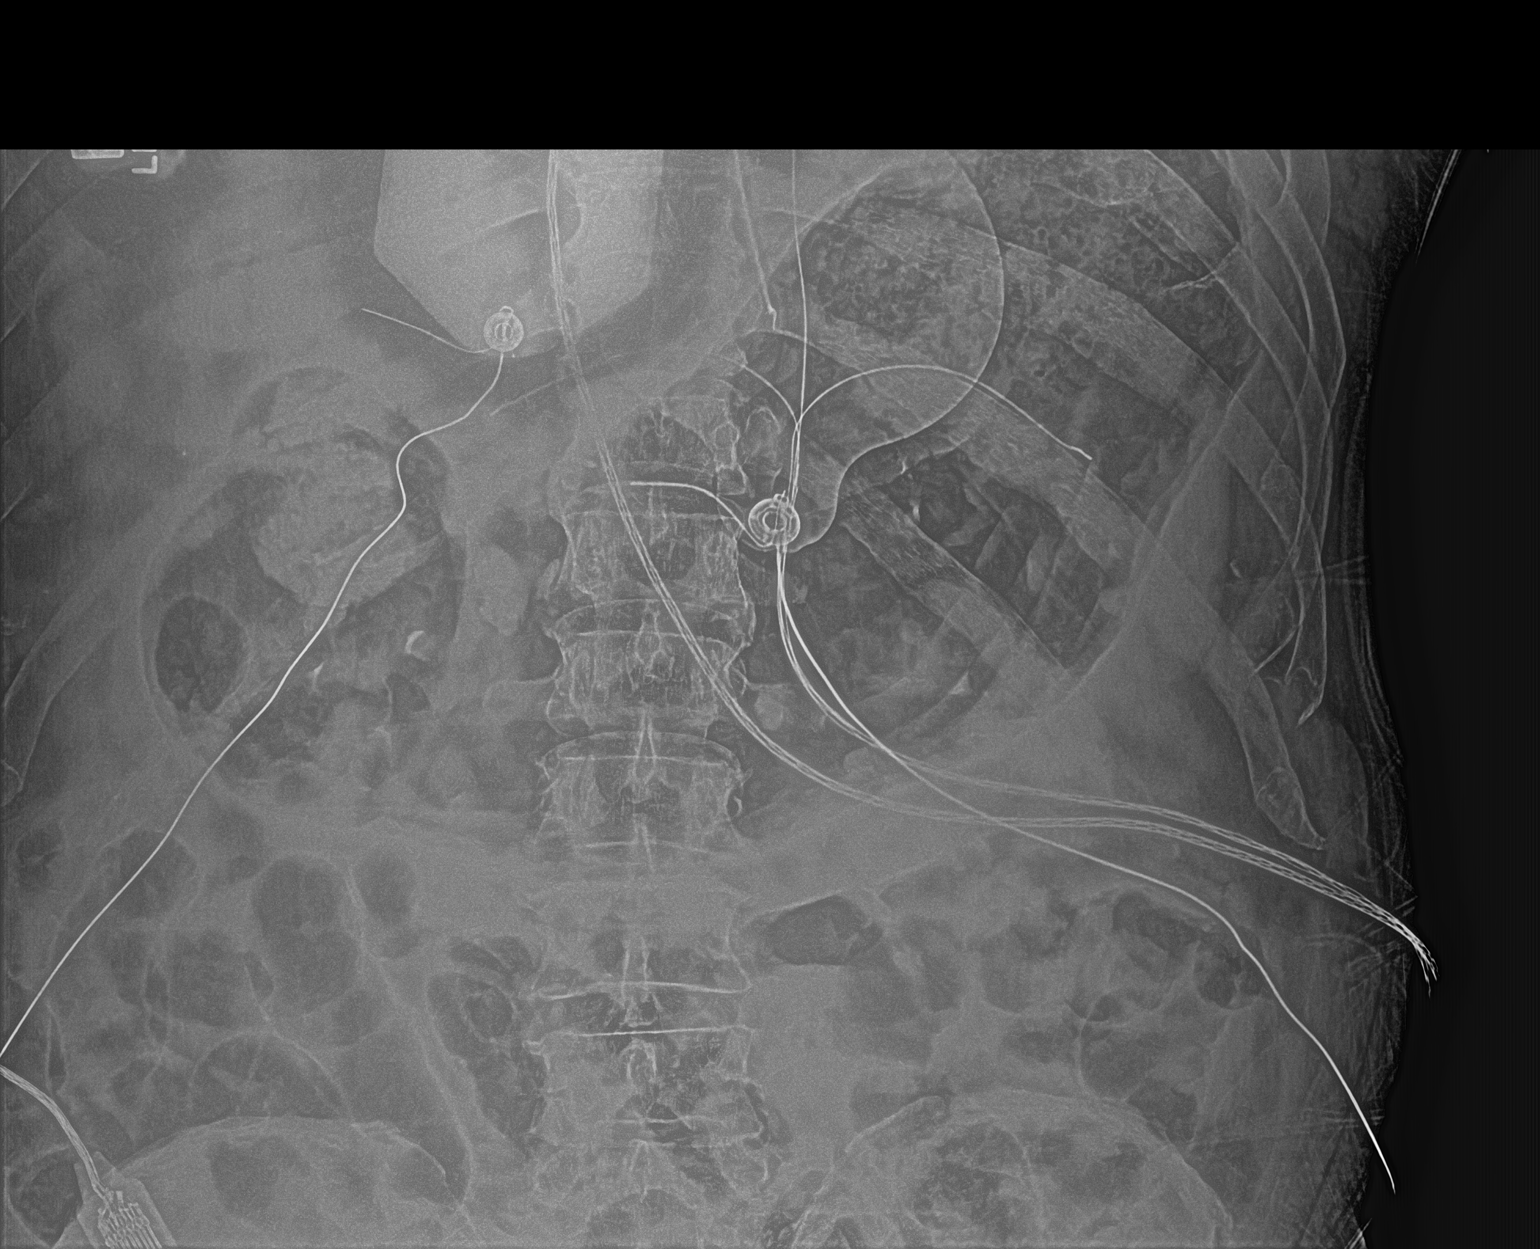

[2 of 2 positions shown; findings below may reference images not displayed]

FINDINGS: Portable AP semi upright view at [G2] hours. Enteric tube tip
courses to the GEJ, but the tube appears to be proximal to the
stomach which is moderately distended with gas. Gas-filled but
nondilated small bowel throughout the visible lower abdomen. No
acute osseous abnormality identified.
IMPRESSION: 1. Enteric tube tip at the GEJ. Advance 8 cm to ensure side hole
placement within the stomach.
2. Moderately distended stomach with gas. Gas-filled small bowel
throughout the abdomen.

## 2022-04-07 IMAGING — DX DG CHEST 1V PORT
1 series · 1 of 1 positions shown · non-contrast
Comparison: [DATE]

CLINICAL DATA: Tube placement

EXAM:
PORTABLE CHEST 1 VIEW

[chest ap]
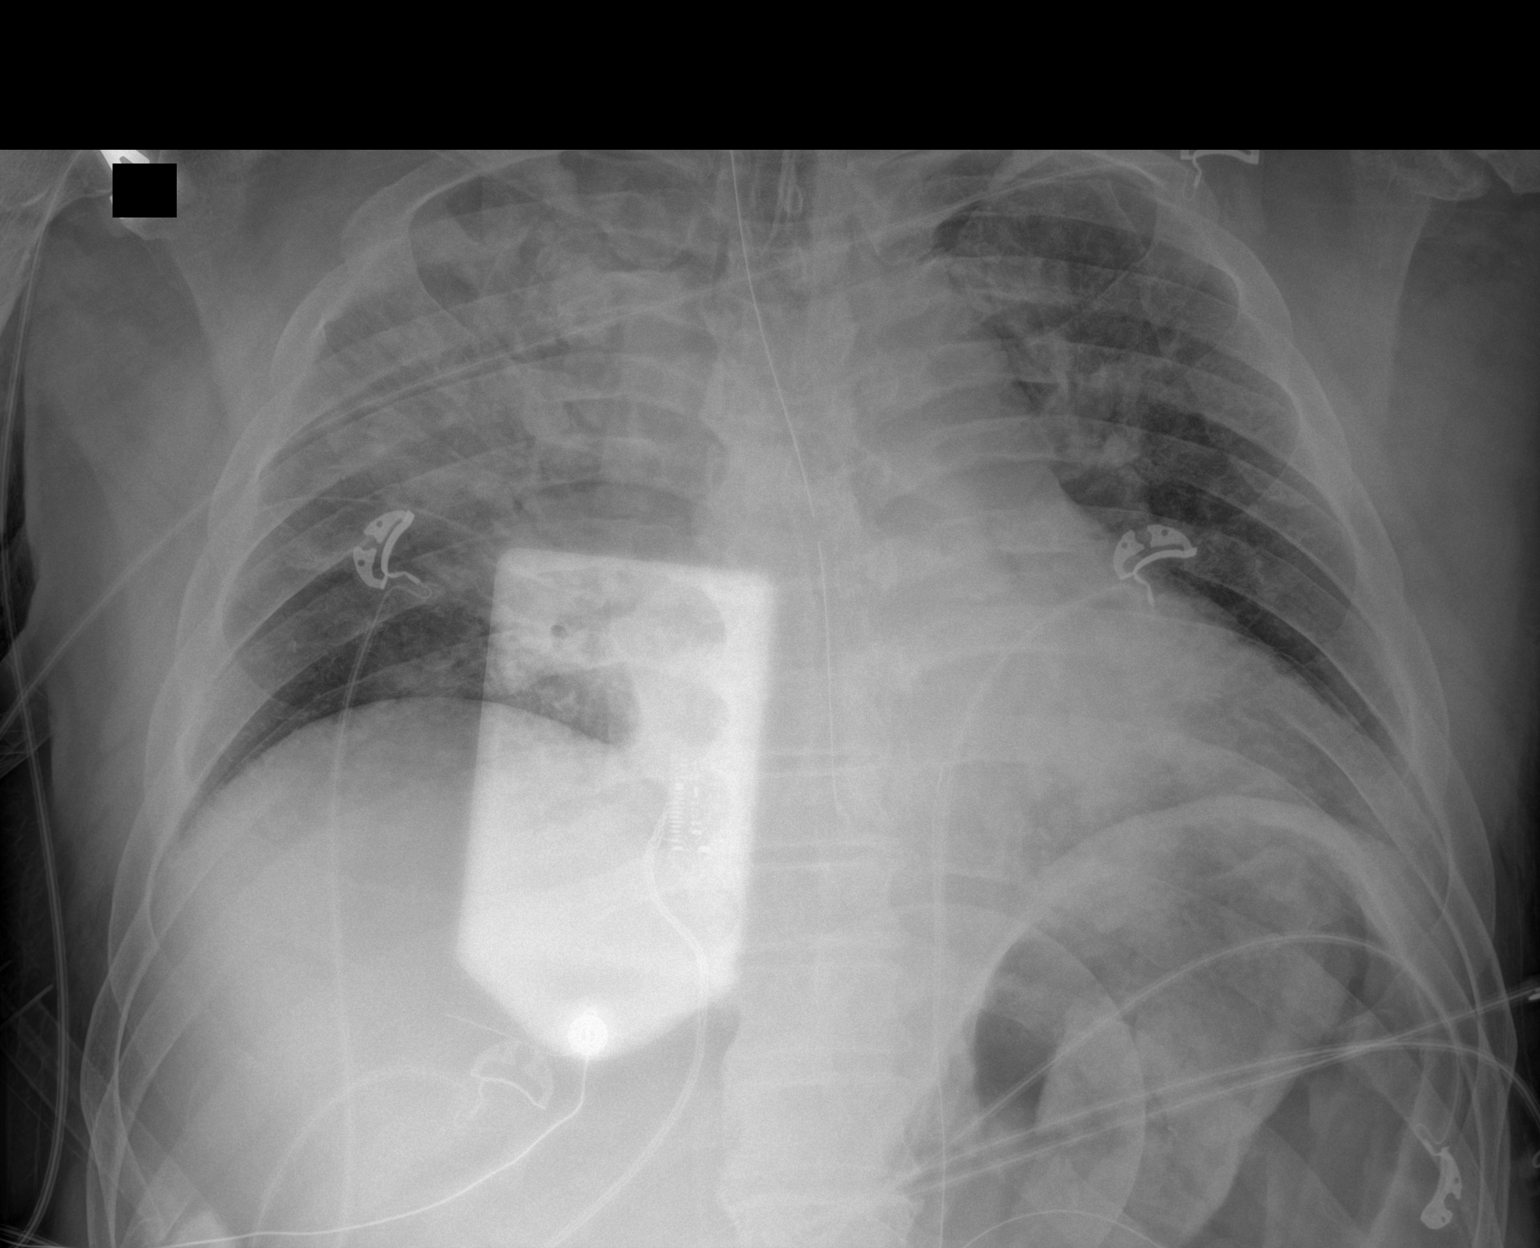

[1 of 1 positions shown; findings below may reference images not displayed]

FINDINGS: Endotracheal tube is 4.6 cm above the carina. NG tube tip is in the
mid to distal esophagus. This should be advanced approximately 10-15
cm. Bilateral airspace disease noted, right greater than left, most
pronounced in the right upper lobe. Heart is borderline in size. No
effusions or pneumothorax. Mild gaseous distention of the stomach.
IMPRESSION: Endotracheal tube 4.6 cm above the carina. NG tube in the mid to
distal esophagus. This should be advanced 10-15 cm for gastric
placement. Mild gaseous distention of the stomach.

Bilateral airspace disease, right greater than left concerning for
pneumonia or asymmetric edema.

## 2022-04-07 IMAGING — DX DG ABDOMEN 1V
1 series · 1 of 1 positions shown · non-contrast
Comparison: Earlier same day

CLINICAL DATA: NG tube placement.

EXAM:
ABDOMEN - 1 VIEW

[abdomen supine]
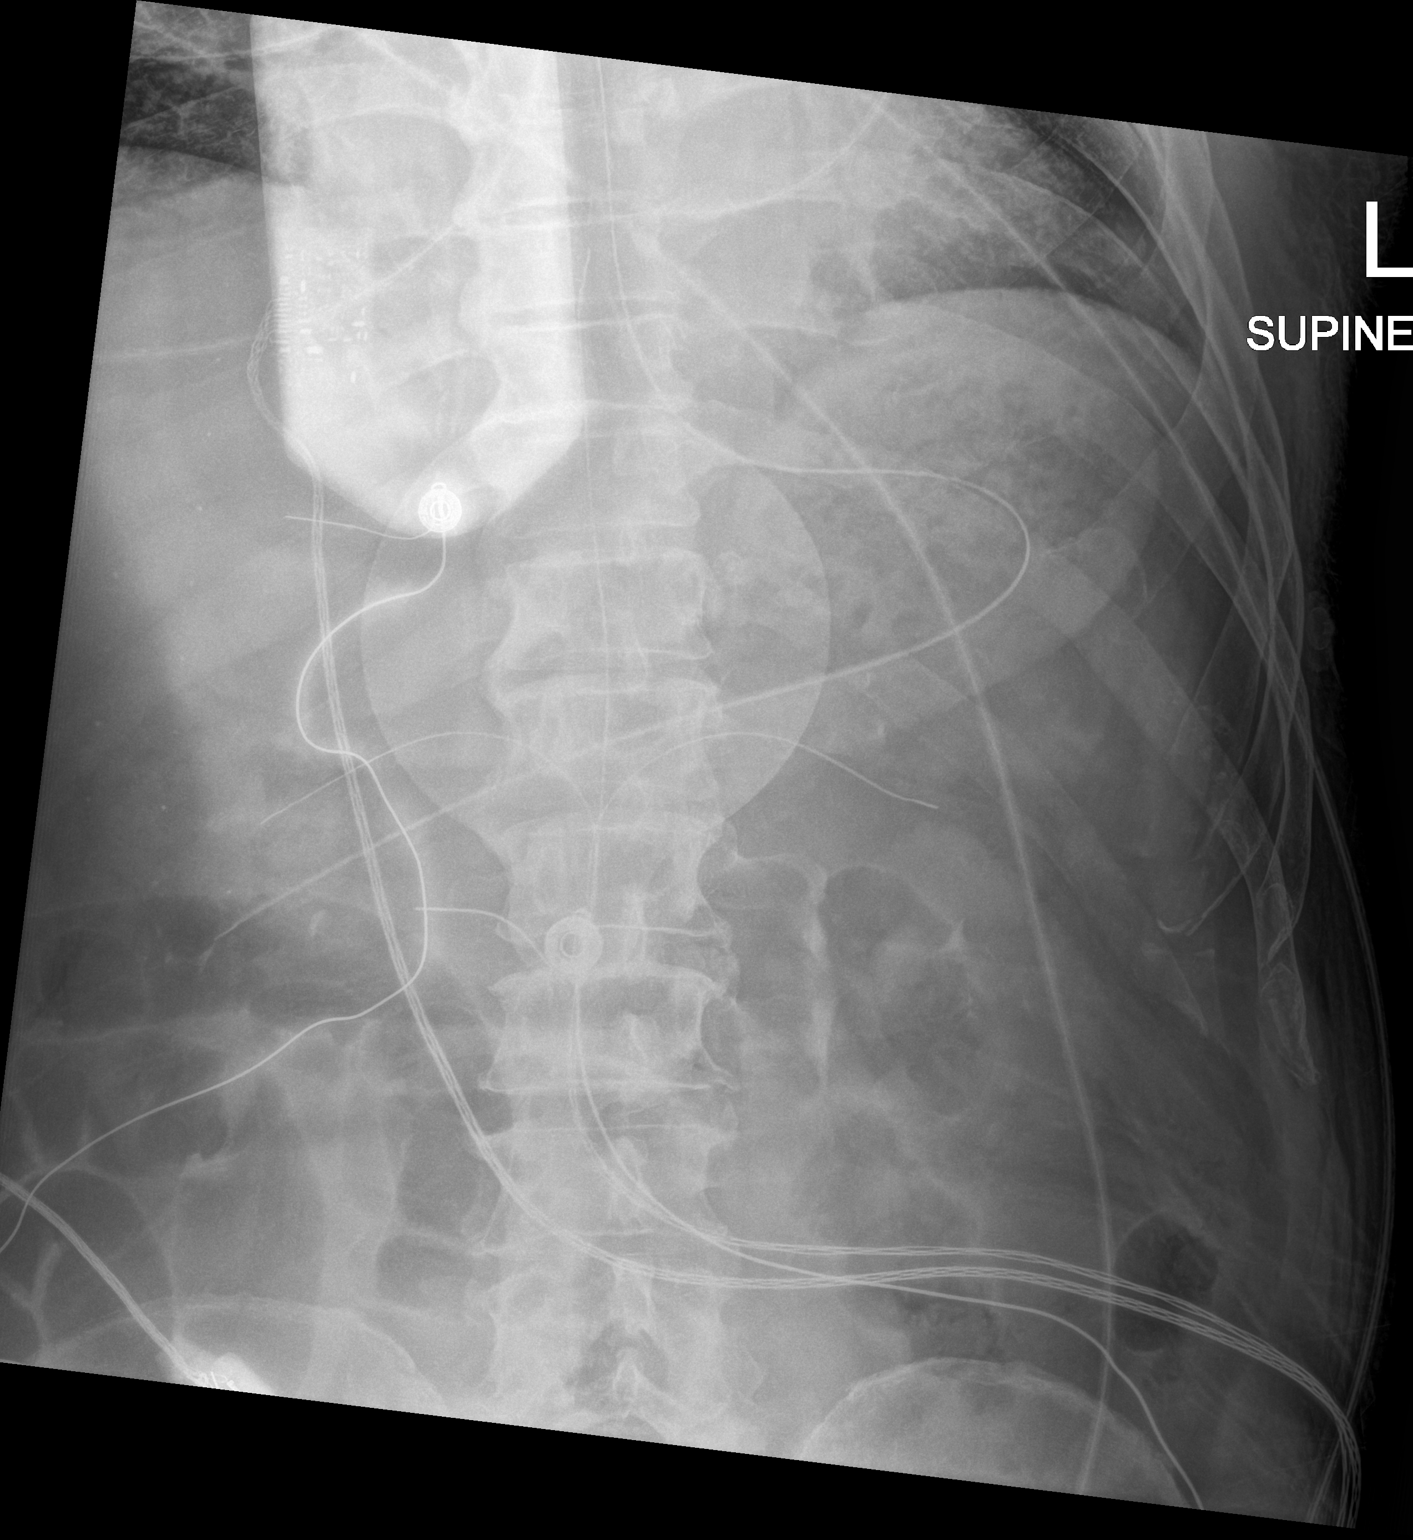

[1 of 1 positions shown; findings below may reference images not displayed]

FINDINGS: [GD] hours. NG tube tip is in the distal stomach. Nonspecific bowel
gas pattern.
IMPRESSION: NG tube tip is in the distal stomach.

## 2022-04-07 IMAGING — MR MR HEAD W/O CM
11 series · 46 of 48 positions shown · non-contrast
Comparison: None.

CLINICAL DATA: Anoxic brain damage.

EXAM:
MRI HEAD WITHOUT CONTRAST
TECHNIQUE: Multiplanar, multiecho pulse sequences of the brain and surrounding
structures were obtained without intravenous contrast.

[Series 5: ax dwi_tracew · axial · 3.0mm · 0.65mm/px · z∈[-7,+142]mm · 4 of 48 slices shown]
[im 1/48]
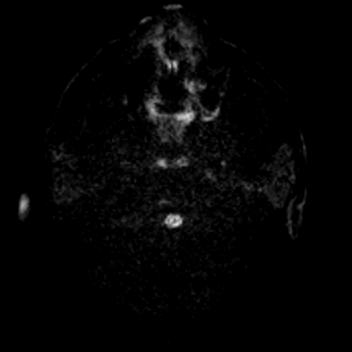
[im 16/48]
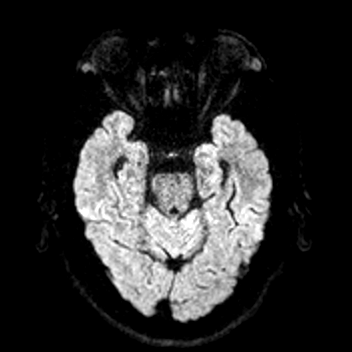
[im 32/48]
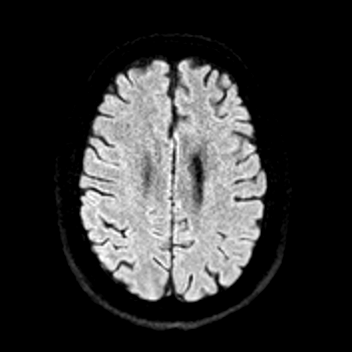
[im 48/48]
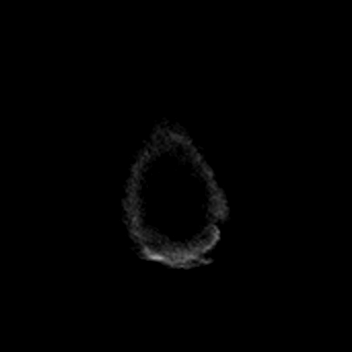

[Series 6: ax dwi_adc · axial · 3.0mm · 0.65mm/px · z∈[-7,+142]mm · 4 of 48 slices shown]
[im 1/48]
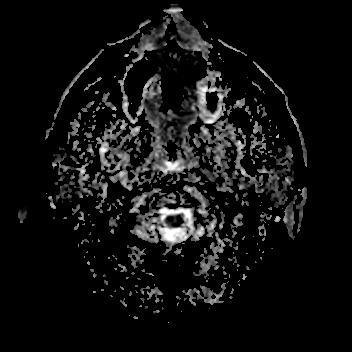
[im 16/48]
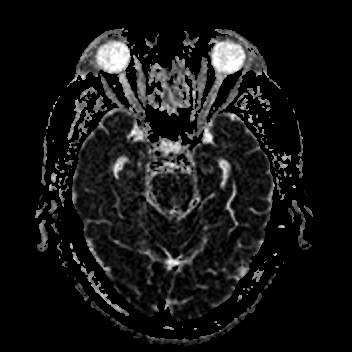
[im 32/48]
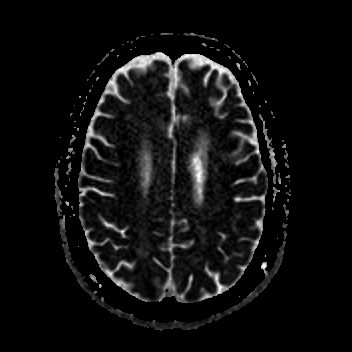
[im 48/48]
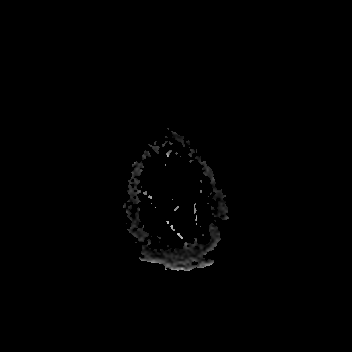

[Series 7: cor dwi_tracew · coronal · 5.0mm · 0.65mm/px · 3 of 40 slices shown]
[im 1/40]
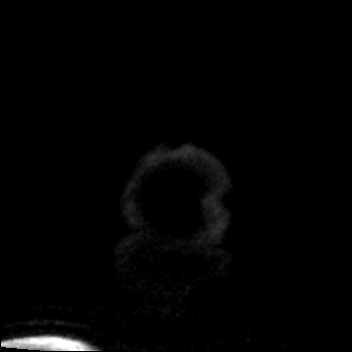
[im 20/40]
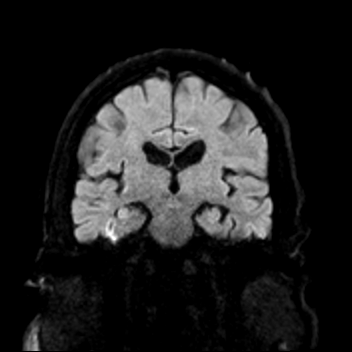
[im 40/40]
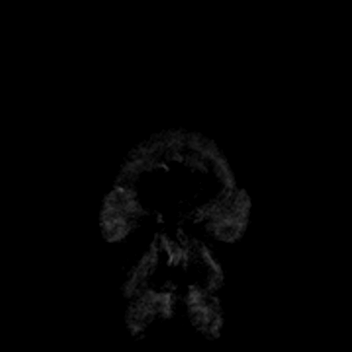

[Series 8: cor dwi_adc · coronal · 5.0mm · 0.65mm/px · 3 of 40 slices shown]
[im 1/40]
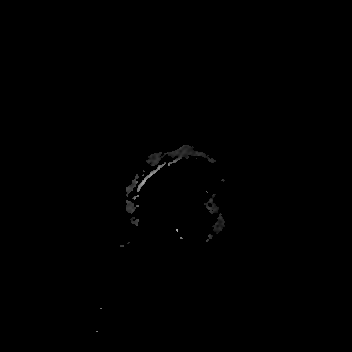
[im 20/40]
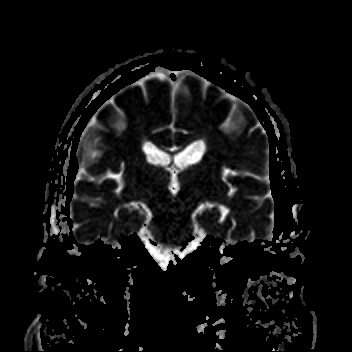
[im 40/40]
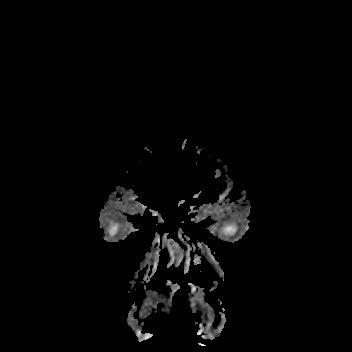

[Series 9: T1 · sagittal · 5.0mm · 0.62mm/px · 2 of 25 slices shown (1 of 2)]
[im 1/25]
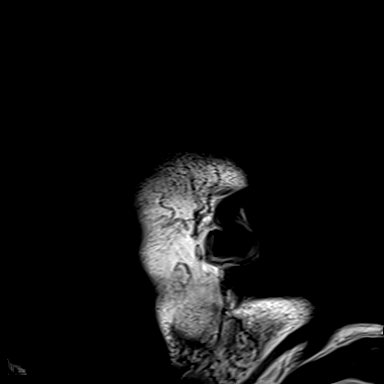
[im 25/25]
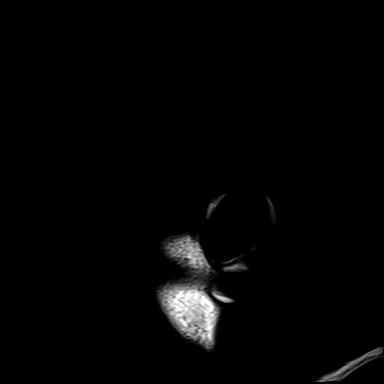

[Series 10: T2 · axial · 5.0mm · 0.53mm/px · z∈[-8,+141]mm · 2 of 27 slices shown (1 of 2)]
[im 1/27]
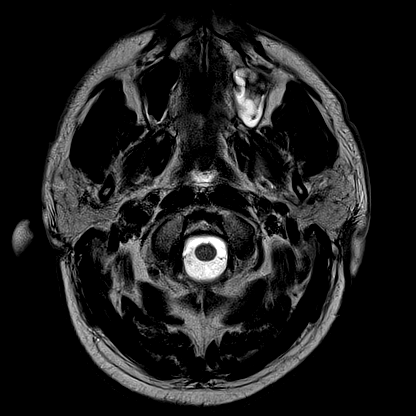
[im 27/27]
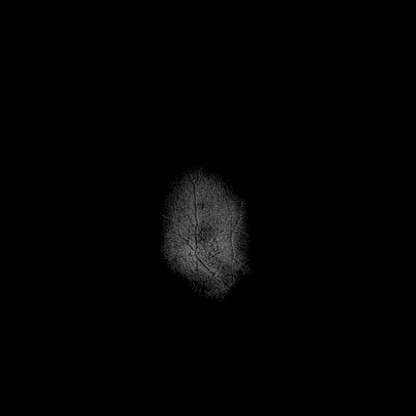

[Series 12: pha_images · axial · 3.0mm · 0.90mm/px · z∈[-17,+147]mm · 5 of 58 slices shown]
[im 1/58]
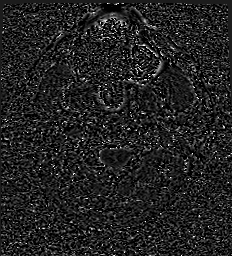
[im 15/58]
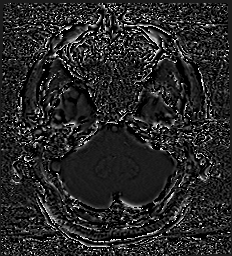
[im 29/58]
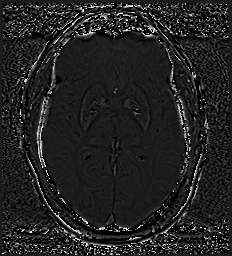
[im 43/58]
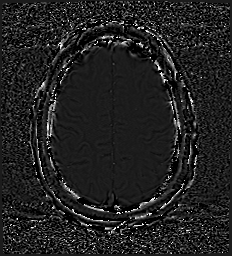
[im 58/58]
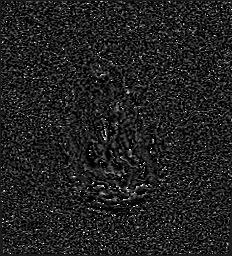

[Series 13: swi_images · axial · 3.0mm · 0.90mm/px · z∈[-17,+153]mm · 5 of 60 slices shown]
[im 1/60]
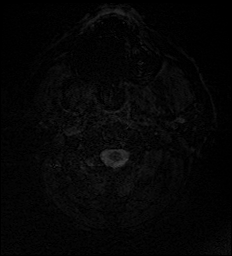
[im 15/60]
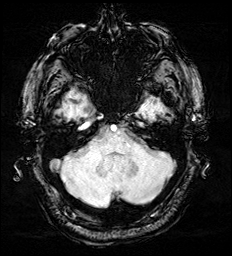
[im 30/60]
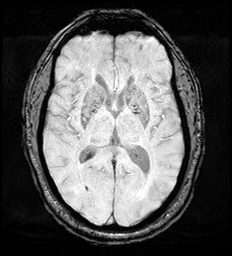
[im 45/60]
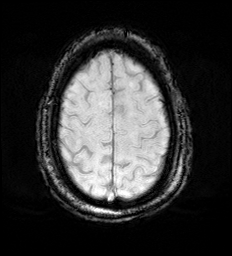
[im 60/60]
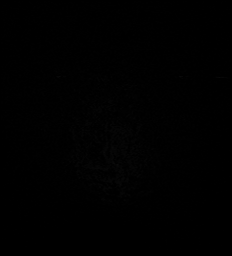

[Series 15: FLAIR · axial · 3.0mm · 0.53mm/px · z∈[-11,+144]mm · 4 of 55 slices shown]
[im 1/55]
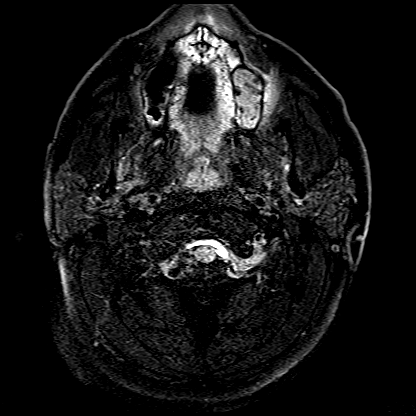
[im 19/55]
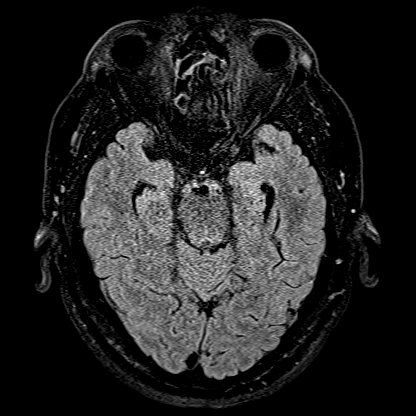
[im 37/55]
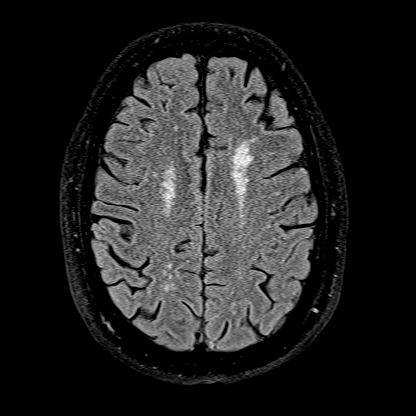
[im 55/55]
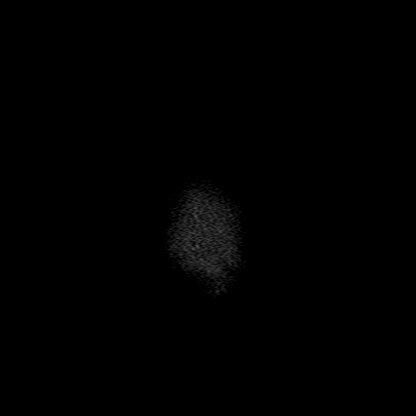

[Series 16: T1 · axial · 1.0mm · 0.98mm/px · z∈[-15,+152]mm · 12 of 176 slices shown (2 of 2)]
[im 1/176]
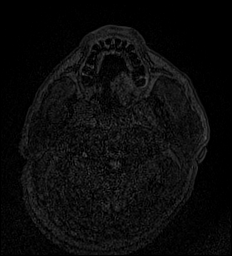
[im 14/176]
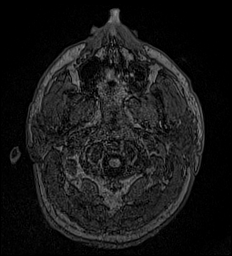
[im 27/176]
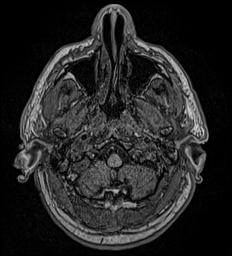
[im 41/176]
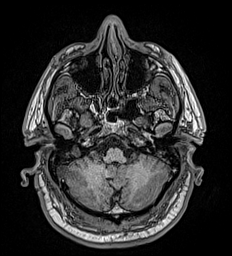
[im 54/176]
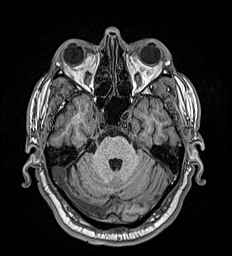
[im 68/176]
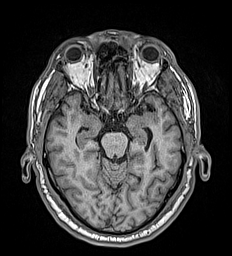
[im 81/176]
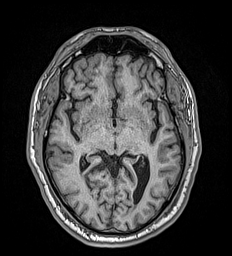
[im 95/176]
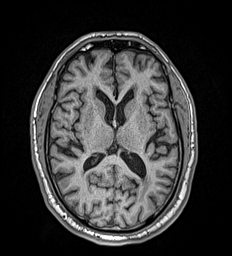
[im 108/176]
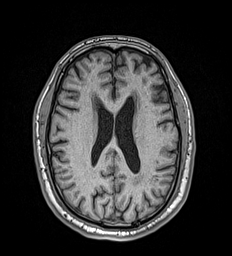
[im 122/176]
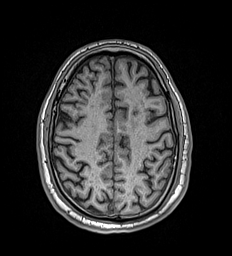
[im 149/176]
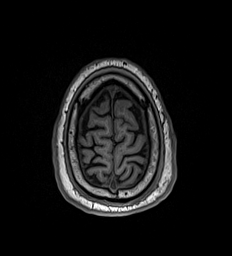
[im 176/176]
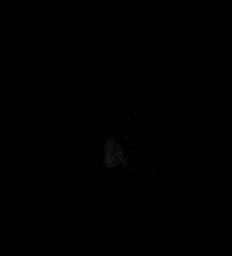

[Series 17: T2 · coronal · 5.0mm · 0.57mm/px · 2 of 31 slices shown (2 of 2)]
[im 1/31]
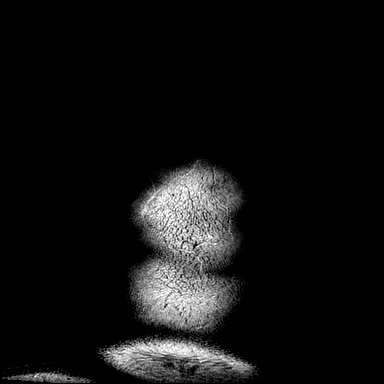
[im 31/31]
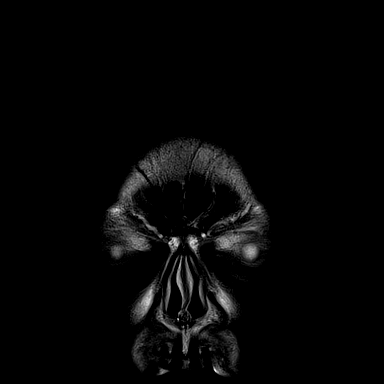

[46 of 48 positions shown; findings below may reference images not displayed]

FINDINGS: Brain: Punctate focus of restricted diffusion in the right
cerebellar hemisphere (series 5, image 70). Susceptibility artifact
in the occipital horn of the right lateral ventricle suggesting
minimal intraventricular hemorrhage. Linear susceptibility artifact
is also seen in the posterior temporal lobe, may represent trace
subarachnoid hemorrhage versus vessel thrombosis. No evidence of
diffuse anoxic brain injury.

Scattered foci of T2 hyperintensity are seen within the white matter
of the cerebral hemispheres, nonspecific, most likely related to
chronic small vessel ischemia. Small remote infarct in the left
centrum semiovale.

Vascular: Normal flow voids.

Skull and upper cervical spine: Normal marrow signal.

Sinuses/Orbits: Trace mucosal thickening throughout the paranasal
sinuses. Bilateral lens surgery. Bilateral mastoid effusion.

Other: None.
IMPRESSION: 1. Punctate acute/subacute infarct in the right cerebellar
hemisphere.
2. Trace intraventricular hemorrhage in the occipital horn of the
right lateral ventricle.
3. No evidence of global anoxic injury. Repeat study within 2-4 days
may be obtained to exclude delayed injury.

## 2022-04-07 SURGERY — CORONARY/GRAFT ACUTE MI REVASCULARIZATION
Anesthesia: Moderate Sedation

## 2022-04-07 MED ORDER — INSULIN ASPART 100 UNIT/ML IJ SOLN
0.0000 [IU] | INTRAMUSCULAR | Status: DC
  Administered 2022-04-07: 3 [IU] via SUBCUTANEOUS
  Administered 2022-04-07 – 2022-04-09 (×14): 2 [IU] via SUBCUTANEOUS
  Administered 2022-04-10: 1 [IU] via SUBCUTANEOUS
  Administered 2022-04-10 (×4): 2 [IU] via SUBCUTANEOUS
  Administered 2022-04-10 (×2): 1 [IU] via SUBCUTANEOUS
  Administered 2022-04-11: 2 [IU] via SUBCUTANEOUS
  Administered 2022-04-11: 1 [IU] via SUBCUTANEOUS
  Administered 2022-04-11: 2 [IU] via SUBCUTANEOUS
  Administered 2022-04-11: 3 [IU] via SUBCUTANEOUS
  Administered 2022-04-11: 2 [IU] via SUBCUTANEOUS
  Filled 2022-04-07 (×27): qty 1

## 2022-04-07 MED ORDER — PROPOFOL 1000 MG/100ML IV EMUL
0.0000 ug/kg/min | INTRAVENOUS | Status: DC
  Administered 2022-04-07 (×2): 20 ug/kg/min via INTRAVENOUS
  Administered 2022-04-07: 30 ug/kg/min via INTRAVENOUS
  Administered 2022-04-08 (×2): 20 ug/kg/min via INTRAVENOUS
  Filled 2022-04-07 (×7): qty 100

## 2022-04-07 MED ORDER — ONDANSETRON HCL 4 MG/2ML IJ SOLN: 4.0000 mg | Freq: Four times a day (QID) | INTRAMUSCULAR | Status: DC | PRN

## 2022-04-07 MED ORDER — SODIUM CHLORIDE 0.9 % IV SOLN
2000.0000 mg | Freq: Once | INTRAVENOUS | Status: AC
  Administered 2022-04-07: 2000 mg via INTRAVENOUS
  Filled 2022-04-07: qty 20

## 2022-04-07 MED ORDER — FENTANYL CITRATE (PF) 100 MCG/2ML IJ SOLN
INTRAMUSCULAR | Status: AC
  Filled 2022-04-07: qty 2

## 2022-04-07 MED ORDER — LEVETIRACETAM IN NACL 500 MG/100ML IV SOLN
500.0000 mg | Freq: Two times a day (BID) | INTRAVENOUS | Status: DC
  Administered 2022-04-07 – 2022-04-08 (×2): 500 mg via INTRAVENOUS
  Filled 2022-04-07 (×2): qty 100

## 2022-04-07 MED ORDER — MIDAZOLAM HCL 2 MG/2ML IJ SOLN
INTRAMUSCULAR | Status: AC
Start: 2022-04-07 — End: 2022-04-07
  Administered 2022-04-07: 2 mg via INTRAVENOUS
  Filled 2022-04-07: qty 2

## 2022-04-07 MED ORDER — ASPIRIN 81 MG PO CHEW
81.0000 mg | CHEWABLE_TABLET | Freq: Every day | ORAL | Status: DC
  Administered 2022-04-07 – 2022-04-08 (×2): 81 mg via ORAL
  Filled 2022-04-07 (×2): qty 1

## 2022-04-07 MED ORDER — POLYETHYLENE GLYCOL 3350 17 G PO PACK
17.0000 g | PACK | Freq: Every day | ORAL | Status: DC
  Administered 2022-04-07 – 2022-04-11 (×5): 17 g
  Filled 2022-04-07 (×5): qty 1

## 2022-04-07 MED ORDER — FENTANYL CITRATE (PF) 100 MCG/2ML IJ SOLN
INTRAMUSCULAR | Status: DC | PRN
  Administered 2022-04-07 (×2): 50 ug via INTRAVENOUS

## 2022-04-07 MED ORDER — VERAPAMIL HCL 2.5 MG/ML IV SOLN
INTRAVENOUS | Status: AC
  Filled 2022-04-07: qty 2

## 2022-04-07 MED ORDER — MIDAZOLAM HCL 2 MG/2ML IJ SOLN
INTRAMUSCULAR | Status: AC
  Filled 2022-04-07: qty 2

## 2022-04-07 MED ORDER — LIDOCAINE HCL 1 % IJ SOLN
INTRAMUSCULAR | Status: AC
  Filled 2022-04-07: qty 20

## 2022-04-07 MED ORDER — FENTANYL 2500MCG IN NS 250ML (10MCG/ML) PREMIX INFUSION
25.0000 ug/h | INTRAVENOUS | Status: DC
  Administered 2022-04-07: 150 ug/h via INTRAVENOUS
  Administered 2022-04-07 – 2022-04-08 (×2): 100 ug/h via INTRAVENOUS
  Administered 2022-04-09: 125 ug/h via INTRAVENOUS
  Filled 2022-04-07 (×4): qty 250

## 2022-04-07 MED ORDER — NITROGLYCERIN 1 MG/10 ML FOR IR/CATH LAB
INTRA_ARTERIAL | Status: AC
  Filled 2022-04-07: qty 10

## 2022-04-07 MED ORDER — SODIUM CHLORIDE 0.9 % WEIGHT BASED INFUSION
1.0000 mL/kg/h | INTRAVENOUS | Status: DC
  Administered 2022-04-07: 1 mL/kg/h via INTRAVENOUS

## 2022-04-07 MED ORDER — ORAL CARE MOUTH RINSE
15.0000 mL | OROMUCOSAL | Status: DC
  Administered 2022-04-07 – 2022-04-11 (×40): 15 mL via OROMUCOSAL

## 2022-04-07 MED ORDER — CANGRELOR TETRASODIUM 50 MG IV SOLR
INTRAVENOUS | Status: AC
  Filled 2022-04-07: qty 50

## 2022-04-07 MED ORDER — POLYETHYLENE GLYCOL 3350 17 G PO PACK: 17.0000 g | PACK | Freq: Every day | ORAL | Status: DC | PRN

## 2022-04-07 MED ORDER — CHLORHEXIDINE GLUCONATE CLOTH 2 % EX PADS
6.0000 | MEDICATED_PAD | Freq: Every day | CUTANEOUS | Status: DC
  Administered 2022-04-07 – 2022-04-11 (×5): 6 via TOPICAL

## 2022-04-07 MED ORDER — NITROGLYCERIN 0.4 MG SL SUBL: 0.4000 mg | SUBLINGUAL_TABLET | SUBLINGUAL | Status: DC | PRN

## 2022-04-07 MED ORDER — HEPARIN SODIUM (PORCINE) 1000 UNIT/ML IJ SOLN
INTRAMUSCULAR | Status: AC
  Filled 2022-04-07: qty 10

## 2022-04-07 MED ORDER — HEPARIN (PORCINE) IN NACL 1000-0.9 UT/500ML-% IV SOLN
INTRAVENOUS | Status: DC | PRN
  Administered 2022-04-07: 500 mL

## 2022-04-07 MED ORDER — CARVEDILOL 6.25 MG PO TABS
6.2500 mg | ORAL_TABLET | Freq: Two times a day (BID) | ORAL | Status: DC
  Filled 2022-04-07: qty 1

## 2022-04-07 MED ORDER — HYDRALAZINE HCL 20 MG/ML IJ SOLN: 10.0000 mg | INTRAMUSCULAR | Status: AC | PRN

## 2022-04-07 MED ORDER — SODIUM CHLORIDE 0.9% FLUSH
3.0000 mL | Freq: Two times a day (BID) | INTRAVENOUS | Status: DC
  Administered 2022-04-07 – 2022-04-11 (×9): 3 mL via INTRAVENOUS

## 2022-04-07 MED ORDER — MAGNESIUM SULFATE 4 GM/100ML IV SOLN
4.0000 g | Freq: Once | INTRAVENOUS | Status: AC
  Administered 2022-04-07: 4 g via INTRAVENOUS
  Filled 2022-04-07: qty 100

## 2022-04-07 MED ORDER — SODIUM CHLORIDE 0.9 % IV SOLN
3.0000 g | Freq: Four times a day (QID) | INTRAVENOUS | Status: DC
  Administered 2022-04-07 – 2022-04-11 (×18): 3 g via INTRAVENOUS
  Filled 2022-04-07 (×2): qty 8
  Filled 2022-04-07: qty 3
  Filled 2022-04-07: qty 8
  Filled 2022-04-07 (×3): qty 3
  Filled 2022-04-07 (×2): qty 8
  Filled 2022-04-07: qty 3
  Filled 2022-04-07: qty 8
  Filled 2022-04-07: qty 3
  Filled 2022-04-07 (×6): qty 8
  Filled 2022-04-07 (×2): qty 3
  Filled 2022-04-07: qty 8

## 2022-04-07 MED ORDER — LABETALOL HCL 5 MG/ML IV SOLN: 10.0000 mg | INTRAVENOUS | Status: AC | PRN

## 2022-04-07 MED ORDER — SODIUM CHLORIDE 0.9 % IV SOLN
4.0000 ug/kg/min | INTRAVENOUS | Status: DC
  Filled 2022-04-07 (×2): qty 50

## 2022-04-07 MED ORDER — IOHEXOL 300 MG/ML  SOLN
INTRAMUSCULAR | Status: DC | PRN
  Administered 2022-04-07: 180 mL

## 2022-04-07 MED ORDER — MIDAZOLAM HCL 2 MG/2ML IJ SOLN: 2.0000 mg | Freq: Once | INTRAMUSCULAR | Status: AC

## 2022-04-07 MED ORDER — POLYVINYL ALCOHOL 1.4 % OP SOLN: 1.0000 [drp] | OPHTHALMIC | Status: DC | PRN

## 2022-04-07 MED ORDER — B COMPLEX-C PO TABS
1.0000 | ORAL_TABLET | ORAL | Status: DC
  Administered 2022-04-07: 1 via ORAL
  Filled 2022-04-07: qty 1

## 2022-04-07 MED ORDER — SODIUM CHLORIDE 0.9 % IV SOLN
INTRAVENOUS | Status: AC | PRN
  Administered 2022-04-07: 4 ug/kg/min via INTRAVENOUS

## 2022-04-07 MED ORDER — HEPARIN (PORCINE) IN NACL 2000-0.9 UNIT/L-% IV SOLN
INTRAVENOUS | Status: DC | PRN
  Administered 2022-04-07: 1000 mL

## 2022-04-07 MED ORDER — ATORVASTATIN CALCIUM 20 MG PO TABS
80.0000 mg | ORAL_TABLET | Freq: Every day | ORAL | Status: DC
Start: 2022-04-07 — End: 2022-04-08
  Administered 2022-04-07 – 2022-04-08 (×2): 80 mg via ORAL
  Filled 2022-04-07 (×2): qty 4

## 2022-04-07 MED ORDER — SODIUM CHLORIDE 0.9 % IV SOLN: INTRAVENOUS | Status: DC

## 2022-04-07 MED ORDER — HEPARIN SODIUM (PORCINE) 1000 UNIT/ML IJ SOLN
INTRAMUSCULAR | Status: DC | PRN
  Administered 2022-04-07: 8000 [IU] via INTRAVENOUS
  Administered 2022-04-07: 3000 [IU] via INTRAVENOUS

## 2022-04-07 MED ORDER — VERAPAMIL HCL 2.5 MG/ML IV SOLN
INTRAVENOUS | Status: DC | PRN
  Administered 2022-04-07: 2.5 mg via INTRA_ARTERIAL

## 2022-04-07 MED ORDER — LIDOCAINE HCL (PF) 1 % IJ SOLN
INTRAMUSCULAR | Status: DC | PRN
  Administered 2022-04-07: 2 mL

## 2022-04-07 MED ORDER — MIDAZOLAM HCL 2 MG/2ML IJ SOLN
INTRAMUSCULAR | Status: DC | PRN
  Administered 2022-04-07 (×2): 2 mg via INTRAVENOUS

## 2022-04-07 MED ORDER — CHLORHEXIDINE GLUCONATE 0.12% ORAL RINSE (MEDLINE KIT)
15.0000 mL | Freq: Two times a day (BID) | OROMUCOSAL | Status: DC
  Administered 2022-04-07 – 2022-04-11 (×8): 15 mL via OROMUCOSAL

## 2022-04-07 MED ORDER — NOREPINEPHRINE 16 MG/250ML-% IV SOLN
0.0000 ug/min | INTRAVENOUS | Status: DC
  Administered 2022-04-07: 24 ug/min via INTRAVENOUS
  Administered 2022-04-07: 4 ug/min via INTRAVENOUS
  Filled 2022-04-07 (×3): qty 250

## 2022-04-07 MED ORDER — SALINE SPRAY 0.65 % NA SOLN
2.0000 | Freq: Two times a day (BID) | NASAL | Status: DC
  Administered 2022-04-07 – 2022-04-11 (×7): 2 via NASAL
  Filled 2022-04-07: qty 44

## 2022-04-07 MED ORDER — CANGRELOR BOLUS VIA INFUSION
INTRAVENOUS | Status: DC | PRN
  Administered 2022-04-07: 3495 ug via INTRAVENOUS

## 2022-04-07 MED ORDER — HEPARIN (PORCINE) IN NACL 1000-0.9 UT/500ML-% IV SOLN
INTRAVENOUS | Status: AC
  Filled 2022-04-07: qty 500

## 2022-04-07 MED ORDER — ASPIRIN 300 MG RE SUPP
300.0000 mg | Freq: Once | RECTAL | Status: DC
  Filled 2022-04-07: qty 1

## 2022-04-07 MED ORDER — NOREPINEPHRINE 4 MG/250ML-% IV SOLN: 0.0000 ug/min | INTRAVENOUS | Status: DC

## 2022-04-07 MED ORDER — CLOPIDOGREL BISULFATE 75 MG PO TABS
75.0000 mg | ORAL_TABLET | Freq: Every day | ORAL | Status: DC
  Administered 2022-04-08: 75 mg via ORAL
  Filled 2022-04-07: qty 1

## 2022-04-07 MED ORDER — DOCUSATE SODIUM 50 MG/5ML PO LIQD
100.0000 mg | Freq: Two times a day (BID) | ORAL | Status: DC
  Administered 2022-04-07 – 2022-04-11 (×9): 100 mg
  Filled 2022-04-07 (×9): qty 10

## 2022-04-07 MED ORDER — NOREPINEPHRINE 4 MG/250ML-% IV SOLN
INTRAVENOUS | Status: AC
  Administered 2022-04-07: 10 ug/min via INTRAVENOUS
  Filled 2022-04-07: qty 250

## 2022-04-07 MED ORDER — HEPARIN SODIUM (PORCINE) 5000 UNIT/ML IJ SOLN
4000.0000 [IU] | Freq: Once | INTRAMUSCULAR | Status: AC
  Administered 2022-04-07: 4000 [IU] via INTRAVENOUS
  Filled 2022-04-07: qty 1

## 2022-04-07 MED ORDER — ALBUTEROL SULFATE (2.5 MG/3ML) 0.083% IN NEBU: 2.5000 mg | INHALATION_SOLUTION | Freq: Four times a day (QID) | RESPIRATORY_TRACT | Status: DC | PRN

## 2022-04-07 MED ORDER — VASOPRESSIN 20 UNITS/100 ML INFUSION FOR SHOCK
0.0000 [IU]/min | INTRAVENOUS | Status: DC
  Administered 2022-04-07: 0.03 [IU]/min via INTRAVENOUS
  Filled 2022-04-07 (×2): qty 100

## 2022-04-07 MED ORDER — SODIUM CHLORIDE 0.9% FLUSH: 3.0000 mL | INTRAVENOUS | Status: DC | PRN

## 2022-04-07 MED ORDER — FLUTICASONE PROPIONATE 50 MCG/ACT NA SUSP: 1.0000 | Freq: Every day | NASAL | Status: DC | PRN

## 2022-04-07 MED ORDER — HYDRALAZINE HCL 50 MG PO TABS: 50.0000 mg | ORAL_TABLET | Freq: Two times a day (BID) | ORAL | Status: DC

## 2022-04-07 MED ORDER — FENTANYL BOLUS VIA INFUSION
25.0000 ug | INTRAVENOUS | Status: DC | PRN
  Administered 2022-04-07 – 2022-04-08 (×6): 100 ug via INTRAVENOUS
  Filled 2022-04-07: qty 100

## 2022-04-07 MED ORDER — CLOPIDOGREL BISULFATE 75 MG PO TABS
600.0000 mg | ORAL_TABLET | Freq: Once | ORAL | Status: AC
  Administered 2022-04-07: 600 mg via ORAL
  Filled 2022-04-07 (×2): qty 8

## 2022-04-07 MED ORDER — PANTOPRAZOLE SODIUM 40 MG PO TBEC
40.0000 mg | DELAYED_RELEASE_TABLET | Freq: Every evening | ORAL | Status: DC
  Administered 2022-04-07: 40 mg via ORAL
  Filled 2022-04-07: qty 1

## 2022-04-07 MED ORDER — IPRATROPIUM BROMIDE 0.06 % NA SOLN
2.0000 | Freq: Three times a day (TID) | NASAL | Status: DC
  Administered 2022-04-07 – 2022-04-11 (×12): 2 via NASAL
  Filled 2022-04-07: qty 15

## 2022-04-07 MED ORDER — RIVAROXABAN 20 MG PO TABS
20.0000 mg | ORAL_TABLET | Freq: Every day | ORAL | Status: DC
  Administered 2022-04-07: 20 mg via ORAL
  Filled 2022-04-07 (×2): qty 1

## 2022-04-07 MED ORDER — ACETAMINOPHEN 325 MG PO TABS: 650.0000 mg | ORAL_TABLET | ORAL | Status: DC | PRN

## 2022-04-07 MED ORDER — SODIUM CHLORIDE 0.9 % IV SOLN: 250.0000 mL | INTRAVENOUS | Status: DC | PRN

## 2022-04-07 SURGICAL SUPPLY — 24 items
BALLN TREK RX 2.25X15 (BALLOONS) ×2
BALLN ~~LOC~~ EUPHORA RX 2.5X20 (BALLOONS) ×2
BALLOON TREK RX 2.25X15 (BALLOONS) IMPLANT
BALLOON ~~LOC~~ EUPHORA RX 2.5X20 (BALLOONS) IMPLANT
CATH 5FR PIGTAIL DIAGNOSTIC (CATHETERS) ×1 IMPLANT
CATH INFINITI 5 FR JL3.5 (CATHETERS) ×1 IMPLANT
CATH INFINITI 5FR AL1 (CATHETERS) ×1 IMPLANT
CATH INFINITI 5FR JK (CATHETERS) ×1 IMPLANT
CATH INFINITI JR4 5F (CATHETERS) ×1 IMPLANT
CATH VISTA GUIDE 6FR MPA1 (CATHETERS) ×1 IMPLANT
CATH VISTA GUIDE 6FR XBLAD3.5 (CATHETERS) ×1 IMPLANT
DEVICE RAD TR BAND REGULAR (VASCULAR PRODUCTS) ×1 IMPLANT
DRAPE BRACHIAL (DRAPES) ×1 IMPLANT
GLIDESHEATH SLEND SS 6F .021 (SHEATH) ×1 IMPLANT
GUIDEWIRE INQWIRE 1.5J.035X260 (WIRE) IMPLANT
INQWIRE 1.5J .035X260CM (WIRE) ×2
KIT ENCORE 26 ADVANTAGE (KITS) ×1 IMPLANT
PACK CARDIAC CATH (CUSTOM PROCEDURE TRAY) ×3 IMPLANT
PROTECTION STATION PRESSURIZED (MISCELLANEOUS) ×2
SET ATX SIMPLICITY (MISCELLANEOUS) ×1 IMPLANT
STATION PROTECTION PRESSURIZED (MISCELLANEOUS) IMPLANT
STENT ONYX FRONTIER 2.25X26 (Permanent Stent) ×1 IMPLANT
WIRE ASAHI PROWATER 180CM (WIRE) ×1 IMPLANT
WIRE EMERALD ST .035X150CM (WIRE) ×1 IMPLANT

## 2022-04-07 NOTE — Progress Notes (Signed)
Pt was transported to MRI from CCU and back while on the vent. ?

## 2022-04-07 NOTE — Procedures (Signed)
Central Venous Catheter Insertion Procedure Note ? ?Jason Neal  ?[id number]  ?May 20, 1951 ? ?Date:04/07/22  ?Time:5:41 AM  ? ?Provider Performing:Bama Hanselman A Ajee Heasley  ? ?Procedure: Insertion of Non-tunneled Central Venous Catheter(36556) with US guidance (50932)  ? ?Indication(s) ?Medication administration and Difficult access ? ?Consent ?Unable to obtain consent due to emergent nature of procedure. ? ?Anesthesia ?Topical only with 1% lidocaine  ? ?Timeout ?Verified patient identification, verified procedure, site/side was marked, verified correct patient position, special equipment/implants available, medications/allergies/relevant history reviewed, required imaging and test results available. ? ?Sterile Technique ?Maximal sterile technique including full sterile barrier drape, hand hygiene, sterile gown, sterile gloves, mask, hair covering, sterile ultrasound probe cover (if used). ? ?Procedure Description ?Area of catheter insertion was cleaned with chlorhexidine and draped in sterile fashion.  With real-time ultrasound guidance a central venous catheter was placed into the right internal jugular vein. Nonpulsatile blood flow and easy flushing noted in all ports.  The catheter was sutured in place and sterile dressing applied. ? ?Complications/Tolerance ?None; patient tolerated the procedure well. ?Chest X-ray is ordered to verify placement for internal jugular or subclavian cannulation.   Chest x-ray is not ordered for femoral cannulation. ? ?EBL ?Minimal ? ?Specimen(s) ?None ? ?[name], DNP, CCRN, FNP-C, AGACNP-BC ?Acute Care & Family Nurse Practitioner  ?[facility]  ?See Amion for personal pager ?PCCM on call pager [phone] until 7 am ? ? ?

## 2022-04-07 NOTE — Plan of Care (Signed)
? ?  Interdisciplinary Goals of Care Family Meeting ? ? ?Date carried out: 04/07/2022 ? ?Location of the meeting: Bedside ? ?Member's involved: Physician, Bedside Registered Nurse, and Family Member or next of kin ? ?Code status: Full Code ? ?Disposition: Continue current acute care ? ? ? ? ? ?GOALS OF CARE DISCUSSION ? ?The Clinical status was relayed to family in detail- ? ?Updated and notified of patients medical condition- ?Patient remains unresponsive and will not open eyes to command.   ?Signs of brain damage ?Sudden cardiac arrest and multiorgan failure ? ?Explained to family course of therapy and the modalities  ? ?Patient with Progressive multiorgan failure with a very high probablity of a very minimal chance of meaningful recovery despite all aggressive and optimal medical therapy.  ?PATIENT REMAINS FULL CODE ? ?Family understands the situation. ? ? ? ?Family are satisfied with Plan of action and management. All questions answered ? ?Additional CC time 25 mins ? ? ?Corrin Parker, M.D.  ?[facility]  ?Medical Director [place] ?Medical Director [facility] Cardio-Pulmonary Department  ? ? ?

## 2022-04-07 NOTE — IPAL (Signed)
?  Interdisciplinary Goals of Care Family Meeting ? ? ? ?Date carried out: 04/07/2022 ? ?Location of the meeting: Bedside ? ?Member's involved: Physician, Bedside Registered Nurse, and Family Member or next of kin ? ? ?Code status: Full Code ? ?Disposition: Continue current acute care ? ? ? ?GOALS OF CARE DISCUSSION ? ?The Clinical status was relayed to family in detail- ? ?Updated and notified of patients medical condition- ?Patient remains unresponsive and will not open eyes to command.   ?Patient is having a weak cough and struggling to remove secretions.   ?Patient with increased WOB and using accessory muscles to breathe ?Explained to family course of therapy and the modalities  ? ?Patient with Progressive multiorgan failure with a very high probablity of a very minimal chance of meaningful recovery despite all aggressive and optimal medical therapy.  ?PATIENT REMAINS FULL CODE ?SUDDEN CARDIAC ARREST FROM STEMI ?PROGNOSIS IS VERY GUARDED ? ?Family understands the situation. ? ? ? ?Family are satisfied with Plan of action and management. All questions answered ? ?Additional CC time 25 mins ? ? ?Corrin Parker, M.D.  ?[facility]  ?Medical Director [place] ?Medical Director [facility] Cardio-Pulmonary Department  ? ? ?

## 2022-04-07 NOTE — ED Notes (Signed)
PT GIVEN '20MG'$  ETOMIDATE AT 00:00 AND '100MG'$  ROCCURONIUM AT 00:00. ? ?25 AT THE TEETH ? ? ?

## 2022-04-07 NOTE — Progress Notes (Signed)
? ?Progress Note ? ?Patient Name: Jason Neal ?Date of Encounter: 04/07/2022 ? ?[facility] Cardiologist: Quay Burow, MD  ? ?Subjective  ? ?Family at the bedside, recent events discussed ?Please see consult for complete details. ?Wife reports he became unresponsive around 11 PM, fire department responded relatively quickly, CPR and resuscitation initiated ?Brought to the catheterization lab last night, stent placed to mid circumflex ?No significant arrhythmia overnight ?Periods of movement and agitation, requiring increased sedation per nursing ? ?Inpatient Medications  ?  ?Scheduled Meds: ? aspirin  81 mg Oral Daily  ? aspirin  300 mg Rectal Once  ? atorvastatin  80 mg Oral Daily  ? B-complex with vitamin C  1 tablet Oral QODAY  ? carvedilol  6.25 mg Oral BID WC  ? [START ON 04/08/2022] clopidogrel  75 mg Oral Q breakfast  ? docusate  100 mg Per Tube BID  ? hydrALAZINE  50 mg Oral BID  ? insulin aspart  0-9 Units Subcutaneous Q4H  ? ipratropium  2 spray Each Nare TID  ? pantoprazole  40 mg Oral QPM  ? polyethylene glycol  17 g Per Tube Daily  ? rivaroxaban  20 mg Oral Daily  ? sodium chloride  2 spray Each Nare BID  ? sodium chloride flush  3 mL Intravenous Q12H  ? ?Continuous Infusions: ? sodium chloride 10 mL/hr at 04/07/22 0012  ? sodium chloride    ? sodium chloride 1 mL/kg/hr (04/07/22 0528)  ? ampicillin-sulbactam (UNASYN) IV Stopped (04/07/22 0700)  ? fentaNYL infusion INTRAVENOUS 200 mcg/hr (04/07/22 0751)  ? norepinephrine (LEVOPHED) Adult infusion 21 mcg/min (04/07/22 0751)  ? propofol (DIPRIVAN) infusion 30 mcg/kg/min (04/07/22 0851)  ? ?PRN Meds: ?sodium chloride, acetaminophen, albuterol, fentaNYL, fluticasone, nitroGLYCERIN, ondansetron (ZOFRAN) IV, polyvinyl alcohol, sodium chloride flush  ? ?Vital Signs  ?  ?Vitals:  ? 04/07/22 0615 04/07/22 0630 04/07/22 0645 04/07/22 0700  ?BP:  102/66  94/68  ?Pulse: (!) 54 (!) 55 (!) 55 (!) 56  ?Resp: '20 20 20 20  '$ ?Temp: (!) 96.4 ?F (35.8 ?C) (!) 96.6  ?F (35.9 ?C) (!) 96.6 ?F (35.9 ?C) (!) 96.6 ?F (35.9 ?C)  ?SpO2: 100% 100% 100% 100%  ?Weight:      ?Height:      ? ? ?Intake/Output Summary (Last 24 hours) at 04/07/2022 0918 ?Last data filed at 04/07/2022 0751 ?Gross per 24 hour  ?Intake 376.13 ml  ?Output 130 ml  ?Net 246.13 ml  ? ? ?  04/06/2022  ? 11:57 PM 08/02/2021  ?  7:51 AM 06/06/2021  ?  8:46 AM  ?Last 3 Weights  ?Weight (lbs) 256 lb 13.4 oz 238 lb 234 lb 9.6 oz  ?Weight (kg) 116.5 kg 107.956 kg 106.414 kg  ?   ? ?Telemetry  ?  ?Normal sinus rhythm- Personally Reviewed ? ?ECG  ?  ?- Personally Reviewed ? ?Physical Exam  ? ?GEN: Intubated sedated ?Neck: No JVD ?Cardiac: RRR, no murmurs, rubs, or gallops.  ?Respiratory: Clear to auscultation bilaterally. ?GI: Soft, nontender, non-distended  ?MS: No edema; No deformity. ?Neuro: Unable to test ?Psych: Unresponsive ? ?Labs  ?  ?High Sensitivity Troponin:   ?Recent Labs  ?Lab 04/06/22 ?2359 04/07/22 ?0423 04/07/22 ?0865  ?TROPONINIHS 110* 21,580* >24,000*  ?   ?Chemistry ?Recent Labs  ?Lab 04/06/22 ?2359 04/07/22 ?0423  ?NA 139 136  ?K 3.5 4.6  ?CL 106 110  ?CO2 17* 21*  ?GLUCOSE 273* 233*  ?BUN 31* 34*  ?CREATININE 1.74* 1.81*  ?CALCIUM 8.1* 7.8*  ?  PROT 6.8  --   ?ALBUMIN 3.5  --   ?AST 218*  --   ?ALT 184*  --   ?ALKPHOS 50  --   ?BILITOT 0.4  --   ?GFRNONAA 41* 39*  ?ANIONGAP 16* 5  ?  ?Lipids  ?Recent Labs  ?Lab 04/06/22 ?2359  ?CHOL 140  ?TRIG 221*  ?HDL 34*  ?[place] 62  ?CHOLHDL 4.1  ?  ?Hematology ?Recent Labs  ?Lab 04/06/22 ?2359 04/07/22 ?0423  ?WBC 12.7* 11.0*  ?RBC 4.44 4.04*  ?HGB 12.1* 10.8*  ?HCT 38.8* 34.0*  ?MCV 87.4 84.2  ?MCH 27.3 26.7  ?MCHC 31.2 31.8  ?RDW 15.5 15.4  ?PLT 197 218  ? ?Thyroid No results for input(s): TSH, FREET4 in the last 168 hours.  ?BNPNo results for input(s): BNP, PROBNP in the last 168 hours.  ?DDimer No results for input(s): DDIMER in the last 168 hours.  ? ?Radiology  ?  ?DG Abd 1 View ? ?Result Date: 04/07/2022 ?CLINICAL DATA:  NG tube placement. EXAM: ABDOMEN - 1 VIEW  COMPARISON:  Earlier same day FINDINGS: 0522 hours. NG tube tip is in the distal stomach. Nonspecific bowel gas pattern. IMPRESSION: NG tube tip is in the distal stomach. Electronically Signed   By: Misty Stanley M.D.   On: 04/07/2022 05:49  ? ?DG Abd 1 View ? ?Result Date: 04/07/2022 ?CLINICAL DATA:  71 year old male central line and enteric tube placement. EXAM: ABDOMEN - 1 VIEW COMPARISON:  Portable chest at the same time. Prior abdominal film 05/24/2006. FINDINGS: Portable AP semi upright view at 0418 hours. Enteric tube tip courses to the GEJ, but the tube appears to be proximal to the stomach which is moderately distended with gas. Gas-filled but nondilated small bowel throughout the visible lower abdomen. No acute osseous abnormality identified. IMPRESSION: 1. Enteric tube tip at the GEJ. Advance 8 cm to ensure side hole placement within the stomach. 2. Moderately distended stomach with gas. Gas-filled small bowel throughout the abdomen. Electronically Signed   By: Genevie Ann M.D.   On: 04/07/2022 04:49  ? ?CARDIAC CATHETERIZATION ? ?Result Date: 04/07/2022 ?  CULPRIT LESION: Prox Cx to Mid Cx lesion is 90% stenosed.  Proximal to previously placed stent   A drug-eluting stent was successfully placed (overlapping previously placed stent) using a STENT ONYX FRONTIER 2.25X26.  Postdilated to 2.6 mm   Post intervention, there is a 0% residual stenosis.   Previously placed Mid Cx to Dist Cx stent (drug-eluting stent) is  widely patent.   ----------------------------------------------------   Mid LAD lesion is 35% stenosed.   1st Diag lesion is 55% stenosed.  2nd Diag lesion is 70% stenosed.   Previously placed Prox RCA stent (drug-eluting stent) is  widely patent.   Mid RCA to Dist RCA lesion is 35% stenosed. SUMMARY 3-4-vessel CAD with Culprit Lesion Being 103 to 90% mid LCx stenosis just prior to previously placed stent; widely patent RCA stent with mild diffuse disease distally, diffuse 50 to 65% stenosis in large  1st Diag and focal 70% lesion in small 2nd Diag (not PCI target) Successful DES PCI of mid LCx lesion reducing from 85-90% to 0% with a Onyx Frontier DES 2.25 mm x 22 mm postdilated to 2.6 mm (overlaps previous stent); TIMI-3 flow pre and post Unable to cross aortic valve with multiple different catheters.  He has known mild aortic stenosis but also tortuous aortic root.  Very difficult to engage. => We will need echocardiogram RECOMMENDATIONS PCCM to admit to ICU  for vent management.  ABG in the Cath Lab was relatively normal with pH of 7.35 and adequate PaO2/PCO2. On Kengreal infusion upon leaving the Cath Lab.  Will need OG tube placed to give Plavix and then discontinue Kengreal. Gentle hydration for contrast =-> more than anticipated due to difficulty engaging RCA. Hold ARB until renal function stable Check 2D echo Restart beta-blocker and hydralazine, and increase atorvastatin 80 mg Monitor for neurologic recovery Glenetta Hew, MD ? ?DG Chest Port 1 View ? ?Result Date: 04/07/2022 ?CLINICAL DATA:  71 year old male central line and enteric tube placement. EXAM: PORTABLE CHEST 1 VIEW COMPARISON:  Portable chest 0017 hours today. FINDINGS: Portable AP semi upright view at 0417 hours. New right IJ approach central venous catheter tip projects below the level of the carina at the lower SVC or cavoatrial junction level. No pneumothorax. Endotracheal tube tip is stable at the level the clavicles. Enteric tube courses to the abdomen, tip not included. Continued low lung volumes with confluent perihilar and upper lung opacity greater on the right. Stable cardiac size and mediastinal contours. No pleural effusion is evident. IMPRESSION: 1. New right IJ approach central venous catheter with tip at the lower SVC or cavoatrial junction level. No pneumothorax. 2. Stable ET tube at the level the clavicles. Enteric tube courses to the abdomen, tip not included. 3. Stable ventilation since 0017 hours. No new cardiopulmonary  abnormality. Electronically Signed   By: Genevie Ann M.D.   On: 04/07/2022 04:48  ? ?DG Chest Port 1 View ? ?Result Date: 04/07/2022 ?CLINICAL DATA:  Tube placement EXAM: PORTABLE CHEST 1 VIEW COMPARISON:  03/25/

## 2022-04-07 NOTE — ED Notes (Signed)
CARDIOLOGY IN ROOM NOW, AND PT ON THE WAY TO CATH LAB ?

## 2022-04-07 NOTE — Procedures (Signed)
Arterial Catheter Insertion Procedure Note ? ?Alva Neal  ?[id number]  ?1951-07-01 ? ?Date:04/07/22  ?Time:5:42 AM  ? ?Provider Performing: Karen Kays  ? ?Procedure: Insertion of Arterial Line [phone]) with US guidance (81275)  ? ?Indication(s) ?Blood pressure monitoring and/or need for frequent ABGs ? ?Consent ?Risks of the procedure as well as the alternatives and risks of each were explained to the patient and/or caregiver.  Consent for the procedure was obtained and is signed in the bedside chart ? ?Anesthesia ?None ? ?Time Out ?Verified patient identification, verified procedure, site/side was marked, verified correct patient position, special equipment/implants available, medications/allergies/relevant history reviewed, required imaging and test results available. ? ?Sterile Technique ?Maximal sterile technique including full sterile barrier drape, hand hygiene, sterile gown, sterile gloves, mask, hair covering, sterile ultrasound probe cover (if used). ? ?Procedure Description ?Area of catheter insertion was cleaned with chlorhexidine and draped in sterile fashion. With real-time ultrasound guidance an arterial catheter was placed into the left radial artery.  Appropriate arterial tracings confirmed on monitor.   ? ?Complications/Tolerance ?None; patient tolerated the procedure well. ? ?EBL ?Minimal ? ?Specimen(s) ?None ? ? ?[name], DNP, CCRN, FNP-C, AGACNP-BC ?Acute Care & Family Nurse Practitioner  ?[facility]  ?See Amion for personal pager ?PCCM on call pager [phone] until 7 am ? ? ?

## 2022-04-07 NOTE — Consult Note (Signed)
?Cardiology Consultation:  ? ?Patient ID: Jason Neal ?MRN: [id number]; DOB: [birth year] ? ?Admit date: 04/06/2022 ?Date of Consult: 04/07/2022 ? ?PCP:  Kirk Ruths, MD ?  ?[facility] Providers ?Cardiologist:  Quay Burow, MD   { ?   ? ?Patient Profile:  ? ?Jason Neal is a moderately obese 71 y.o. male with a hx of CAD with history of PCI to the LCx and RCA back in 2011 along with PAF and mild aortic stenosis, along with HTN, HLD and DM-2.  He is being seen morning of 04/07/2022 for the evaluation of lateral ST elevation MI status post cardiopulmonary arrest with resuscitation at the request of Dr. Karma Greaser ([place]). ? ?History of Present Illness:  ? ?Mr. Ragas is a longstanding patient of Dr. Quay Burow who initially had PCI presented back in 2011 of the LCx and RCA.  There was no complication with dissection requiring transfer to [facility] on balloon pump.  He had several days on balloon pump and recovered.  Has done well but developed PAF.  Last cath in 2013 showed patent stents.  Last seen by Dr. Gwenlyn Found August 2022, was doing well.. ? ?Was in his usual state of health until the evening of 04/06/2022.  He and his family were watching television and he fell asleep in the chair.  His wife noted that he started gurgling and became apneic.  She went to wake him up.  He initially responded and then again became apneic.  911 was called. According to the wife it took 3 to 5 minutes for EMS to get there.  Report from APP was that there was bystander CPR being done.  She said that she was unable to get him off of the chair.  Presumably 6 minutes of CPR was performed prior to ALS arrival.  He then apparently had 1 shock after initially being in PEA.  Several rounds of epinephrine.  After the shock did restore sinus rhythm.  Intubated with a [facility] airway in the field.  Was then converted from [facility] airway to ETT upon arrival to [facility] ER leading to time delay.  There is also some confusion about his previous  cardiac events that need to be clarified with the family before he was taken to the Cath Lab.  He did remain hemodynamically stable in the ER in sinus rhythm with stable blood pressures intubated and sedated. ? ?He was given aspirin rectally along with 4000 units of IV heparin in the ER. ? ? ?Past Medical History:  ?Diagnosis Date  ? Allergies   ? HAS ALBUTEROL INHALER  ? Aortic valve stenosis   ? mild  ? Brain bleed ([place]) 08/2018  ? DUE TO FALL AND HIT HEAD-BLEED RESOLVED ON ITS OWN  ? CAD (coronary artery disease)   ? prior CFX/RCA PCI 2011, cath [facility] 3/13  ? Cancer [facility]) 2017  ? MELANOMA RIGHT EAR  ? Carotid bruit 03/14/11  ? CAROTID DUPLEX DOPPLER-Right bulb; demonstrated a mild amount of fibrous plaque w/o evidence of a significant diameter reduction, dissection or any other vascular abnormality.;Left ICA;-proximal, mid and distal segments of the internal carotid artery normal patency.mid and distal segments demonstrating moderate tortuosity..Mildly abnormal carotid duplex scan.  ? Chronic renal insufficiency, stage III (moderate) (HCC)   ? DJD (degenerative joint disease)   ? c-spine  ? Dyslipidemia   ? Dysrhythmia   ? GERD (gastroesophageal reflux disease)   ? Heart murmur   ? History of kidney stones   ? X1  ?  Hypercholesteremia   ? Hypertension   ? PAF (paroxysmal atrial fibrillation) [facility]) March 2013  ? Sinus headache   ? Sleep apnea   ? on C-Pap  ? Type 2 diabetes mellitus with chronic kidney disease ([place])   ? diet controlled  ? ? ?Past Surgical History:  ?Procedure Laterality Date  ? C-spine surgery  2001  ? cardiac catherization  03/04/12  ? Dr Gwenlyn Found  ? CARDIOVERSION  03/07/2012  ? Procedure: CARDIOVERSION;  Surgeon: Sanda Klein, MD;  Location: [facility] ENDOSCOPY;  Service: Cardiovascular;  Laterality: N/A;  ? CATARACT EXTRACTION W/PHACO Right 06/25/2019  ? Procedure: CATARACT EXTRACTION PHACO AND INTRAOCULAR LENS PLACEMENT ([place]) RIGHT;  Surgeon: Leandrew Koyanagi, MD;  Location: [place];   Service: Ophthalmology;  Laterality: Right;  Diabetic - oral meds ?sleep apnea  ? CATARACT EXTRACTION W/PHACO Left 07/23/2019  ? Procedure: CATARACT EXTRACTION PHACO AND INTRAOCULAR LENS PLACEMENT ([place]) LEFT DIABETIC;  Surgeon: Leandrew Koyanagi, MD;  Location: [place];  Service: Ophthalmology;  Laterality: Left;  Diabetic - oral meds  ? COLONOSCOPY WITH PROPOFOL N/A 01/05/2017  ? Procedure: COLONOSCOPY WITH PROPOFOL;  Surgeon: Lollie Sails, MD;  Location: [facility] ENDOSCOPY;  Service: Endoscopy;  Laterality: N/A;  ? CORONARY ANGIOPLASTY WITH STENT PLACEMENT  Dec 2011  ? [place]  ? HERNIA REPAIR  1999  ? INGUINAL HERNIA REPAIR Left 12/17/2019  ? Procedure: HERNIA REPAIR INGUINAL ADULT;  Surgeon: Robert Bellow, MD;  Location: [facility] ORS;  Service: General;  Laterality: Left;  ? LEFT HEART CATHETERIZATION WITH CORONARY ANGIOGRAM N/A 03/04/2012  ? Procedure: LEFT HEART CATHETERIZATION WITH CORONARY ANGIOGRAM;  Surgeon: Lorretta Harp, MD;  Location: [facility] CATH LAB;  Service: Cardiovascular;  Laterality: N/A;  ? TEE WITHOUT CARDIOVERSION  03/07/2012  ? Procedure: TRANSESOPHAGEAL ECHOCARDIOGRAM (TEE);  Surgeon: Sanda Klein, MD;  Location: [place];  Service: Cardiovascular;  Laterality: N/A;  ?  ? ?Home Medications:  ?Prior to Admission medications   ?Medication Sig Start Date End Date Taking? Authorizing Provider  ?ACCU-CHEK AVIVA PLUS test strip  02/17/13   [provider]  ?albuterol (VENTOLIN HFA) 108 (90 Base) MCG/ACT inhaler Inhale 1-2 puffs into the lungs every 6 (six) hours as needed for wheezing or shortness of breath.    [provider]  ?atorvastatin (LIPITOR) 40 MG tablet Take 40 mg by mouth every evening.     [provider]  ?B Complex-C (B-COMPLEX WITH VITAMIN C) tablet Take 1 tablet by mouth every other day.     [provider]  ?carvedilol (COREG) 6.25 MG tablet Take 6.25 mg by mouth 2 (two) times daily.  12/10/15   [provider]   ?cetirizine (ZYRTEC) 10 MG tablet Take 10 mg by mouth every evening.    [provider]  ?cholecalciferol (VITAMIN D) 1000 UNITS tablet Take 1,000-2,000 Units by mouth See admin instructions. Take 1000 units in the morning and 2000 units in the evening    [provider]  ?diltiazem (CARDIZEM CD) 180 MG 24 hr capsule Take 180 mg by mouth every morning.  04/02/19   [provider]  ?fluticasone (FLONASE) 50 MCG/ACT nasal spray Place 1 spray into both nostrils daily as needed for allergies.  12/10/15   [provider]  ?glipiZIDE-metformin (METAGLIP) 2.5-500 MG tablet Take 1 tablet by mouth 2 (two) times daily before a meal.    [provider]  ?hydrALAZINE (APRESOLINE) 50 MG tablet Take 50 mg by mouth 2 (two) times daily.    [provider]  ?ipratropium (ATROVENT) 0.06 % nasal spray Place 2 sprays into both nostrils 3 (three) times daily as needed for rhinitis.    [provider]  ?Lancets (ACCU-CHEK MULTICLIX) lancets  03/18/13   [provider]  ?losartan (COZAAR) 100 MG tablet TAKE 1 TABLET DAILY ?Patient taking differently: Take 100 mg by mouth every morning. 09/13/15   Ashok Norris, MD  ?Multiple Vitamin (MULTIVITAMIN WITH MINERALS) TABS tablet Take 1 tablet by mouth every other day.    [provider]  ?nitroGLYCERIN (NITROSTAT) 0.4 MG SL tablet Place 0.4 mg under the tongue every 5 (five) minutes as needed for chest pain.    [provider]  ?ofloxacin (FLOXIN) 0.3 % OTIC solution Place 5 drops into both ears as needed.    [provider]  ?Omega-3 Fatty Acids (FISH OIL) 1200 MG CAPS Take 1,200 mg by mouth daily.    [provider]  ?pantoprazole (PROTONIX) 40 MG tablet Take 40 mg by mouth every evening.     [provider]  ?Polyethyl Glycol-Propyl Glycol 0.4-0.3 % SOLN Place 1 drop into both eyes as needed (dry eyes).     [provider]  ?polyethylene glycol (MIRALAX / GLYCOLAX) 17  g packet Take 17 g by mouth daily as needed.     [provider]  ?rivaroxaban (XARELTO) 20 MG TABS tablet Take 1 tablet (20 mg total) by mouth daily with supper. 12/20/19   Robert Bellow, MD  ?Jacqualyn Posey

## 2022-04-07 NOTE — Progress Notes (Signed)
Neurology consulted for prognostication after cardiac arrest prior to STEMI. On examination (recent 37mg bolus fentanyl with propofol paused) does not follow commands, pupils are reactive, (-) corneals/oculocephalics/cough/gag, no response to noxious stimuli, full body and eyelid myoclonus q 2-3 seconds. Myoclonus abates when propofol resumed. MRI brain shows no definitive e/o anoxic brain injury. Keppra loaded '20mg'$ /kg to be followed by '500mg'$  bid. EEG pending. Plan for exam off sedation tomorrow. Plan d/w family at bedside. Full consult note to follow. ? ?CSu Monks MD ?[facility] ?[phone]? ?If 7pm- 7am, please page neurology on call as listed in [place] ? ?

## 2022-04-07 NOTE — Progress Notes (Signed)
PHARMACY CONSULT NOTE ? ?Pharmacy Consult for Electrolyte Monitoring and Replacement  ? ?Recent Labs: ?Potassium (mmol/L)  ?Date Value  ?04/07/2022 4.4  ? ?Magnesium (mg/dL)  ?Date Value  ?04/07/2022 1.4 (L)  ? ?Calcium (mg/dL)  ?Date Value  ?04/07/2022 8.1 (L)  ? ?Albumin (g/dL)  ?Date Value  ?04/07/2022 3.3 (L)  ? ?Phosphorus (mg/dL)  ?Date Value  ?04/07/2022 3.9  ? ?Sodium (mmol/L)  ?Date Value  ?04/07/2022 141  ? ?Corrected Ca: 8.6 mg/dL ? ?Assessment: 71 y.o with significant PMH of OSA on CPAP, tSAH,T2DM, HTN, HLD, CKD stage III, chronic atrial fibrillation on Xarelto, aortic stenosis, CAD with history of PCI to the LCx and RCA with complicated PCI requiring transfer to [facility] for balloon pump who presented to the ED postcardiac arrest. Pharmacy is asked to follow and replace electrolytes while patient is in CCU ? ?Goal of Therapy:  ?Potassium 4.0 - 5.1 mmol/L ?Magnesium 2.0 - 2.4 mg/dL ?All Other Electrolytes WNL ? ?Plan:  ?4 grams IV magnesium sulfate x 1 ?Recheck electrolytes in am 04/29 ? ?Dallie Piles ,PharmD ?Clinical Pharmacist ?04/07/2022 12:17 PM ? ?

## 2022-04-07 NOTE — Progress Notes (Signed)
Eeg done 

## 2022-04-07 NOTE — ED Provider Notes (Signed)
? ?[facility] ?Provider Note ? ? ? Event Date/Time  ? First MD Initiated Contact with Patient 04/07/22 0003   ?  (approximate) ? ? ?History  ? ?Code STEMI ? ? ?HPI ?Level 5 caveat:  history/ROS limited by acute/critical illness ? ?Jason Neal is a 71 y.o. male whose medical history is most notable for paroxysmal atrial fibrillation on Xarelto and prior ACS with a complicated PCI due to reportedly abnormal cardiac anatomy requiring transfer to [facility]. ? ?He presents tonight after cardiopulmonary arrest at home.  Reportedly the patient was sitting in a recliner gave a strong exhalation of air and stopped breathing.  His family said that he was unresponsive.  They called EMS and first responders arrived within 5 minutes.  They started CPR and gave him 1 defibrillation.  When the paramedics arrived about 6 minutes later they continued CPR and found the patient was in PEA arrest.  After 3 rounds of epinephrine they got a spontaneous return of circulation.  A King airway was placed and he was brought to the emergency department. ? ?Upon arrival to the emergency department he is completely unresponsive but is breathing on his own with assisted BVM respirations.  He has a stable blood pressure in the 130s and a pulse in the 80s. ?  ? ? ?Physical Exam  ? ?Triage Vital Signs: ?ED Triage Vitals  ?Enc Vitals Group  ?   BP 04/06/22 2355 132/76  ?   Pulse Rate 04/06/22 2355 86  ?   Resp 04/07/22 0000 20  ?   Temp --   ?   Temp src --   ?   SpO2 04/06/22 2355 100 %  ?   Weight 04/06/22 2357 116.5 kg (256 lb 13.4 oz)  ?   Height 04/06/22 2357 1.905 m ('6\' 3"'$ )  ?   Head Circumference --   ?   Peak Flow --   ?   Pain Score 04/06/22 2357 0  ?   Pain Loc --   ?   Pain Edu? --   ?   Excl. in [place]? --   ? ? ?Most recent vital signs: ?Vitals:  ? 04/07/22 0223 04/07/22 0312  ?BP: (!) 146/106   ?Pulse: (!) 56   ?Resp: (!) 22   ?SpO2: 100% 100%  ? ? ? ?General: Status postcardiac arrest, spontaneously breathing but  otherwise unresponsive. ?CV:  Good peripheral perfusion.  Normal heart sounds. ?Resp:  Spontaneous respirations with clear lung sounds bilaterally with King airway in place. ?Abd:  Some abdominal distention ?Other:  GCS 3, pupils are contracted bilaterally and unresponsive, no corneal reflex.  No response to painful stimuli. ? ? ?ED Results / Procedures / Treatments  ? ?Labs ?(all labs ordered are listed, but only abnormal results are displayed) ?Labs Reviewed  ?HEMOGLOBIN A1C - Abnormal; Notable for the following components:  ?    Result Value  ? Hgb A1c MFr Bld 7.6 (*)   ? All other components within normal limits  ?CBC WITH DIFFERENTIAL/PLATELET - Abnormal; Notable for the following components:  ? WBC 12.7 (*)   ? Hemoglobin 12.1 (*)   ? HCT 38.8 (*)   ? Lymphs Abs 5.1 (*)   ? Abs Immature Granulocytes 0.36 (*)   ? All other components within normal limits  ?PROTIME-INR - Abnormal; Notable for the following components:  ? Prothrombin Time 19.9 (*)   ? INR 1.7 (*)   ? All other components within normal limits  ?APTT -  Abnormal; Notable for the following components:  ? aPTT 37 (*)   ? All other components within normal limits  ?COMPREHENSIVE METABOLIC PANEL - Abnormal; Notable for the following components:  ? CO2 17 (*)   ? Glucose, Bld 273 (*)   ? BUN 31 (*)   ? Creatinine, Ser 1.74 (*)   ? Calcium 8.1 (*)   ? AST 218 (*)   ? ALT 184 (*)   ? GFR, Estimated 41 (*)   ? Anion gap 16 (*)   ? All other components within normal limits  ?LIPID PANEL - Abnormal; Notable for the following components:  ? Triglycerides 221 (*)   ? HDL 34 (*)   ? VLDL 44 (*)   ? All other components within normal limits  ?URINALYSIS, ROUTINE W REFLEX MICROSCOPIC - Abnormal; Notable for the following components:  ? Color, Urine YELLOW (*)   ? APPearance HAZY (*)   ? Protein, ur 100 (*)   ? Bacteria, UA RARE (*)   ? All other components within normal limits  ?BLOOD GAS, ARTERIAL - Abnormal; Notable for the following components:  ? pO2, Arterial  142 (*)   ? Bicarbonate 19.3 (*)   ? Acid-base deficit 5.6 (*)   ? All other components within normal limits  ?GLUCOSE, CAPILLARY - Abnormal; Notable for the following components:  ? Glucose-Capillary 214 (*)   ? All other components within normal limits  ?TROPONIN I (HIGH SENSITIVITY) - Abnormal; Notable for the following components:  ? Troponin I (High Sensitivity) 110 (*)   ? All other components within normal limits  ?RESP PANEL BY RT-PCR (FLU A&B, COVID) ARPGX2  ?BASIC METABOLIC PANEL  ?CBC  ?BLOOD GAS, ARTERIAL  ?POCT ACTIVATED CLOTTING TIME  ?POCT ACTIVATED CLOTTING TIME  ?POCT ACTIVATED CLOTTING TIME  ?TROPONIN I (HIGH SENSITIVITY)  ? ? ? ?EKG ? ?ED ECG REPORT ?IHinda Kehr, the attending physician, personally viewed and interpreted this ECG. ? ?Date: 04/06/2022 ?EKG Time: 23: 57 ?Rate: 84 ?Rhythm: normal sinus rhythm ?QRS Axis: normal ?Intervals: Left anterior fascicular block ?ST/T Wave abnormalities: ST segment elevation in leads aVL and severe T wave inversion and depression in leads II, 3, aVF, V4, V5, V6.  Meet STEMI criteria and very concerning for acute ischemia ?Narrative Interpretation: Consistent with STEMI with severe ischemia. ? ? ? ?RADIOLOGY ?I personally reviewed the patient's 1 view chest x-ray after endotracheal tube placement.  The ET tube is in appropriate place.  OG tube needs advancement of 10 to 15 cm which was communicated to the nursing staff.  No other evidence of acute abnormality on the chest x-ray. ? ? ? ?PROCEDURES: ? ?Critical Care performed: Yes, see critical care procedure note(s) ? ?.1-3 Lead EKG Interpretation ?Performed by: Hinda Kehr, MD ?Authorized by: Hinda Kehr, MD  ? ?  Interpretation: normal   ?  ECG rate:  80 ?  ECG rate assessment: normal   ?  Rhythm: sinus rhythm   ?  Ectopy: none   ?  Conduction: normal   ?Procedure Name: Intubation ?Date/Time: 04/07/2022 12:00 AM ?Performed by: Hinda Kehr, MD ?Pre-anesthesia Checklist: Patient identified, Emergency  Drugs available, Suction available and Patient being monitored ?Preoxygenation: Pre-oxygenation with 100% oxygen Edison Pace airway per EMS) ?Induction Type: IV induction and Rapid sequence ?Laryngoscope Size: Glidescope and 3 ?Tube size: 7.5 mm ?Number of attempts: 1 ?Placement Confirmation: ETT inserted through vocal cords under direct vision, CO2 detector and Breath sounds checked- equal and bilateral ?Secured at: 25 cm ?Tube secured  with: ETT holder ?Dental Injury: Teeth and Oropharynx as per pre-operative assessment  ? ? ? ?.Critical Care ?Performed by: Hinda Kehr, MD ?Authorized by: Hinda Kehr, MD  ? ?Critical care provider statement:  ?  Critical care time (minutes):  40 ?  Critical care time was exclusive of:  Separately billable procedures and treating other patients ?  Critical care was necessary to treat or prevent imminent or life-threatening deterioration of the following conditions:  Cardiac failure and respiratory failure ?  Critical care was time spent personally by me on the following activities:  Development of treatment plan with patient or surrogate, evaluation of patient's response to treatment, examination of patient, obtaining history from patient or surrogate, ordering and performing treatments and interventions, ordering and review of laboratory studies, ordering and review of radiographic studies, pulse oximetry, re-evaluation of patient's condition and review of old charts ? ? ?MEDICATIONS ORDERED IN ED: ?Medications  ?0.9 %  sodium chloride infusion ( Intravenous New Bag/Given 04/07/22 0012)  ?aspirin suppository 300 mg ( Rectal MAR Unhold 04/07/22 0258)  ?albuterol (PROVENTIL) (2.5 MG/3ML) 0.083% nebulizer solution 2.5 mg (has no administration in time range)  ?atorvastatin (LIPITOR) tablet 80 mg (has no administration in time range)  ?B-complex with vitamin C tablet 1 tablet (has no administration in time range)  ?carvedilol (COREG) tablet 6.25 mg (has no administration in time range)   ?fluticasone (FLONASE) 50 MCG/ACT nasal spray 1 spray (has no administration in time range)  ?hydrALAZINE (APRESOLINE) tablet 50 mg (has no administration in time range)  ?ipratropium (ATROVENT) 0.06 % nasal

## 2022-04-07 NOTE — Progress Notes (Signed)
04/07/22 @ 6am. Pt cleared by wife for MRI. No implants, pt on a vent, Nurse wants MRI at 8am but outpatients are scheduled at that time. MRI will call after shift change to set up a appointment time. ?

## 2022-04-07 NOTE — ED Notes (Signed)
PT LABS SENT AT THIS TIME ?

## 2022-04-07 NOTE — Progress Notes (Signed)
eLink Physician-Brief Progress Note ?Patient Name: Jason Neal ?DOB: [birth year] ?MRN: [id number] ? ? ?Date of Service ? 04/07/2022  ?HPI/Events of Note ? Patient with out of hospital cardiac arrest secondary to STEMI, he had a total of 9 minutes of CPR involving 3 round of Epinephrine + defibrillation, he was intubated in the ED and taken to the cath lab for PCI and DES to the left circumflex artery, he is on the ventilator.  ?eICU Interventions ? New Patient Evaluation.  ? ? ? ?  ? ?Kerry Kass Zykera Abella ?04/07/2022, 3:12 AM ?

## 2022-04-07 NOTE — Progress Notes (Signed)
Initial Nutrition Assessment ? ?DOCUMENTATION CODES:  ? ?Obesity unspecified ? ?INTERVENTION:  ? ?-If unable to extubate and pt/ family desire aggressive care, recommend: ? ?Initiate Vital High Protein @ 40 ml/hr via OGT ? ?90 ml Prosource TF 5 times daily.   ? ?30 ml free water flush every 4 hours to maintain tube patency ? ?Tube feeding regimen provides 1360 kcal, 194 grams of protein, and 803 ml of H2O.  Total free water: 983 ml daily ? ?NUTRITION DIAGNOSIS:  ? ?Inadequate oral intake related to inability to eat as evidenced by NPO status. ? ?GOAL:  ? ?Provide needs based on ASPEN/SCCM guidelines ? ?MONITOR:  ? ?Vent status, Labs, Weight trends, Skin, I & O's ? ?REASON FOR ASSESSMENT:  ? ?Ventilator ?  ? ?ASSESSMENT:  ? ?71 y.o with significant PMH of OSA on CPAP, tSAH,T2DM, HTN, HLD, CKD stage III, chronic atrial fibrillation on Xarelto, aortic stenosis, CAD with history of PCI to the LCx and RCA with complicated PCI requiring transfer to [facility] for balloon pump who presented to the ED postcardiac arrest ? ?Pt admitted with STEMI, PEA cardiac arrest, and cardiogenic shock ? ?Patient is currently intubated on ventilator support. OGT in place and confirmed by x-ray.  ?MV: 8.5 L/min ?Temp (24hrs), Avg:97.1 ?F (36.2 ?C), Min:94.8 ?F (34.9 ?C), Max:99.3 ?F (37.4 ?C) ? ?Propofol: 21 ml/hr (provides 554 kcals daily) ? ?Reviewed I/O's: -130 ml x 24 hours ? ?UOP: 130 ml x 24 hours ? ?MAP: 71 ? ?Case discussed with RN, MD, and during ICU rounds. Plan to decrease sedation, neuro consult, EEG, and MRI. Pt with possible prognosis due to suspected prolonged downtime. Per MD, no plan for TF today, but will re-assess in 24 hours.  ? ?Reviewed wt hx; no wt loss noted over the past 9 months.  ? ?Medications reviewed and include b-complex with vitamin C, colace, miralax, levophed, and propofol.  ? ?Lab Results  ?Component Value Date  ? HGBA1C 7.6 (H) 04/06/2022  ? PTA DM medications are 2.5-500 mg glipizide metformin BID.  ? ?Labs  reviewed: CBGS: 993-716 (inpatient orders for glycemic control are 0-9 units insulin aspart every 4 hours).   ? ?NUTRITION - FOCUSED PHYSICAL EXAM: ? ?Flowsheet Row Most Recent Value  ?Orbital Region No depletion  ?Upper Arm Region No depletion  ?Thoracic and Lumbar Region No depletion  ?Buccal Region No depletion  ?Temple Region No depletion  ?Clavicle Bone Region No depletion  ?Clavicle and Acromion Bone Region No depletion  ?Scapular Bone Region No depletion  ?Dorsal Hand No depletion  ?Patellar Region No depletion  ?Anterior Thigh Region No depletion  ?Posterior Calf Region No depletion  ?Edema (RD Assessment) None  ?Hair Reviewed  ?Eyes Reviewed  ?Mouth Reviewed  ?Skin Reviewed  ?Nails Reviewed  ? ?  ? ? ?Diet Order:   ?Diet Order   ? ?       ?  Diet Heart Room service appropriate? Yes; Fluid consistency: Thin  Diet effective now       ?  ? ?  ?  ? ?  ? ? ?EDUCATION NEEDS:  ? ?No education needs have been identified at this time ? ?Skin:  Skin Assessment: Reviewed RN Assessment ? ?Last BM:  04/07/22 ? ?Height:  ? ?Ht Readings from Last 1 Encounters:  ?04/06/22 '6\' 3"'$  (1.905 m)  ? ? ?Weight:  ? ?Wt Readings from Last 1 Encounters:  ?04/06/22 116.5 kg  ? ? ?Ideal Body Weight:  89.1 kg ? ?BMI:  Body mass  index is 32.1 kg/m?. ? ?Estimated Nutritional Needs:  ? ?Kcal:  5486-2824 ? ?Protein:  >178 grams ? ?Fluid:  > 1.3 L ? ? ? ?Loistine Chance, RD, LDN, CDCES ?Registered Dietitian II ?Certified Diabetes Care and Education Specialist ?Please refer to [facility] for RD and/or RD on-call/weekend/after hours pager  ?

## 2022-04-07 NOTE — Progress Notes (Signed)
Pharmacy Antibiotic Note ? ?Jason Neal is a 71 y.o. male admitted on 04/06/2022 with pneumonia.  Pharmacy has been consulted for Unasyn dosing. ? ?Plan: ?Unasyn 3 gm IV Q6H ordered to start on 4/28 @ 0600.  ? ?Height: '6\' 3"'$  (190.5 cm) ?Weight: 116.5 kg (256 lb 13.4 oz) ?IBW/kg (Calculated) : 84.5 ? ?No data recorded. ? ?Recent Labs  ?Lab 04/06/22 ?2359 04/07/22 ?0423  ?WBC 12.7* 11.0*  ?CREATININE 1.74* 1.81*  ?  ?Estimated Creatinine Clearance: 50.8 mL/min (A) (by C-G formula based on SCr of 1.81 mg/dL (H)).   ? ?Allergies  ?Allergen Reactions  ? Angiotensin Receptor Blockers Cough  ?  (06/18/19 pt denies)  ? Demeclocycline Other (See Comments)  ?  AS ACHILD  ? ? ?Antimicrobials this admission: >>  ?  >>  ? ?Dose adjustments this admission: ? ? ?Microbiology results: ? BCx:  ? UCx:   ? Sputum:   ? MRSA PCR:  ? ?Thank you for allowing pharmacy to be a part of this patient?s care. ? ?Jason Neal D ?04/07/2022 5:46 AM ? ?

## 2022-04-07 NOTE — H&P (Signed)
? ? ?NAME:  Jason Neal, MRN:  [id number], DOB:  1951-05-21, LOS: 0 ?ADMISSION DATE:  04/06/2022, CONSULTATION DATE:  04/07/22 ?REFERRING MD: Hinda Kehr, MD CHIEF COMPLAINT: Cardiac arrest ? ? ?HPI  ?71 y.o with significant PMH of OSA on CPAP, tSAH,T2DM, HTN, HLD, CKD stage III, chronic atrial fibrillation on Xarelto, aortic stenosis, CAD with history of PCI to the LCx and RCA with complicated PCI requiring transfer to [facility] for balloon pump who presented to the ED postcardiac arrest. ? ?Per patient's wife who is currently at the bedside and witnessed the event, patient was sitting in all recliner when he suddenly gasped 3 times for air prior to becoming unresponsive. He was also sweating diffusely so EMS was called who arrived within 5 minutes.  On EMS arrival, patient was found pulseless therefore CPR was initiated and an airway established.  Initial rhythm on the monitor showed PEA therefore patient received Epi x 3 per ACLS protocol for a total of 6 minutes. He apparently required defibrillation x1 after initially being in PEA prior to ROSC being achieved.  A twelve-lead EKG was obtained which showed ST depression in the anterior leads and inferior leads.  Code STEMI was activated on route to the ED. ? ?ED Course: In the emergency department, patient was completely and responsive but breathing on his own with assistance BVM respiration.  The temperature was ?C, the heart rate 86 beats/minute, the blood pressure 132/76 mm Hg, the respiratory rate 20 breaths/minute, and the oxygen saturation 100% on vent.  Rapid sequence intubation was performed in the ED.  Patient remained hemodynamically stable while pending STEMI MD evaluation at the bedside.  Labs obtained per STEMI protocol.  [name] ?Pertinent Labs in Red/Diagnostics Findings: ?Na+/ K+: 139/3.5 ?Glucose: 273 ?BUN/Cr.:  31/1.74 ?CO2: 17 ?Anion gap: 16 ?Calcium: 8.1 ?AST/ALT: 218/184 ?  ?WBC/ TMAX:12.7 / afebrile ?Lactic acid:  ?COVID PCR: Negative ?  ?Troponin:  110 ?Arterial Blood Gas result:  pO2 142; pCO2 35; pH 7.35;  HCO3 19.3, %O2 Sat 99.8.  ? ?Upon evaluation by STEMI on-call MD, patient was taken to the Cath Lab emergently for cardiac catheterization and possible PCI.  Patient was transferred to the ICU post cath and PCCM consulted for further management. ? ?Past Medical History  ?OSA on CPAP, tSAH,T2DM, HTN, HLD, CKD stage III, chronic atrial fibrillation on Xarelto, aortic stenosis, CAD with history of PCI to the LCx and RCA with complicated PCI requiring transfer to [facility] for balloon pump ? ?Significant Hospital Events   ?4/28: Admitted to the ICU with acute ST elevation MI of the lateral wall s/p cath ? ?Consults:  ?Cardiology ? ?Procedures:  ?4/28:Intubation ?4/28: Cardiac catheterization ?4/28: Right IJ central line ?4/28: Left radial arterial line ? ?Significant Diagnostic Tests:  ?4/28: Chest Xray> ?4/28: MRI Brain> ? ?Micro Data:  ?4/28: SARS-CoV-2 PCR> negative ?4/28: MRSA PCR>>  ? ?Antimicrobials:  ?None ? ?OBJECTIVE  ?Blood pressure (!) 146/106, pulse (!) 56, resp. rate (!) 22, height '6\' 3"'$  (1.905 m), weight 116.5 kg, SpO2 100 %. ?   ?Vent Mode: PRVC ?FiO2 (%):  [100 %] 100 % ?Set Rate:  [20 bmp] 20 bmp ?Vt Set:  [520 mL] 520 mL ?PEEP:  [5 cmH20-10 cmH20] 5 cmH20 ?Plateau Pressure:  [14 cmH20] 14 cmH20  ?No intake or output data in the 24 hours ending 04/07/22 0434 ?Filed Weights  ? 04/06/22 2357  ?Weight: 116.5 kg  ? ?Physical Examination  ?GENERAL:71 year-old critically ill patient lying in the bed intubated, mechanically  ventilated and sedated ?EYES: Pupils equal, round, reactive to light and accommodation. No scleral icterus. Extraocular muscles intact.  ?HEENT: Head atraumatic, normocephalic. Oropharynx and nasopharynx clear.  ?NECK:  Supple, no jugular venous distention. No thyroid enlargement, no tenderness.  ?LUNGS: Normal breath sounds bilaterally, no wheezing, rales,rhonchi or crepitation. No use of accessory muscles of respiration.   ?CARDIOVASCULAR: S1, S2 normal. No murmurs, rubs, or gallops.  ?ABDOMEN: Soft, nontender, nondistended. Bowel sounds present. No organomegaly or mass.  ?EXTREMITIES: No pedal edema, cyanosis, or clubbing.  ?NEUROLOGIC: Patient heavily sedated unable to accurately assess cranial nerves II through XII,  Muscle strength in all extremities, sensation intact and Gait  ?PSYCHIATRIC: The patient is intubated and sedated.  ?SKIN: No obvious rash, lesion, or ulcer.  ? ?Labs/imaging that I havepersonally reviewed  ?(right click and "Reselect all SmartList Selections" daily)  ? ?  ?Labs   ?CBC: ?Recent Labs  ?Lab 04/06/22 ?2359  ?WBC 12.7*  ?NEUTROABS 6.5  ?HGB 12.1*  ?HCT 38.8*  ?MCV 87.4  ?PLT 197  ? ? ?Basic Metabolic Panel: ?Recent Labs  ?Lab 04/06/22 ?2359  ?NA 139  ?K 3.5  ?CL 106  ?CO2 17*  ?GLUCOSE 273*  ?BUN 31*  ?CREATININE 1.74*  ?CALCIUM 8.1*  ? ?GFR: ?Estimated Creatinine Clearance: 52.8 mL/min (A) (by C-G formula based on SCr of 1.74 mg/dL (H)). ?Recent Labs  ?Lab 04/06/22 ?2359  ?WBC 12.7*  ? ? ?Liver Function Tests: ?Recent Labs  ?Lab 04/06/22 ?2359  ?AST 218*  ?ALT 184*  ?ALKPHOS 50  ?BILITOT 0.4  ?PROT 6.8  ?ALBUMIN 3.5  ? ?No results for input(s): LIPASE, AMYLASE in the last 168 hours. ?No results for input(s): AMMONIA in the last 168 hours. ? ?ABG ?   ?Component Value Date/Time  ? PHART 7.31 (L) 04/07/2022 0423  ? PCO2ART 40 04/07/2022 0423  ? PO2ART 161 (H) 04/07/2022 0423  ? HCO3 20.1 04/07/2022 0423  ? TCO2 23 08/22/2018 1239  ? ACIDBASEDEF 5.8 (H) 04/07/2022 0423  ? O2SAT 99.9 04/07/2022 0423  ?  ? ?Coagulation Profile: ?Recent Labs  ?Lab 04/06/22 ?2359  ?INR 1.7*  ? ? ?Cardiac Enzymes: ?No results for input(s): CKTOTAL, CKMB, CKMBINDEX, TROPONINI in the last 168 hours. ? ?HbA1C: ?Hgb A1c MFr Bld  ?Date/Time Value Ref Range Status  ?04/06/2022 11:59 PM 7.6 (H) 4.8 - 5.6 % Final  ?  Comment:  ?  (NOTE) ?Pre diabetes:          5.7%-6.4% ? ?Diabetes:              >6.4% ? ?Glycemic control for    <7.0% ?adults with diabetes ?  ?03/04/2012 01:07 PM 6.6 (H) <5.7 % Final  ?  Comment:  ?  (NOTE) ?                                                                      ?According to the ADA Clinical Practice Recommendations for 2011, when ?HbA1c is used as a screening test: ? >=6.5%   Diagnostic of Diabetes Mellitus ?          (if abnormal result is confirmed) ?5.7-6.4%   Increased risk of developing Diabetes Mellitus ?References:Diagnosis and Classification of Diabetes Mellitus,Diabetes ?MHDQ,2229,79(GXQJJ 1):S62-S69 and Standards of Medical Care  in         ?Diabetes - 2011,Diabetes Care,2011,34 (Suppl 1):S11-S61.  ? ? ?CBG: ?Recent Labs  ?Lab 04/07/22 ?9449  ?GLUCAP 214*  ? ? ?Review of Systems:   ?UNABLE TO OBTAIN PATIENT IS INTUBATED AND SEDATED ? ?Past Medical History  ?He,  has a past medical history of Allergies, Aortic valve stenosis, Brain bleed ([place]) (08/2018), CAD (coronary artery disease), Cancer ([place]) (2017), Carotid bruit (03/14/11), Chronic renal insufficiency, stage III (moderate) ([place]), DJD (degenerative joint disease), Dyslipidemia, Dysrhythmia, GERD (gastroesophageal reflux disease), Heart murmur, History of kidney stones, Hypercholesteremia, Hypertension, PAF (paroxysmal atrial fibrillation) [facility]) (March 2013), Sinus headache, Sleep apnea, and Type 2 diabetes mellitus with chronic kidney disease ([place]).  ? ?Surgical History   ? ?Past Surgical History:  ?Procedure Laterality Date  ? C-spine surgery  2001  ? cardiac catherization  03/04/12  ? Dr Gwenlyn Found  ? CARDIOVERSION  03/07/2012  ? Procedure: CARDIOVERSION;  Surgeon: Sanda Klein, MD;  Location: [facility] ENDOSCOPY;  Service: Cardiovascular;  Laterality: N/A;  ? CATARACT EXTRACTION W/PHACO Right 06/25/2019  ? Procedure: CATARACT EXTRACTION PHACO AND INTRAOCULAR LENS PLACEMENT ([place]) RIGHT;  Surgeon: Leandrew Koyanagi, MD;  Location: [facility];  Service: Ophthalmology;  Laterality: Right;  Diabetic - oral meds ?sleep apnea  ? CATARACT EXTRACTION  W/PHACO Left 07/23/2019  ? Procedure: CATARACT EXTRACTION PHACO AND INTRAOCULAR LENS PLACEMENT ([place]) LEFT DIABETIC;  Surgeon: Leandrew Koyanagi, MD;  Location: [facility];  Service: Ophthalmology;  Laterality:

## 2022-04-08 ENCOUNTER — Inpatient Hospital Stay (HOSPITAL_COMMUNITY)
Admit: 2022-04-08 | Discharge: 2022-04-08 | Disposition: A | Discharge status: Home / Self Care | Payer: Medicare Other | Attending: Cardiovascular Disease | Admitting: Cardiovascular Disease

## 2022-04-08 ENCOUNTER — Inpatient Hospital Stay: Payer: Medicare Other

## 2022-04-08 DIAGNOSIS — I480 Paroxysmal atrial fibrillation: Secondary | ICD-10-CM | POA: Diagnosis not present

## 2022-04-08 DIAGNOSIS — I2510 Atherosclerotic heart disease of native coronary artery without angina pectoris: Secondary | ICD-10-CM

## 2022-04-08 DIAGNOSIS — I469 Cardiac arrest, cause unspecified: Secondary | ICD-10-CM | POA: Diagnosis not present

## 2022-04-08 DIAGNOSIS — I2129 ST elevation (STEMI) myocardial infarction involving other sites: Secondary | ICD-10-CM | POA: Diagnosis not present

## 2022-04-08 LAB — GLUCOSE, CAPILLARY
Glucose-Capillary: 198 mg/dL — ABNORMAL HIGH (ref 70–99)
Glucose-Capillary: 171 mg/dL — ABNORMAL HIGH (ref 70–99)
Glucose-Capillary: 167 mg/dL — ABNORMAL HIGH (ref 70–99)
Glucose-Capillary: 179 mg/dL — ABNORMAL HIGH (ref 70–99)
Glucose-Capillary: 171 mg/dL — ABNORMAL HIGH (ref 70–99)
Glucose-Capillary: 191 mg/dL — ABNORMAL HIGH (ref 70–99)

## 2022-04-08 LAB — ECHOCARDIOGRAM COMPLETE
AR max vel: 2 cm2
AV Area VTI: 2.65 cm2
AV Area mean vel: 2.02 cm2
AV Mean grad: 14 mmHg
AV Peak grad: 25.9 mmHg
Ao pk vel: 2.55 m/s
Area-P 1/2: 2.86 cm2
Calc EF: 69.6 %
Height: 75 in
Single Plane A2C EF: 69.1 %
Single Plane A4C EF: 68.4 %
Weight: 4109.37 oz

## 2022-04-08 LAB — COMPREHENSIVE METABOLIC PANEL
ALT: 137 U/L — ABNORMAL HIGH (ref 0–44)
AST: 225 U/L — ABNORMAL HIGH (ref 15–41)
Albumin: 3.2 g/dL — ABNORMAL LOW (ref 3.5–5.0)
Alkaline Phosphatase: 35 U/L — ABNORMAL LOW (ref 38–126)
Anion gap: 9 (ref 5–15)
BUN: 38 mg/dL — ABNORMAL HIGH (ref 8–23)
CO2: 21 mmol/L — ABNORMAL LOW (ref 22–32)
Calcium: 7.9 mg/dL — ABNORMAL LOW (ref 8.9–10.3)
Chloride: 108 mmol/L (ref 98–111)
Creatinine, Ser: 2.32 mg/dL — ABNORMAL HIGH (ref 0.61–1.24)
GFR, Estimated: 29 mL/min — ABNORMAL LOW (ref >60)
Glucose, Bld: 193 mg/dL — ABNORMAL HIGH (ref 70–99)
Potassium: 3.8 mmol/L (ref 3.5–5.1)
Sodium: 138 mmol/L (ref 135–145)
Total Bilirubin: 0.5 mg/dL (ref 0.3–1.2)
Total Protein: 6.3 g/dL — ABNORMAL LOW (ref 6.5–8.1)

## 2022-04-08 LAB — CBC
HCT: 28.9 % — ABNORMAL LOW (ref 39.0–52.0)
Hemoglobin: 9.2 g/dL — ABNORMAL LOW (ref 13.0–17.0)
MCH: 27.2 pg (ref 26.0–34.0)
MCHC: 31.8 g/dL (ref 30.0–36.0)
MCV: 85.5 fL (ref 80.0–100.0)
Platelets: 209 10*3/uL (ref 150–400)
RBC: 3.38 MIL/uL — ABNORMAL LOW (ref 4.22–5.81)
RDW: 16 % — ABNORMAL HIGH (ref 11.5–15.5)
WBC: 11 10*3/uL — ABNORMAL HIGH (ref 4.0–10.5)
nRBC: 0 % (ref 0.0–0.2)

## 2022-04-08 LAB — PHOSPHORUS: Phosphorus: 3.7 mg/dL (ref 2.5–4.6)

## 2022-04-08 LAB — MAGNESIUM: Magnesium: 2.2 mg/dL (ref 1.7–2.4)

## 2022-04-08 LAB — MRSA NEXT GEN BY PCR, NASAL: MRSA by PCR Next Gen: NOT DETECTED

## 2022-04-08 LAB — TRIGLYCERIDES: Triglycerides: 560 mg/dL — ABNORMAL HIGH (ref <150)

## 2022-04-08 IMAGING — DX DG CHEST 1V PORT
1 series · 1 of 1 positions shown · non-contrast
Comparison: Portable chest [DATE] and earlier.

CLINICAL DATA: 70-year-old male with respiratory failure status
post cardiopulmonary arrest, STEMI.

EXAM:
PORTABLE CHEST 1 VIEW

[chest ap]
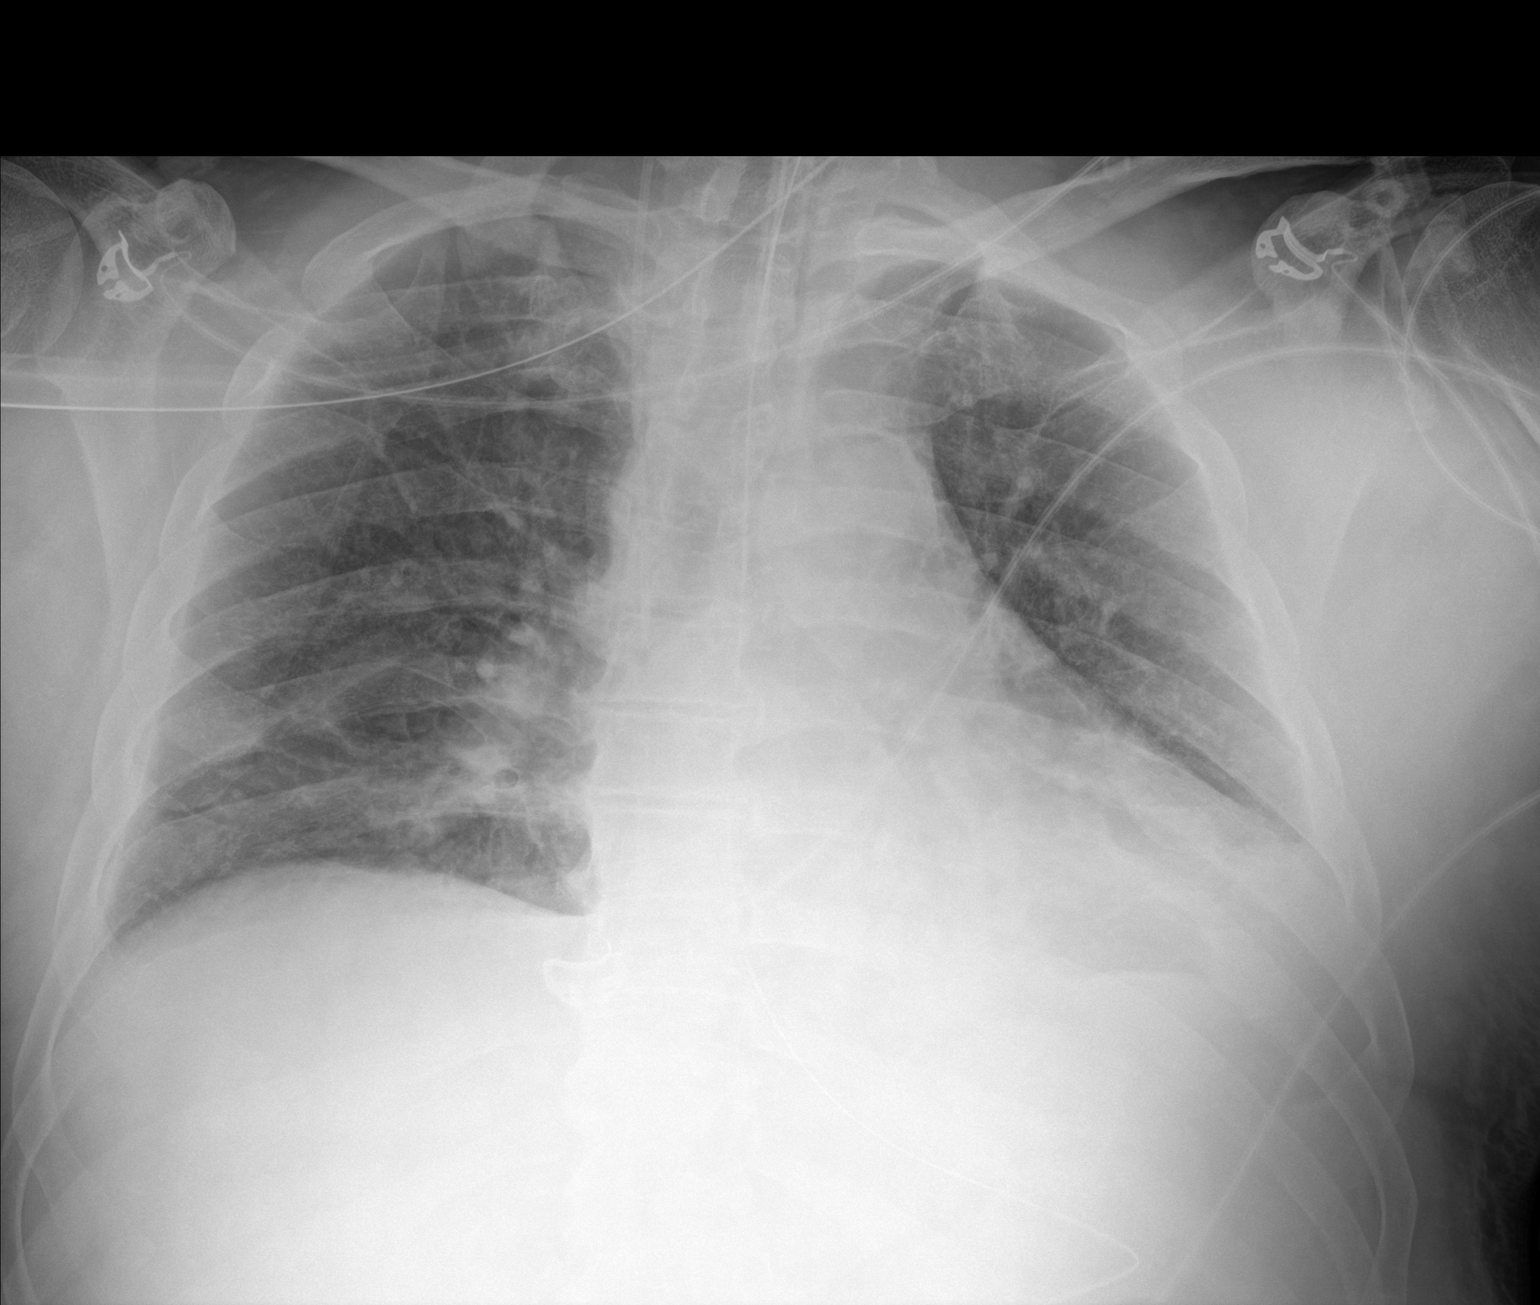

[1 of 1 positions shown; findings below may reference images not displayed]

FINDINGS: Portable AP upright view at [RJ] hours. Stable lines and tubes.
Mildly improved lung volumes and regressed bilateral perihilar
opacity. However, there is new confluent peribronchial opacity at
the left lung base partially obscuring the left hemidiaphragm.
Stable cardiac size and mediastinal contours. No pneumothorax. No
definite effusion. Paucity of bowel gas in the upper abdomen. No
acute osseous abnormality identified.
IMPRESSION: 1. Stable lines and tubes.
2. Evidence of regressed pulmonary edema since yesterday. New
confluent left lung base opacity, consider atelectasis or pneumonia.

## 2022-04-08 MED ORDER — PANTOPRAZOLE SODIUM 40 MG IV SOLR
40.0000 mg | INTRAVENOUS | Status: DC
Start: 2022-04-08 — End: 2022-04-08

## 2022-04-08 MED ORDER — PERFLUTREN LIPID MICROSPHERE
1.0000 mL | INTRAVENOUS | Status: AC | PRN
  Administered 2022-04-08: 4 mL via INTRAVENOUS
  Filled 2022-04-08: qty 10

## 2022-04-08 MED ORDER — RIVAROXABAN 20 MG PO TABS: 20.0000 mg | ORAL_TABLET | Freq: Every day | ORAL | Status: DC

## 2022-04-08 MED ORDER — MIDAZOLAM HCL 2 MG/2ML IJ SOLN
4.0000 mg | INTRAMUSCULAR | Status: AC
  Administered 2022-04-08: 4 mg via INTRAVENOUS

## 2022-04-08 MED ORDER — MIDAZOLAM HCL 2 MG/2ML IJ SOLN
INTRAMUSCULAR | Status: AC
  Filled 2022-04-08: qty 4

## 2022-04-08 MED ORDER — MIDAZOLAM HCL 2 MG/2ML IJ SOLN: 1.0000 mg | INTRAMUSCULAR | Status: DC | PRN

## 2022-04-08 MED ORDER — PANTOPRAZOLE 2 MG/ML SUSPENSION
40.0000 mg | Freq: Every day | ORAL | Status: DC
  Administered 2022-04-08 – 2022-04-11 (×4): 40 mg
  Filled 2022-04-08 (×4): qty 20

## 2022-04-08 MED ORDER — ASPIRIN 81 MG PO CHEW
81.0000 mg | CHEWABLE_TABLET | Freq: Every day | ORAL | Status: AC
  Administered 2022-04-09: 81 mg
  Filled 2022-04-08: qty 1

## 2022-04-08 MED ORDER — B COMPLEX-C PO TABS
1.0000 | ORAL_TABLET | ORAL | Status: DC
  Administered 2022-04-09 – 2022-04-11 (×2): 1
  Filled 2022-04-08 (×2): qty 1

## 2022-04-08 MED ORDER — LEVETIRACETAM IN NACL 1000 MG/100ML IV SOLN
1000.0000 mg | Freq: Two times a day (BID) | INTRAVENOUS | Status: DC
  Administered 2022-04-08 – 2022-04-11 (×6): 1000 mg via INTRAVENOUS
  Filled 2022-04-08 (×7): qty 100

## 2022-04-08 MED ORDER — ACETAMINOPHEN 325 MG PO TABS: 650.0000 mg | ORAL_TABLET | ORAL | Status: DC | PRN

## 2022-04-08 MED ORDER — RIVAROXABAN 15 MG PO TABS
15.0000 mg | ORAL_TABLET | Freq: Every day | ORAL | Status: DC
  Administered 2022-04-08: 15 mg
  Filled 2022-04-08: qty 1

## 2022-04-08 MED ORDER — ATORVASTATIN CALCIUM 20 MG PO TABS
80.0000 mg | ORAL_TABLET | Freq: Every day | ORAL | Status: DC
  Administered 2022-04-09 – 2022-04-11 (×3): 80 mg
  Filled 2022-04-08 (×3): qty 4

## 2022-04-08 MED ORDER — CLOPIDOGREL BISULFATE 75 MG PO TABS
75.0000 mg | ORAL_TABLET | Freq: Every day | ORAL | Status: DC
  Administered 2022-04-09 – 2022-04-11 (×3): 75 mg
  Filled 2022-04-08 (×3): qty 1

## 2022-04-08 MED ORDER — MIDAZOLAM HCL 2 MG/2ML IJ SOLN
2.0000 mg | INTRAMUSCULAR | Status: DC | PRN
  Administered 2022-04-08: 4 mg via INTRAVENOUS
  Administered 2022-04-08: 2 mg via INTRAVENOUS
  Administered 2022-04-09 – 2022-04-10 (×7): 4 mg via INTRAVENOUS
  Filled 2022-04-08 (×9): qty 4

## 2022-04-08 NOTE — Progress Notes (Signed)
? ?Progress Note ? ?Patient Name: Jason Neal ?Date of Encounter: 04/08/2022 ? ?[facility] Cardiologist: Quay Burow, MD  ? ?Subjective  ? ?Intubated and sedated, family at bedside. Overall neurological status seems to be improving. Still on pressors. HR trop elevated to >24,000.  ? ?Inpatient Medications  ?  ?Scheduled Meds: ? aspirin  81 mg Oral Daily  ? aspirin  300 mg Rectal Once  ? atorvastatin  80 mg Oral Daily  ? B-complex with vitamin C  1 tablet Oral QODAY  ? chlorhexidine gluconate (MEDLINE KIT)  15 mL Mouth Rinse BID  ? Chlorhexidine Gluconate Cloth  6 each Topical Daily  ? clopidogrel  75 mg Oral Q breakfast  ? docusate  100 mg Per Tube BID  ? insulin aspart  0-9 Units Subcutaneous Q4H  ? ipratropium  2 spray Each Nare TID  ? mouth rinse  15 mL Mouth Rinse 10 times per day  ? pantoprazole  40 mg Oral QPM  ? polyethylene glycol  17 g Per Tube Daily  ? rivaroxaban  20 mg Oral Daily  ? sodium chloride  2 spray Each Nare BID  ? sodium chloride flush  3 mL Intravenous Q12H  ? ?Continuous Infusions: ? sodium chloride 10 mL/hr at 04/07/22 2000  ? sodium chloride    ? ampicillin-sulbactam (UNASYN) IV 3 g (04/08/22 0220)  ? fentaNYL infusion INTRAVENOUS 80 mcg/hr (04/08/22 0700)  ? levETIRAcetam 500 mg (04/07/22 2318)  ? norepinephrine (LEVOPHED) Adult infusion 8 mcg/min (04/08/22 0700)  ? propofol (DIPRIVAN) infusion 20 mcg/kg/min (04/08/22 0700)  ? vasopressin Stopped (04/07/22 2305)  ? ?PRN Meds: ?sodium chloride, acetaminophen, albuterol, fentaNYL, fluticasone, nitroGLYCERIN, ondansetron (ZOFRAN) IV, polyvinyl alcohol, sodium chloride flush  ? ?Vital Signs  ?  ?Vitals:  ? 04/08/22 0500 04/08/22 0600 04/08/22 0700 04/08/22 0811  ?BP: (!) 117/55 (!) 117/55 (!) 118/56   ?Pulse: 65 62 61   ?Resp: _0 ?Temp: 100 ?F (37.8 ?C)     ?TempSrc: Oral     ?SpO2: 100% 100% 100% 100%  ?Weight:      ?Height:      ? ? ?Intake/Output Summary (Last 24 hours) at 04/08/2022 0825 ?Last data filed at 04/08/2022  0700 ?Gross per 24 hour  ?Intake 1961.8 ml  ?Output 650 ml  ?Net 1311.8 ml  ? ? ?  04/06/2022  ? 11:57 PM 08/02/2021  ?  7:51 AM 06/06/2021  ?  8:46 AM  ?Last 3 Weights  ?Weight (lbs) 256 lb 13.4 oz 238 lb 234 lb 9.6 oz  ?Weight (kg) 116.5 kg 107.956 kg 106.414 kg  ?   ? ?Telemetry  ?  ?NSR HR 60s - Personally Reviewed ? ?ECG  ?  ? No new- Personally Reviewed ? ?Physical Exam  ? ?GEN: intubated and sedated ?Neck: No JVD ?Cardiac: RRR, no murmurs, rubs, or gallops.  ?Respiratory: Clear to auscultation bilaterally. ?GI: Soft, nontender, non-distended  ?MS: No edema; No deformity. ?Neuro:  Nonfocal  ?Psych: Normal affect  ? ?Labs  ?  ?High Sensitivity Troponin:   ?Recent Labs  ?Lab 04/06/22 ?2359 04/07/22 ?0423 04/07/22 ?0370  ?TROPONINIHS 110* 21,580* >24,000*  ?   ?Chemistry ?Recent Labs  ?Lab 04/06/22 ?2359 04/07/22 ?0423 04/07/22 ?1102 04/08/22 ?0434  ?NA 139 136 141 138  ?K 3.5 4.6 4.4 3.8  ?CL 106 110 108 108  ?CO2 17* 21* 23 21*  ?GLUCOSE 273* 233* 209* 193*  ?BUN 31* 34* 37* 38*  ?CREATININE 1.74* 1.81* 2.25* 2.32*  ?  CALCIUM 8.1* 7.8* 8.1* 7.9*  ?MG  --   --  1.4* 2.2  ?PROT 6.8  --  6.2* 6.3*  ?ALBUMIN 3.5  --  3.3* 3.2*  ?AST 218*  --  362* 225*  ?ALT 184*  --  189* 137*  ?ALKPHOS 50  --  41 35*  ?BILITOT 0.4  --  0.5 0.5  ?GFRNONAA 41* 39* 30* 29*  ?ANIONGAP 16* _0 ?  ?Lipids  ?Recent Labs  ?Lab 04/06/22 ?2359 04/08/22 ?0434  ?CHOL 140  --   ?TRIG 221* 560*  ?HDL 34*  --   ?LDLCALC 62  --   ?CHOLHDL 4.1  --   ?  ?Hematology ?Recent Labs  ?Lab 04/06/22 ?2359 04/07/22 ?0423 04/07/22 ?1604 04/08/22 ?0434  ?WBC 12.7* 11.0*  --  11.0*  ?RBC 4.44 4.04*  --  3.38*  ?HGB 12.1* 10.8* 10.0* 9.2*  ?HCT 38.8* 34.0* 31.6* 28.9*  ?MCV 87.4 84.2  --  85.5  ?MCH 27.3 26.7  --  27.2  ?MCHC 31.2 31.8  --  31.8  ?RDW 15.5 15.4  --  16.0*  ?PLT 197 218  --  209  ? ?Thyroid No results for input(s): TSH, FREET4 in the last 168 hours.  ?BNPNo results for input(s): BNP, PROBNP in the last 168 hours.  ?DDimer No results for  input(s): DDIMER in the last 168 hours.  ? ?Radiology  ?  ?DG Abd 1 View ? ?Result Date: 04/07/2022 ?CLINICAL DATA:  NG tube placement. EXAM: ABDOMEN - 1 VIEW COMPARISON:  Earlier same day FINDINGS: 0522 hours. NG tube tip is in the distal stomach. Nonspecific bowel gas pattern. IMPRESSION: NG tube tip is in the distal stomach. Electronically Signed   By: Misty Stanley M.D.   On: 04/07/2022 05:49  ? ?DG Abd 1 View ? ?Result Date: 04/07/2022 ?CLINICAL DATA:  71 year old male central line and enteric tube placement. EXAM: ABDOMEN - 1 VIEW COMPARISON:  Portable chest at the same time. Prior abdominal film 05/24/2006. FINDINGS: Portable AP semi upright view at 0418 hours. Enteric tube tip courses to the GEJ, but the tube appears to be proximal to the stomach which is moderately distended with gas. Gas-filled but nondilated small bowel throughout the visible lower abdomen. No acute osseous abnormality identified. IMPRESSION: 1. Enteric tube tip at the GEJ. Advance 8 cm to ensure side hole placement within the stomach. 2. Moderately distended stomach with gas. Gas-filled small bowel throughout the abdomen. Electronically Signed   By: Genevie Ann M.D.   On: 04/07/2022 04:49  ? ?MR BRAIN WO CONTRAST ? ?Result Date: 04/07/2022 ?CLINICAL DATA:  Anoxic brain damage. EXAM: MRI HEAD WITHOUT CONTRAST TECHNIQUE: Multiplanar, multiecho pulse sequences of the brain and surrounding structures were obtained without intravenous contrast. COMPARISON:  None. FINDINGS: Brain: Punctate focus of restricted diffusion in the right cerebellar hemisphere (series 5, image 70). Susceptibility artifact in the occipital horn of the right lateral ventricle suggesting minimal intraventricular hemorrhage. Linear susceptibility artifact is also seen in the posterior temporal lobe, may represent trace subarachnoid hemorrhage versus vessel thrombosis. No evidence of diffuse anoxic brain injury. Scattered foci of T2 hyperintensity are seen within the white  matter of the cerebral hemispheres, nonspecific, most likely related to chronic small vessel ischemia. 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 Vascular: Normal flow voids. Skull and upper cervical spine: Normal marrow signal. Sinuses/Orbits: Trace mucosal thickening throughout the paranasal sinuses. Bilateral lens surgery. Bilateral mastoid effusion. Other: None. IMPRESSION: 1. Punctate acute/subacute infarct  in the right cerebellar hemisphere. 2. Trace intraventricular hemorrhage in the occipital horn of the right lateral ventricle. 3. No evidence of global anoxic injury. Repeat study within 2-4 days may be obtained to exclude delayed injury. Electronically Signed   By: Pedro Earls M.D.   On: 04/07/2022 15:18  ? ?CARDIAC CATHETERIZATION ? ?Result Date: 04/07/2022 ?  CULPRIT LESION: Prox Cx to Mid Cx lesion is 90% stenosed.  Proximal to previously placed stent   A drug-eluting stent was successfully placed (overlapping previously placed stent) using a STENT ONYX FRONTIER 2.25X26.  Postdilated to 2.6 mm   Post intervention, there is a 0% residual stenosis.   Previously placed Mid Cx to Dist Cx stent (drug-eluting stent) is  widely patent.   ----------------------------------------------------   Mid LAD lesion is 35% stenosed.   1st Diag lesion is 55% stenosed.  2nd Diag lesion is 70% stenosed.   Previously placed Prox RCA stent (drug-eluting stent) is  widely patent.   Mid RCA to Dist RCA lesion is 35% stenosed. SUMMARY 3-4-vessel CAD with Culprit Lesion Being 78 to 90% mid LCx stenosis just prior to previously placed stent; widely patent RCA stent with mild diffuse disease distally, diffuse 50 to 65% stenosis in large 1st Diag and focal 70% lesion in small 2nd Diag (not PCI target) Successful DES PCI of mid LCx lesion reducing from 85-90% to 0% with a Onyx Frontier DES 2.25 mm x 22 mm postdilated to 2.6 mm (overlaps previous stent); TIMI-3 flow pre and post Unable to cross aortic valve  with multiple different catheters.  He has known mild aortic stenosis but also tortuous aortic root.  Very difficult to engage. => We will need echocardiogram RECOMMENDATIONS PCCM to admit to ICU for vent management.

## 2022-04-08 NOTE — Progress Notes (Signed)
? ? ?NAME:  Jason Neal, MRN:  [id number], DOB:  [birth year], LOS: 1 ?ADMISSION DATE:  04/06/2022, CONSULTATION DATE:  04/07/22 ?REFERRING MD: Hinda Kehr, MD CHIEF COMPLAINT: Cardiac arrest ? ? ?HPI  ?71 y.o with significant PMH of OSA on CPAP, tSAH,T2DM, HTN, HLD, CKD stage III, chronic atrial fibrillation on Xarelto, aortic stenosis, CAD with history of PCI to the LCx and RCA with complicated PCI requiring transfer to [facility] for balloon pump who presented to the ED postcardiac arrest. ? ?Per patient's wife who is currently at the bedside and witnessed the event, patient was sitting in all recliner when he suddenly gasped 3 times for air prior to becoming unresponsive. He was also sweating diffusely so EMS was called who arrived within 5 minutes.  On EMS arrival, patient was found pulseless therefore CPR was initiated and an airway established.  Initial rhythm on the monitor showed PEA therefore patient received Epi x 3 per ACLS protocol for a total of 6 minutes. He apparently required defibrillation x1 after initially being in PEA prior to ROSC being achieved.  A twelve-lead EKG was obtained which showed ST depression in the anterior leads and inferior leads.  Code STEMI was activated on route to the ED. ? ?ED Course: In the emergency department, patient was completely and responsive but breathing on his own with assistance BVM respiration.  The temperature was ?C, the heart rate 86 beats/minute, the blood pressure 132/76 mm Hg, the respiratory rate 20 breaths/minute, and the oxygen saturation 100% on vent.  Rapid sequence intubation was performed in the ED.  Patient remained hemodynamically stable while pending STEMI MD evaluation at the bedside.  Labs obtained per STEMI protocol.  [name] ?Pertinent Labs in Red/Diagnostics Findings: ?Na+/ K+: 139/3.5 ?Glucose: 273 ?BUN/Cr.:  31/1.74 ?CO2: 17 ?Anion gap: 16 ?Calcium: 8.1 ?AST/ALT: 218/184 ?  ?WBC/ TMAX:12.7 / afebrile ?Lactic acid:  ?COVID PCR: Negative ?  ?Troponin:  110 ?Arterial Blood Gas result:  pO2 142; pCO2 35; pH 7.35;  HCO3 19.3, %O2 Sat 99.8.  ? ?Upon evaluation by STEMI on-call MD, patient was taken to the Cath Lab emergently for cardiac catheterization and possible PCI.  Patient was transferred to the ICU post cath and PCCM consulted for further management. ? ?Past Medical History  ?OSA on CPAP, tSAH,T2DM, HTN, HLD, CKD stage III, chronic atrial fibrillation on Xarelto, aortic stenosis, CAD with history of PCI to the LCx and RCA with complicated PCI requiring transfer to [facility] for balloon pump ? ?Significant Hospital Events   ?4/28: Admitted to the ICU with acute ST elevation MI of the lateral wall s/p cath ? ?Consults:  ?Cardiology ? ?Procedures:  ?4/28:Intubation ?4/28: Cardiac catheterization ?4/28: Right IJ central line ?4/28: Left radial arterial line ? ?Significant Diagnostic Tests:  ?4/28: Cardiac Cath>>Prox Cx to Mid Cx lesion is 90% stenosed ?Proximal to previously placed stent A drug-eluting stent was ?successfully placed (overlapping previously placed stent) using a STENT ONYX FRONTIER 2.25X26.  Postdilated to 2.6 mm Post intervention, there is a 0% residual stenosis. Previously placed Mid Cx to Dist Cx stent (drug-eluting stent) is  widely patent. Mid LAD lesion is 35% stenosed. 1st Diag lesion is 55% stenosed.  2nd Diag lesion is 70% stenosed. Previously placed Prox RCA stent (drug- ?eluting stent) is  widely patent. Mid RCA to Dist RCA lesion is 35% stenosed. ?4/28: MRI Brain>>Punctate acute/subacute infarct in the right cerebellar hemisphere. Trace intraventricular hemorrhage in the occipital horn of the right lateral ventricle. No evidence of global anoxic injury.  Repeat study within 2-4 days may be obtained to exclude delayed injury. ?4/29: Echo  ? ?Micro Data:  ?4/28: SARS-CoV-2 PCR> negative ?4/29: MRSA PCR>>  ? ?Antimicrobials:  ?Unasyn 04/29>> ? ?OBJECTIVE  ?Blood pressure (!) 105/52, pulse 64, temperature 99.1 ?F (37.3 ?C), temperature source  Esophageal, resp. rate 20, height '6\' 3"'$  (1.905 m), weight 116.5 kg, SpO2 100 %. ?   ?Vent Mode: PRVC ?FiO2 (%):  [30 %-50 %] 30 % ?Set Rate:  [20 bmp] 20 bmp ?Vt Set:  [520 mL] 520 mL ?PEEP:  [5 cmH20] 5 cmH20 ?Plateau Pressure:  [17 cmH20-18 cmH20] 18 cmH20  ? ?Intake/Output Summary (Last 24 hours) at 04/08/2022 1004 ?Last data filed at 04/08/2022 0700 ?Gross per 24 hour  ?Intake 1961.8 ml  ?Output 650 ml  ?Net 1311.8 ml  ? ?Filed Weights  ? 04/06/22 2357  ?Weight: 116.5 kg  ? ?Physical Examination  ?GENERAL:71 year-old critically ill appearing male with myoclonus, NAD mechanically intubated  ?HEENT: Supple, no JVD  ?LUNGS: Clear throughout, even, non labored, orally intubated  ?CARDIOVASCULAR: NSR, s1s2, rrr, no r/g, 2+ radial/1+ distal pulses, no edema  ?ABDOMEN: Soft, non distended, +BS x4. ?EXTREMITIES: Normal bulk and tone ?NEUROLOGIC: Myoclonus present, not following commands, +gag reflex, raises arms during suctioning, PERRL ?SKIN: No obvious rash, lesion, or ulcer.  ? ?Labs/imaging that I havepersonally reviewed  ?(right click and "Reselect all SmartList Selections" daily)  ? ?  ?Labs   ?CBC: ?Recent Labs  ?Lab 04/06/22 ?2359 04/07/22 ?0423 04/07/22 ?1604 04/08/22 ?0434  ?WBC 12.7* 11.0*  --  11.0*  ?NEUTROABS 6.5  --   --   --   ?HGB 12.1* 10.8* 10.0* 9.2*  ?HCT 38.8* 34.0* 31.6* 28.9*  ?MCV 87.4 84.2  --  85.5  ?PLT 197 218  --  209  ? ? ?Basic Metabolic Panel: ?Recent Labs  ?Lab 04/06/22 ?2359 04/07/22 ?0423 04/07/22 ?1102 04/08/22 ?0434  ?NA 139 136 141 138  ?K 3.5 4.6 4.4 3.8  ?CL 106 110 108 108  ?CO2 17* 21* 23 21*  ?GLUCOSE 273* 233* 209* 193*  ?BUN 31* 34* 37* 38*  ?CREATININE 1.74* 1.81* 2.25* 2.32*  ?CALCIUM 8.1* 7.8* 8.1* 7.9*  ?MG  --   --  1.4* 2.2  ?PHOS  --   --  3.9 3.7  ? ?GFR: ?Estimated Creatinine Clearance: 39.6 mL/min (A) (by C-G formula based on SCr of 2.32 mg/dL (H)). ?Recent Labs  ?Lab 04/06/22 ?2359 04/07/22 ?0423 04/07/22 ?1100 04/08/22 ?0434  ?WBC 12.7* 11.0*  --  11.0*   ?LATICACIDVEN  --   --  2.2*  --   ? ? ?Liver Function Tests: ?Recent Labs  ?Lab 04/06/22 ?2359 04/07/22 ?1102 04/08/22 ?0434  ?AST 218* 362* 225*  ?ALT 184* 189* 137*  ?ALKPHOS 50 41 35*  ?BILITOT 0.4 0.5 0.5  ?PROT 6.8 6.2* 6.3*  ?ALBUMIN 3.5 3.3* 3.2*  ? ?No results for input(s): LIPASE, AMYLASE in the last 168 hours. ?No results for input(s): AMMONIA in the last 168 hours. ? ?ABG ?   ?Component Value Date/Time  ? PHART 7.31 (L) 04/07/2022 0423  ? PCO2ART 40 04/07/2022 0423  ? PO2ART 161 (H) 04/07/2022 0423  ? HCO3 20.1 04/07/2022 0423  ? TCO2 23 08/22/2018 1239  ? ACIDBASEDEF 5.8 (H) 04/07/2022 0423  ? O2SAT 99.9 04/07/2022 0423  ?  ? ?Coagulation Profile: ?Recent Labs  ?Lab 04/06/22 ?2359  ?INR 1.7*  ? ? ?Cardiac Enzymes: ?No results for input(s): CKTOTAL, CKMB, CKMBINDEX, TROPONINI in the last  168 hours. ? ?HbA1C: ?Hgb A1c MFr Bld  ?Date/Time Value Ref Range Status  ?04/06/2022 11:59 PM 7.6 (H) 4.8 - 5.6 % Final  ?  Comment:  ?  (NOTE) ?Pre diabetes:          5.7%-6.4% ? ?Diabetes:              >6.4% ? ?Glycemic control for   <7.0% ?adults with diabetes ?  ?03/04/2012 01:07 PM 6.6 (H) <5.7 % Final  ?  Comment:  ?  (NOTE) ?                                                                      ?According to the ADA Clinical Practice Recommendations for 2011, when ?HbA1c is used as a screening test: ? >=6.5%   Diagnostic of Diabetes Mellitus ?          (if abnormal result is confirmed) ?5.7-6.4%   Increased risk of developing Diabetes Mellitus ?References:Diagnosis and Classification of Diabetes Mellitus,Diabetes ?YWVP,7106,26(RSWNI 1):S62-S69 and Standards of Medical Care in         ?Diabetes - 2011,Diabetes Care,2011,34 (Suppl 1):S11-S61.  ? ? ?CBG: ?Recent Labs  ?Lab 04/07/22 ?1628 04/07/22 ?1940 04/07/22 ?2338 04/08/22 ?6270 04/08/22 ?0757  ?GLUCAP 198* 195* 193* 198* 171*  ? ? ?Review of Systems:   ?UNABLE TO OBTAIN PATIENT IS INTUBATED AND SEDATED ? ?Past Medical History  ?He,  has a past medical history of  Allergies, Aortic valve stenosis, Brain bleed ([place]) (08/2018), CAD (coronary artery disease), Cancer ([place]) (2017), Carotid bruit (03/14/11), Chronic renal insufficiency, stage III (moderate) ([place]), DJD (degenerativ

## 2022-04-08 NOTE — Procedures (Addendum)
Routine EEG Report ? ?GAD Neal is a 71 y.o. male with a history of cardiac arrest who is undergoing an EEG to evaluate for seizures. ? ?Report: This EEG was acquired with electrodes placed according to the International 10-20 electrode system (including Fp1, Fp2, F3, F4, C3, C4, P3, P4, O1, O2, T3, T4, T5, T6, A1, A2, Fz, Cz, Pz). The following electrodes were missing or displaced: none. ? ?The initial background was burst suppression. After propofol was turned off, background evolved to generalized periodic discharges at 1 Hz over a background of diffuse suppression timelocked to full body myoclonus. There were no other electrographic seizure types identified. ? ?Impression and clinical correlation: This EEG was obtained while comatose on fentanyl, and initially on propofol, and is abnormal due to: ?- Initial burst suppression pattern consistent with global cerebral dysfunction, medication effect, or both. ?- After propofol was turned off, patient developed generalized periodic discharges at 1 Hz over a background of diffuse suppression timelocked to full body myoclonus, consistent with myoclonic status epilepticus. ? ?Su Monks, MD ?[facility] ?[phone] ? ?If 7pm- 7am, please page neurology on call as listed in [place]. ? ?

## 2022-04-08 NOTE — Plan of Care (Signed)
Continuing with plan of care. 

## 2022-04-08 NOTE — Progress Notes (Signed)
*  PRELIMINARY RESULTS* ?Echocardiogram ?2D Echocardiogram has been performed. Definity IV enhancing agent used on this study. ? ?Jason Neal ?04/08/2022, 11:47 AM ?

## 2022-04-08 NOTE — Progress Notes (Signed)
PHARMACY CONSULT NOTE ? ?Pharmacy Consult for Electrolyte Monitoring and Replacement  ? ?Recent Labs: ?Potassium (mmol/L)  ?Date Value  ?04/08/2022 3.8  ? ?Magnesium (mg/dL)  ?Date Value  ?04/08/2022 2.2  ? ?Calcium (mg/dL)  ?Date Value  ?04/08/2022 7.9 (L)  ? ?Albumin (g/dL)  ?Date Value  ?04/08/2022 3.2 (L)  ? ?Phosphorus (mg/dL)  ?Date Value  ?04/08/2022 3.7  ? ?Sodium (mmol/L)  ?Date Value  ?04/08/2022 138  ? ?Corrected Ca: 8.6 mg/dL ? ?Assessment: 71 y.o with significant PMH of OSA on CPAP, tSAH,T2DM, HTN, HLD, CKD stage III, chronic atrial fibrillation on Xarelto, aortic stenosis, CAD with history of PCI to the LCx and RCA with complicated PCI requiring transfer to [facility] for balloon pump who presented to the ED postcardiac arrest. Pharmacy is asked to follow and replace electrolytes while patient is in CCU ? ?Goal of Therapy:  ?Potassium 4.0 - 5.1 mmol/L ?Magnesium 2.0 - 2.4 mg/dL ?All Other Electrolytes WNL ? ?Plan:  ?No electrolyte replacement at this time ?Recheck electrolytes in am ? ?Noralee Space ,PharmD ?Clinical Pharmacist ?04/08/2022 1:56 PM ? ?

## 2022-04-09 ENCOUNTER — Inpatient Hospital Stay: Payer: Medicare Other

## 2022-04-09 DIAGNOSIS — I480 Paroxysmal atrial fibrillation: Secondary | ICD-10-CM | POA: Diagnosis not present

## 2022-04-09 DIAGNOSIS — I2129 ST elevation (STEMI) myocardial infarction involving other sites: Secondary | ICD-10-CM | POA: Diagnosis not present

## 2022-04-09 DIAGNOSIS — I469 Cardiac arrest, cause unspecified: Secondary | ICD-10-CM | POA: Diagnosis not present

## 2022-04-09 LAB — CBC WITH DIFFERENTIAL/PLATELET
Abs Immature Granulocytes: 0.02 10*3/uL (ref 0.00–0.07)
Basophils Absolute: 0 10*3/uL (ref 0.0–0.1)
Basophils Relative: 0 %
Eosinophils Absolute: 0.1 10*3/uL (ref 0.0–0.5)
Eosinophils Relative: 1 %
HCT: 24.2 % — ABNORMAL LOW (ref 39.0–52.0)
Hemoglobin: 7.7 g/dL — ABNORMAL LOW (ref 13.0–17.0)
Immature Granulocytes: 0 %
Lymphocytes Relative: 14 %
Lymphs Abs: 1.2 10*3/uL (ref 0.7–4.0)
MCH: 27.3 pg (ref 26.0–34.0)
MCHC: 31.8 g/dL (ref 30.0–36.0)
MCV: 85.8 fL (ref 80.0–100.0)
Monocytes Absolute: 0.9 10*3/uL (ref 0.1–1.0)
Monocytes Relative: 10 %
Neutro Abs: 6.9 10*3/uL (ref 1.7–7.7)
Neutrophils Relative %: 75 %
Platelets: 150 10*3/uL (ref 150–400)
RBC: 2.82 MIL/uL — ABNORMAL LOW (ref 4.22–5.81)
RDW: 15.9 % — ABNORMAL HIGH (ref 11.5–15.5)
WBC: 9.1 10*3/uL (ref 4.0–10.5)
nRBC: 0 % (ref 0.0–0.2)

## 2022-04-09 LAB — BASIC METABOLIC PANEL
Anion gap: 5 (ref 5–15)
BUN: 32 mg/dL — ABNORMAL HIGH (ref 8–23)
CO2: 23 mmol/L (ref 22–32)
Calcium: 7.7 mg/dL — ABNORMAL LOW (ref 8.9–10.3)
Chloride: 112 mmol/L — ABNORMAL HIGH (ref 98–111)
Creatinine, Ser: 1.83 mg/dL — ABNORMAL HIGH (ref 0.61–1.24)
GFR, Estimated: 39 mL/min — ABNORMAL LOW (ref >60)
Glucose, Bld: 184 mg/dL — ABNORMAL HIGH (ref 70–99)
Potassium: 3.7 mmol/L (ref 3.5–5.1)
Sodium: 140 mmol/L (ref 135–145)

## 2022-04-09 LAB — GLUCOSE, CAPILLARY
Glucose-Capillary: 177 mg/dL — ABNORMAL HIGH (ref 70–99)
Glucose-Capillary: 160 mg/dL — ABNORMAL HIGH (ref 70–99)
Glucose-Capillary: 161 mg/dL — ABNORMAL HIGH (ref 70–99)
Glucose-Capillary: 167 mg/dL — ABNORMAL HIGH (ref 70–99)
Glucose-Capillary: 172 mg/dL — ABNORMAL HIGH (ref 70–99)

## 2022-04-09 LAB — HEMOGLOBIN AND HEMATOCRIT, BLOOD
HCT: 27.2 % — ABNORMAL LOW (ref 39.0–52.0)
Hemoglobin: 8.7 g/dL — ABNORMAL LOW (ref 13.0–17.0)

## 2022-04-09 LAB — HEPATIC FUNCTION PANEL
ALT: 85 U/L — ABNORMAL HIGH (ref 0–44)
AST: 115 U/L — ABNORMAL HIGH (ref 15–41)
Albumin: 2.9 g/dL — ABNORMAL LOW (ref 3.5–5.0)
Alkaline Phosphatase: 30 U/L — ABNORMAL LOW (ref 38–126)
Bilirubin, Direct: 0.1 mg/dL (ref 0.0–0.2)
Indirect Bilirubin: 0.6 mg/dL (ref 0.3–0.9)
Total Bilirubin: 0.7 mg/dL (ref 0.3–1.2)
Total Protein: 6.1 g/dL — ABNORMAL LOW (ref 6.5–8.1)

## 2022-04-09 LAB — ABO/RH: ABO/RH(D): A NEG

## 2022-04-09 LAB — MAGNESIUM
Magnesium: 2.1 mg/dL (ref 1.7–2.4)
Magnesium: 2 mg/dL (ref 1.7–2.4)

## 2022-04-09 LAB — PHOSPHORUS
Phosphorus: 2.4 mg/dL — ABNORMAL LOW (ref 2.5–4.6)
Phosphorus: 2.9 mg/dL (ref 2.5–4.6)

## 2022-04-09 LAB — PREPARE RBC (CROSSMATCH)

## 2022-04-09 IMAGING — DX DG CHEST 1V PORT
1 series · 1 of 1 positions shown · non-contrast
Comparison: [DATE]

CLINICAL DATA: Acute respiratory failure

EXAM:
PORTABLE CHEST 1 VIEW

[chest ap]
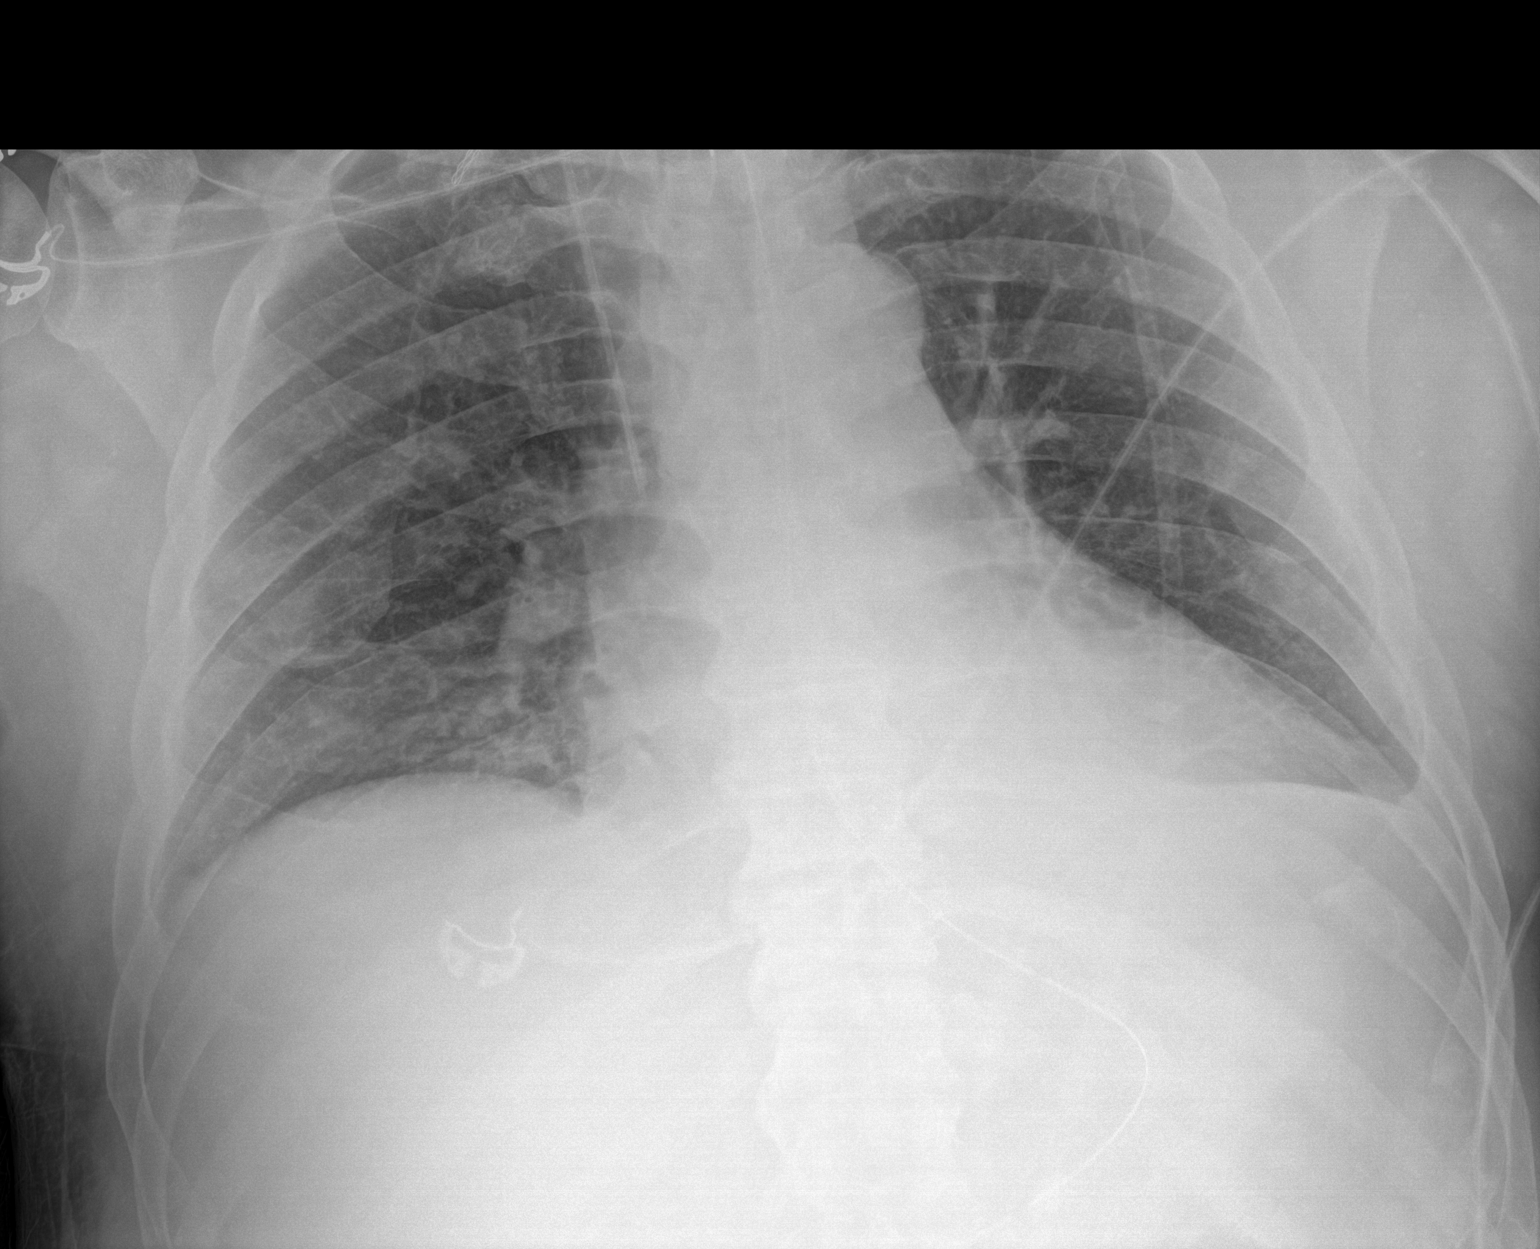

[1 of 1 positions shown; findings below may reference images not displayed]

FINDINGS: Endotracheal tube, enteric tube, and right IJ central line again
identified. Decreased small left pleural effusion with improved
aeration at the left lung base. Stable cardiomediastinal contours.
IMPRESSION: Stable lines and tubes. Decreased small left pleural effusion and
improved aeration at the left lung base.

## 2022-04-09 MED ORDER — DEXMEDETOMIDINE HCL IN NACL 400 MCG/100ML IV SOLN
0.4000 ug/kg/h | INTRAVENOUS | Status: DC
  Administered 2022-04-09: 1.2 ug/kg/h via INTRAVENOUS
  Filled 2022-04-09: qty 100

## 2022-04-09 MED ORDER — HYDRALAZINE HCL 20 MG/ML IJ SOLN: 10.0000 mg | Freq: Once | INTRAMUSCULAR | Status: DC

## 2022-04-09 MED ORDER — OXYCODONE HCL 5 MG PO TABS
5.0000 mg | ORAL_TABLET | Freq: Four times a day (QID) | ORAL | Status: DC
  Administered 2022-04-09 – 2022-04-11 (×9): 5 mg
  Filled 2022-04-09 (×9): qty 1

## 2022-04-09 MED ORDER — NOREPINEPHRINE 4 MG/250ML-% IV SOLN
0.0000 ug/min | INTRAVENOUS | Status: DC
  Administered 2022-04-09: 5 ug/min via INTRAVENOUS
  Filled 2022-04-09 (×2): qty 250

## 2022-04-09 MED ORDER — POTASSIUM & SODIUM PHOSPHATES 280-160-250 MG PO PACK
2.0000 | PACK | ORAL | Status: AC
  Administered 2022-04-09 (×2): 2
  Filled 2022-04-09 (×2): qty 2

## 2022-04-09 MED ORDER — VITAL HIGH PROTEIN PO LIQD: 1000.0000 mL | ORAL | Status: DC

## 2022-04-09 MED ORDER — HYDRALAZINE HCL 20 MG/ML IJ SOLN
10.0000 mg | Freq: Once | INTRAMUSCULAR | Status: AC
  Administered 2022-04-09: 10 mg via INTRAVENOUS
  Filled 2022-04-09: qty 1

## 2022-04-09 MED ORDER — FENTANYL 2500MCG IN NS 250ML (10MCG/ML) PREMIX INFUSION
0.0000 ug/h | INTRAVENOUS | Status: DC
  Administered 2022-04-09 – 2022-04-10 (×3): 125 ug/h via INTRAVENOUS
  Administered 2022-04-11: 150 ug/h via INTRAVENOUS
  Filled 2022-04-09 (×5): qty 250

## 2022-04-09 MED ORDER — HYDRALAZINE HCL 20 MG/ML IJ SOLN
INTRAMUSCULAR | Status: AC
  Administered 2022-04-09: 20 mg
  Filled 2022-04-09: qty 1

## 2022-04-09 MED ORDER — PROPOFOL 1000 MG/100ML IV EMUL
5.0000 ug/kg/min | INTRAVENOUS | Status: DC
  Administered 2022-04-09: 20 ug/kg/min via INTRAVENOUS
  Administered 2022-04-10: 30 ug/kg/min via INTRAVENOUS
  Administered 2022-04-10: 20 ug/kg/min via INTRAVENOUS
  Administered 2022-04-10: 30 ug/kg/min via INTRAVENOUS
  Administered 2022-04-10: 20 ug/kg/min via INTRAVENOUS
  Administered 2022-04-11: 40 ug/kg/min via INTRAVENOUS
  Administered 2022-04-11: 30 ug/kg/min via INTRAVENOUS
  Administered 2022-04-11: 40 ug/kg/min via INTRAVENOUS
  Administered 2022-04-11 (×2): 30 ug/kg/min via INTRAVENOUS
  Filled 2022-04-09 (×11): qty 100

## 2022-04-09 MED ORDER — PROSOURCE TF PO LIQD
90.0000 mL | Freq: Every day | ORAL | Status: DC
  Administered 2022-04-09 – 2022-04-10 (×3): 90 mL
  Filled 2022-04-09 (×5): qty 90

## 2022-04-09 MED ORDER — SODIUM CHLORIDE 0.9% IV SOLUTION: Freq: Once | INTRAVENOUS | Status: AC

## 2022-04-09 MED ORDER — FENTANYL CITRATE PF 50 MCG/ML IJ SOSY
50.0000 ug | PREFILLED_SYRINGE | INTRAMUSCULAR | Status: DC | PRN
  Administered 2022-04-09 – 2022-04-10 (×2): 100 ug via INTRAVENOUS
  Filled 2022-04-09 (×2): qty 2

## 2022-04-09 NOTE — Progress Notes (Signed)
PHARMACY CONSULT NOTE ? ?Pharmacy Consult for Electrolyte Monitoring and Replacement  ? ?Recent Labs: ?Potassium (mmol/L)  ?Date Value  ?04/09/2022 3.7  ? ?Magnesium (mg/dL)  ?Date Value  ?04/09/2022 2.1  ? ?Calcium (mg/dL)  ?Date Value  ?04/09/2022 7.7 (L)  ? ?Albumin (g/dL)  ?Date Value  ?04/08/2022 3.2 (L)  ? ?Phosphorus (mg/dL)  ?Date Value  ?04/09/2022 2.4 (L)  ? ?Sodium (mmol/L)  ?Date Value  ?04/09/2022 140  ? ?Corrected Ca: 8.6 mg/dL ? ?Assessment: 71 y.o with significant PMH of OSA on CPAP, tSAH,T2DM, HTN, HLD, CKD stage III, chronic atrial fibrillation on Xarelto, aortic stenosis, CAD with history of PCI to the LCx and RCA with complicated PCI requiring transfer to [facility] for balloon pump who presented to the ED postcardiac arrest. Pharmacy is asked to follow and replace electrolytes while patient is in CCU ? ?Goal of Therapy:  ?Potassium 4.0 - 5.1 mmol/L ?Magnesium 2.0 - 2.4 mg/dL ?All Other Electrolytes WNL ? ?Plan:  ?Phos 2.4 ?Will order Phos-NaK 2 packets q4h x 2 doses per tube ?Recheck electrolytes in am  ? ?Noralee Space ,PharmD ?Clinical Pharmacist ?04/09/2022 8:44 AM ? ?

## 2022-04-09 NOTE — Progress Notes (Signed)
? ?Progress Note ? ?Patient Name: Jason Neal ?Date of Encounter: 04/09/2022 ? ?[facility] Cardiologist: Quay Burow, MD  ? ?Subjective  ? ?Intubated, pressors taken off, maintaining adequate BP.  No acute events overnight, hemoglobin 7.7 this morning.  Xarelto held.  Maintaining sinus rhythm.  Wife and daughter at bedside. ? ?Inpatient Medications  ?  ?Scheduled Meds: ? aspirin  300 mg Rectal Once  ? atorvastatin  80 mg Per Tube Daily  ? B-complex with vitamin C  1 tablet Per Tube QODAY  ? chlorhexidine gluconate (MEDLINE KIT)  15 mL Mouth Rinse BID  ? Chlorhexidine Gluconate Cloth  6 each Topical Daily  ? clopidogrel  75 mg Per Tube Q breakfast  ? docusate  100 mg Per Tube BID  ? insulin aspart  0-9 Units Subcutaneous Q4H  ? ipratropium  2 spray Each Nare TID  ? mouth rinse  15 mL Mouth Rinse 10 times per day  ? pantoprazole sodium  40 mg Per Tube Daily  ? polyethylene glycol  17 g Per Tube Daily  ? potassium & sodium phosphates  2 packet Per Tube Q4H  ? sodium chloride  2 spray Each Nare BID  ? sodium chloride flush  3 mL Intravenous Q12H  ? ?Continuous Infusions: ? sodium chloride 10 mL/hr at 04/07/22 2000  ? sodium chloride    ? ampicillin-sulbactam (UNASYN) IV 200 mL/hr at 04/09/22 1000  ? fentaNYL infusion INTRAVENOUS 125 mcg/hr (04/09/22 1000)  ? levETIRAcetam Stopped (04/08/22 2342)  ? norepinephrine (LEVOPHED) Adult infusion Stopped (04/09/22 0435)  ? vasopressin Stopped (04/07/22 2305)  ? ?PRN Meds: ?sodium chloride, acetaminophen, albuterol, fentaNYL, fluticasone, midazolam, nitroGLYCERIN, ondansetron (ZOFRAN) IV, polyvinyl alcohol, sodium chloride flush  ? ?Vital Signs  ?  ?Vitals:  ? 04/09/22 0930 04/09/22 0945 04/09/22 1000 04/09/22 1015  ?BP: (!) 144/66  137/62   ?Pulse: 73 73 69 68  ?Resp: 19 (!) $Remo'21 14 14  'Qmlca$ ?Temp: 98.6 ?F (37 ?C) 98.2 ?F (36.8 ?C) 98.4 ?F (36.9 ?C) 98.6 ?F (37 ?C)  ?TempSrc: Esophageal Esophageal Esophageal Esophageal  ?SpO2: 95% 95% 95% 96%  ?Weight:      ?Height:       ? ? ?Intake/Output Summary (Last 24 hours) at 04/09/2022 1118 ?Last data filed at 04/09/2022 1000 ?Gross per 24 hour  ?Intake 6235.03 ml  ?Output 1450 ml  ?Net 4785.03 ml  ? ? ?  04/06/2022  ? 11:57 PM 08/02/2021  ?  7:51 AM 06/06/2021  ?  8:46 AM  ?Last 3 Weights  ?Weight (lbs) 256 lb 13.4 oz 238 lb 234 lb 9.6 oz  ?Weight (kg) 116.5 kg 107.956 kg 106.414 kg  ?   ? ?Telemetry  ?  ?Sinus rhythm, heart rate 72- Personally Reviewed ? ?ECG  ?  ? - Personally Reviewed ? ?Physical Exam  ? ?GEN: Intubated, on fentanyl ?Neck: No JVD ?Cardiac: RRR, no murmurs, rubs, or gallops.  ?Respiratory: Vented breath sounds ?GI: Soft, nontender, non-distended  ?MS: No edema; No deformity. ?Neuro: Unable to assess ?Psych: Unable to assess ? ?Labs  ?  ?High Sensitivity Troponin:   ?Recent Labs  ?Lab 04/06/22 ?2359 04/07/22 ?0423 04/07/22 ?8832  ?TROPONINIHS 110* 21,580* >24,000*  ?   ?Chemistry ?Recent Labs  ?Lab 04/07/22 ?1102 04/08/22 ?0434 04/09/22 ?0431  ?NA 141 138 140  ?K 4.4 3.8 3.7  ?CL 108 108 112*  ?CO2 23 21* 23  ?GLUCOSE 209* 193* 184*  ?BUN 37* 38* 32*  ?CREATININE 2.25* 2.32* 1.83*  ?CALCIUM 8.1* 7.9*  7.7*  ?MG 1.4* 2.2 2.1  ?PROT 6.2* 6.3* 6.1*  ?ALBUMIN 3.3* 3.2* 2.9*  ?AST 362* 225* 115*  ?ALT 189* 137* 85*  ?ALKPHOS 41 35* 30*  ?BILITOT 0.5 0.5 0.7  ?GFRNONAA 30* 29* 39*  ?ANIONGAP 10 9 5   ?  ?Lipids  ?Recent Labs  ?Lab 04/06/22 ?2359 04/08/22 ?0434  ?CHOL 140  --   ?TRIG 221* 560*  ?HDL 34*  --   ?LDLCALC 62  --   ?CHOLHDL 4.1  --   ?  ?Hematology ?Recent Labs  ?Lab 04/07/22 ?0423 04/07/22 ?1604 04/08/22 ?0434 04/09/22 ?0431  ?WBC 11.0*  --  11.0* 9.1  ?RBC 4.04*  --  3.38* 2.82*  ?HGB 10.8* 10.0* 9.2* 7.7*  ?HCT 34.0* 31.6* 28.9* 24.2*  ?MCV 84.2  --  85.5 85.8  ?MCH 26.7  --  27.2 27.3  ?MCHC 31.8  --  31.8 31.8  ?RDW 15.4  --  16.0* 15.9*  ?PLT 218  --  209 150  ? ?Thyroid No results for input(s): TSH, FREET4 in the last 168 hours.  ?BNPNo results for input(s): BNP, PROBNP in the last 168 hours.  ?DDimer No results for  input(s): DDIMER in the last 168 hours.  ? ?Radiology  ?  ?MR BRAIN WO CONTRAST ? ?Result Date: 04/07/2022 ?CLINICAL DATA:  Anoxic brain damage. EXAM: MRI HEAD WITHOUT CONTRAST TECHNIQUE: Multiplanar, multiecho pulse sequences of the brain and surrounding structures were obtained without intravenous contrast. COMPARISON:  None. FINDINGS: Brain: Punctate focus of restricted diffusion in the right cerebellar hemisphere (series 5, image 70). Susceptibility artifact in the occipital horn of the right lateral ventricle suggesting minimal intraventricular hemorrhage. Linear susceptibility artifact is also seen in the posterior temporal lobe, may represent trace subarachnoid hemorrhage versus vessel thrombosis. No evidence of diffuse anoxic brain injury. Scattered foci of T2 hyperintensity are seen within the white matter of the cerebral hemispheres, nonspecific, most likely related to chronic small vessel ischemia. 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 Vascular: Normal flow voids. Skull and upper cervical spine: Normal marrow signal. Sinuses/Orbits: Trace mucosal thickening throughout the paranasal sinuses. Bilateral lens surgery. Bilateral mastoid effusion. Other: None. IMPRESSION: 1. Punctate acute/subacute infarct in the right cerebellar hemisphere. 2. Trace intraventricular hemorrhage in the occipital horn of the right lateral ventricle. 3. No evidence of global anoxic injury. Repeat study within 2-4 days may be obtained to exclude delayed injury. Electronically Signed   By: 04/09/2022 M.D.   On: 04/07/2022 15:18  ? ?DG Chest Port 1 View ? ?Result Date: 04/09/2022 ?CLINICAL DATA:  Acute respiratory failure EXAM: PORTABLE CHEST 1 VIEW COMPARISON:  04/08/2022 FINDINGS: Endotracheal tube, enteric tube, and right IJ central line again identified. Decreased small left pleural effusion with improved aeration at the left lung base. Stable cardiomediastinal contours. IMPRESSION: Stable lines and  tubes. Decreased small left pleural effusion and improved aeration at the left lung base. Electronically Signed   By: 04/10/2022 M.D.   On: 04/09/2022 10:32  ? ?DG Chest Port 1 View ? ?Result Date: 04/08/2022 ?CLINICAL DATA:  71 year old male with respiratory failure status post cardiopulmonary arrest, STEMI. EXAM: PORTABLE CHEST 1 VIEW COMPARISON:  Portable chest 04/07/2022 and earlier. FINDINGS: Portable AP upright view at 0821 hours. Stable lines and tubes. Mildly improved lung volumes and regressed bilateral perihilar opacity. However, there is new confluent peribronchial opacity at the left lung base partially obscuring the left hemidiaphragm. Stable cardiac size and mediastinal contours. No pneumothorax. No definite effusion.  Paucity of bowel gas in the upper abdomen. No acute osseous abnormality identified. IMPRESSION: 1. Stable lines and tubes. 2. Evidence of regressed pulmonary edema since yesterday. New confluent left lung base opacity, consider atelectasis or pneumonia. Electronically Signed   By: Genevie Ann M.D.   On: 04/08/2022 08:50  ? ?EEG adult ? ?Result Date: 04/08/2022 ?Derek Jack, MD     04/08/2022  1:54 PM Routine EEG Report MARCELLIUS MONTAGNA is a 71 y.o. male with a history of cardiac arrest who is undergoing an EEG to evaluate for seizures. Report: This EEG was acquired with electrodes placed according to the International 10-20 electrode system (including Fp1, Fp2, F3, F4, C3, C4, P3, P4, O1, O2, T3, T4, T5, T6, A1, A2, Fz, Cz, Pz). The following electrodes were missing or displaced: none. The initial background was burst suppression. After propofol was turned off, background evolved to generalized periodic discharges at 1 Hz over a background of diffuse suppression timelocked to full body myoclonus. There were no other electrographic seizure types identified. Impression and clinical correlation: This EEG was obtained while comatose on fentanyl, and initially on propofol, and is abnormal due  to: - Initial burst suppression pattern consistent with global cerebral dysfunction, medication effect, or both. - After propofol was turned off, patient developed generalized periodic discharges at 1 Hz

## 2022-04-09 NOTE — Plan of Care (Signed)
Continuing with plan of care. 

## 2022-04-09 NOTE — Progress Notes (Signed)
Pharmacy Antibiotic Note ? ?Jason Neal is a 71 y.o. male admitted on 04/06/2022 with ?asp. pneumonia.  Pharmacy has been consulted for Unasyn dosing. ? ?Plan: ?Unasyn 3 gm IV Q6H ordered to start on 4/28 @ 0600.  ? ? ? ?Height: '6\' 3"'$  (190.5 cm) ?Weight: 116.5 kg (256 lb 13.4 oz) ?IBW/kg (Calculated) : 84.5 ? ?Temp (24hrs), Avg:98.9 ?F (37.2 ?C), Min:96.6 ?F (35.9 ?C), Max:99.9 ?F (37.7 ?C) ? ?Recent Labs  ?Lab 04/06/22 ?2359 04/07/22 ?0423 04/07/22 ?1100 04/07/22 ?1102 04/08/22 ?0434 04/09/22 ?0431  ?WBC 12.7* 11.0*  --   --  11.0* 9.1  ?CREATININE 1.74* 1.81*  --  2.25* 2.32* 1.83*  ?LATICACIDVEN  --   --  2.2*  --   --   --   ? ?  ?Estimated Creatinine Clearance: 50.2 mL/min (A) (by C-G formula based on SCr of 1.83 mg/dL (H)).   ? ?Allergies  ?Allergen Reactions  ? Angiotensin Receptor Blockers Cough  ?  (06/18/19 pt denies)  ? Demeclocycline Other (See Comments)  ?  AS ACHILD  ? ? ?Antimicrobials this admission: >>  ?  Unasyn 4/28>>  ? ?Dose adjustments this admission: ? ? ?Microbiology results: ? BCx:  ? UCx:   ? Sputum:   ? MRSA PCR: neg ?4/29: trach aspirate pend ? ?Thank you for allowing pharmacy to be a part of this patient?s care. ? ?Kalianna Verbeke A ?04/09/2022 8:49 AM ? ?

## 2022-04-09 NOTE — Progress Notes (Addendum)
S: Patient requires sedation 2/2 fighting the vent. Myoclonus is improved when propofol is paused compared to yesterday. NAEON. ? ?EEG yesterday c/w myoclonic status when propofol paused ? ?O: ? ?Vitals:  ? 04/09/22 0748 04/09/22 0800  ?BP:  139/61  ?Pulse:  72  ?Resp:  18  ?Temp:  98.4 ?F (36.9 ?C)  ?SpO2: 96% 95%  ? ?Exam with sedation paused, required fentanyl bolus 172mg prior to exam 2/2 vent dyssynchrony ? ?Gen: stirs in response to vigorous noxious stimuli, no response to commands, no purposeful movement ?CV: RRR ?Resp: ventilated ? ?MS: no response to commands, no purposeful movement ?Speech: intubated ?CN: PERRL, (+) oculocephalics, cough. (-) corneals, gag ?Motor & sensory: no response to noxious stimuli ?Reflexes: 1+ symm throughout, toes up on L, mute on R ? ?A/P: 71yo man s/p cardiac arrest 2/2 STEMI. 2 days out from event, unable to get clean exam off sedation 2/2 him fighting the vent. When propofol is paused patient resumes myoclonic status, confirmed yesterday on EEG. MRI brain does not show definitive e/o anoxic brain injury. ?- Increase keppra to '1000mg'$  q 12 hrs ?- Wean sedation as able ?- Plan for exam off sedation tmrw ? ?This patient is critically ill and at significant risk of neurological worsening, death and care requires constant monitoring of vital signs, hemodynamics,respiratory and cardiac monitoring, neurological assessment, discussion with family, other specialists and medical decision making of high complexity. I spent 45 minutes of neurocritical care time  in the care of  this patient. This was time spent independent of any time provided by nurse practitioner or PA. ? ?CSu Monks MD ?[facility] ?[phone]? ?If 7pm- 7am, please page neurology [place] call [place] ? ?

## 2022-04-09 NOTE — Progress Notes (Signed)
Brief Nutrition Note ? ?Consult received for enteral/tube feeding initiation and management. ? ?Adult Enteral Nutrition Protocol initiated. Full assessment to follow. ? ?Admitting Dx: Acute respiratory failure, unspecified whether with hypoxia or hypercapnia ([place]) [J96.00] ?ST elevation myocardial infarction (STEMI), unspecified artery ([place]) [I21.3] ?Cardiopulmonary arrest with successful resuscitation [facility]) [I46.9] ?Cardiac arrest [name]) [I46.9] ? ? ?Labs: ? ?Recent Labs  ?Lab 04/07/22 ?1102 04/08/22 ?0434 04/09/22 ?0431  ?NA 141 138 140  ?K 4.4 3.8 3.7  ?CL 108 108 112*  ?CO2 23 21* 23  ?BUN 37* 38* 32*  ?CREATININE 2.25* 2.32* 1.83*  ?CALCIUM 8.1* 7.9* 7.7*  ?MG 1.4* 2.2 2.1  ?PHOS 3.9 3.7 2.4*  ?GLUCOSE 209* 193* 184*  ? ? ?Lucas Mallow RD, LDN, CNSC ?Please refer to Amion for contact information.                                                       ? ? ?

## 2022-04-09 NOTE — Progress Notes (Signed)
? ? ?NAME:  Jason Neal, MRN:  [id number], DOB:  1951/06/12, LOS: 2 ?ADMISSION DATE:  04/06/2022, CONSULTATION DATE:  04/07/22 ?REFERRING MD: Hinda Kehr, MD CHIEF COMPLAINT: Cardiac arrest ? ? ?HPI  ?71 y.o with significant PMH of OSA on CPAP, tSAH,T2DM, HTN, HLD, CKD stage III, chronic atrial fibrillation on Xarelto, aortic stenosis, CAD with history of PCI to the LCx and RCA with complicated PCI requiring transfer to [facility] for balloon pump who presented to the ED postcardiac arrest. ? ?Per patient's wife who is currently at the bedside and witnessed the event, patient was sitting in all recliner when he suddenly gasped 3 times for air prior to becoming unresponsive. He was also sweating diffusely so EMS was called who arrived within 5 minutes.  On EMS arrival, patient was found pulseless therefore CPR was initiated and an airway established.  Initial rhythm on the monitor showed PEA therefore patient received Epi x 3 per ACLS protocol for a total of 6 minutes. He apparently required defibrillation x1 after initially being in PEA prior to ROSC being achieved.  A twelve-lead EKG was obtained which showed ST depression in the anterior leads and inferior leads.  Code STEMI was activated on route to the ED. ? ?ED Course: In the emergency department, patient was completely and responsive but breathing on his own with assistance BVM respiration.  The temperature was ?C, the heart rate 86 beats/minute, the blood pressure 132/76 mm Hg, the respiratory rate 20 breaths/minute, and the oxygen saturation 100% on vent.  Rapid sequence intubation was performed in the ED.  Patient remained hemodynamically stable while pending STEMI MD evaluation at the bedside.  Labs obtained per STEMI protocol.  [name] ?Pertinent Labs in Red/Diagnostics Findings: ?Na+/ K+: 139/3.5 ?Glucose: 273 ?BUN/Cr.:  31/1.74 ?CO2: 17 ?Anion gap: 16 ?Calcium: 8.1 ?AST/ALT: 218/184 ?  ?WBC/ TMAX:12.7 / afebrile ?Lactic acid:  ?COVID PCR: Negative ?  ?Troponin:  110 ?Arterial Blood Gas result:  pO2 142; pCO2 35; pH 7.35;  HCO3 19.3, %O2 Sat 99.8.  ? ?Upon evaluation by STEMI on-call MD, patient was taken to the Cath Lab emergently for cardiac catheterization and possible PCI.  Patient was transferred to the ICU post cath and PCCM consulted for further management. ? ?Past Medical History  ?OSA on CPAP, tSAH,T2DM, HTN, HLD, CKD stage III, chronic atrial fibrillation on Xarelto, aortic stenosis, CAD with history of PCI to the LCx and RCA with complicated PCI requiring transfer to [facility] for balloon pump ? ?Significant Hospital Events   ?4/28: Admitted to the ICU with acute ST elevation MI of the lateral wall s/p cath ? ?Consults:  ?Cardiology ? ?Procedures:  ?4/28:Intubation ?4/28: Cardiac catheterization ?4/28: Right IJ central line ?4/28: Left radial arterial line ? ?Significant Diagnostic Tests:  ?4/28: Cardiac Cath>>Prox Cx to Mid Cx lesion is 90% stenosed ?Proximal to previously placed stent A drug-eluting stent was ?successfully placed (overlapping previously placed stent) using a STENT ONYX FRONTIER 2.25X26.  Postdilated to 2.6 mm Post intervention, there is a 0% residual stenosis. Previously placed Mid Cx to Dist Cx stent (drug-eluting stent) is  widely patent. Mid LAD lesion is 35% stenosed. 1st Diag lesion is 55% stenosed.  2nd Diag lesion is 70% stenosed. Previously placed Prox RCA stent (drug- ?eluting stent) is  widely patent. Mid RCA to Dist RCA lesion is 35% stenosed. ?4/28: MRI Brain>>Punctate acute/subacute infarct in the right cerebellar hemisphere. Trace intraventricular hemorrhage in the occipital horn of the right lateral ventricle. No evidence of global anoxic injury.  Repeat study within 2-4 days may be obtained to exclude delayed injury. ?4/29: Echo>>EF estimated 60 to 65%  ?4/29: EEG>>Initial burst suppression pattern consistent with global cerebral dysfunction, medication effect, or both. After propofol was turned off, patient developed generalized  periodic discharges at 1 Hz over a background of diffuse suppression timelocked to full body myoclonus, consistent with myoclonic status epilepticus. ? ?Micro Data:  ?4/28: SARS-CoV-2 PCR> negative ?4/29: MRSA PCR>>negative  ?4/29: Respiratory>>abundant wbc present, predominantly PMN  ? ?Antimicrobials:  ?Unasyn 04/29>> ? ?Subjective:  ?No acute events overnight.  Pt currently in spontaneous mode 12/5 FiO2 28% and tolerating well.  Fentanyl gt infusing '@125'$  mcg/min ? ?OBJECTIVE  ?Blood pressure 139/61, pulse 72, temperature 98.4 ?F (36.9 ?C), temperature source Esophageal, resp. rate 18, height '6\' 3"'$  (1.905 m), weight 116.5 kg, SpO2 95 %. ?   ?Vent Mode: PSV;CPAP ?FiO2 (%):  [28 %-30 %] 28 % ?Set Rate:  [20 bmp] 20 bmp ?Vt Set:  [520 mL] 520 mL ?PEEP:  [5 cmH20] 5 cmH20 ?Pressure Support:  [12 cmH20] 12 cmH20 ?Plateau Pressure:  [18 cmH20-23 cmH20] 23 cmH20  ? ?Intake/Output Summary (Last 24 hours) at 04/09/2022 1610 ?Last data filed at 04/09/2022 0800 ?Gross per 24 hour  ?Intake 6348.22 ml  ?Output 1250 ml  ?Net 5098.22 ml  ? ?Filed Weights  ? 04/06/22 2357  ?Weight: 116.5 kg  ? ?Physical Examination  ?GENERAL:71 year-old critically ill appearing male with myoclonus, NAD mechanically intubated  ?HEENT: Supple, no JVD  ?LUNGS: Clear throughout, even, non labored, orally intubated  ?CARDIOVASCULAR: NSR, s1s2, rrr, no r/g, 2+ radial/1+ distal pulses, no edema  ?ABDOMEN: Soft, non distended, +BS x4. ?EXTREMITIES: Normal bulk and tone ?NEUROLOGIC: Myoclonus present, not following commands, PERRL ?SKIN: No obvious rash, lesion, or ulcer.  ? ?Labs/imaging that I havepersonally reviewed  ?(right click and "Reselect all SmartList Selections" daily)  ? ?  ?Labs   ?CBC: ?Recent Labs  ?Lab 04/06/22 ?2359 04/07/22 ?0423 04/07/22 ?1604 04/08/22 ?9604 04/09/22 ?0431  ?WBC 12.7* 11.0*  --  11.0* 9.1  ?NEUTROABS 6.5  --   --   --  6.9  ?HGB 12.1* 10.8* 10.0* 9.2* 7.7*  ?HCT 38.8* 34.0* 31.6* 28.9* 24.2*  ?MCV 87.4 84.2  --  85.5 85.8   ?PLT 197 218  --  209 150  ? ? ?Basic Metabolic Panel: ?Recent Labs  ?Lab 04/06/22 ?2359 04/07/22 ?0423 04/07/22 ?1102 04/08/22 ?5409 04/09/22 ?0431  ?NA 139 136 141 138 140  ?K 3.5 4.6 4.4 3.8 3.7  ?CL 106 110 108 108 112*  ?CO2 17* 21* 23 21* 23  ?GLUCOSE 273* 233* 209* 193* 184*  ?BUN 31* 34* 37* 38* 32*  ?CREATININE 1.74* 1.81* 2.25* 2.32* 1.83*  ?CALCIUM 8.1* 7.8* 8.1* 7.9* 7.7*  ?MG  --   --  1.4* 2.2 2.1  ?PHOS  --   --  3.9 3.7 2.4*  ? ?GFR: ?Estimated Creatinine Clearance: 50.2 mL/min (A) (by C-G formula based on SCr of 1.83 mg/dL (H)). ?Recent Labs  ?Lab 04/06/22 ?2359 04/07/22 ?0423 04/07/22 ?1100 04/08/22 ?0434 04/09/22 ?0431  ?WBC 12.7* 11.0*  --  11.0* 9.1  ?LATICACIDVEN  --   --  2.2*  --   --   ? ? ?Liver Function Tests: ?Recent Labs  ?Lab 04/06/22 ?2359 04/07/22 ?1102 04/08/22 ?0434  ?AST 218* 362* 225*  ?ALT 184* 189* 137*  ?ALKPHOS 50 41 35*  ?BILITOT 0.4 0.5 0.5  ?PROT 6.8 6.2* 6.3*  ?ALBUMIN 3.5 3.3* 3.2*  ? ?  No results for input(s): LIPASE, AMYLASE in the last 168 hours. ?No results for input(s): AMMONIA in the last 168 hours. ? ?ABG ?   ?Component Value Date/Time  ? PHART 7.31 (L) 04/07/2022 0423  ? PCO2ART 40 04/07/2022 0423  ? PO2ART 161 (H) 04/07/2022 0423  ? HCO3 20.1 04/07/2022 0423  ? TCO2 23 08/22/2018 1239  ? ACIDBASEDEF 5.8 (H) 04/07/2022 0423  ? O2SAT 99.9 04/07/2022 0423  ?  ? ?Coagulation Profile: ?Recent Labs  ?Lab 04/06/22 ?2359  ?INR 1.7*  ? ? ?Cardiac Enzymes: ?No results for input(s): CKTOTAL, CKMB, CKMBINDEX, TROPONINI in the last 168 hours. ? ?HbA1C: ?Hgb A1c MFr Bld  ?Date/Time Value Ref Range Status  ?04/06/2022 11:59 PM 7.6 (H) 4.8 - 5.6 % Final  ?  Comment:  ?  (NOTE) ?Pre diabetes:          5.7%-6.4% ? ?Diabetes:              >6.4% ? ?Glycemic control for   <7.0% ?adults with diabetes ?  ?03/04/2012 01:07 PM 6.6 (H) <5.7 % Final  ?  Comment:  ?  (NOTE) ?                                                                      ?According to the ADA Clinical Practice  Recommendations for 2011, when ?HbA1c is used as a screening test: ? >=6.5%   Diagnostic of Diabetes Mellitus ?          (if abnormal result is confirmed) ?5.7-6.4%   Increased risk of developing Diabetes Mellit

## 2022-04-10 ENCOUNTER — Inpatient Hospital Stay: Payer: Medicare Other

## 2022-04-10 DIAGNOSIS — G931 Anoxic brain damage, not elsewhere classified: Secondary | ICD-10-CM | POA: Diagnosis not present

## 2022-04-10 DIAGNOSIS — R569 Unspecified convulsions: Secondary | ICD-10-CM

## 2022-04-10 DIAGNOSIS — N179 Acute kidney failure, unspecified: Secondary | ICD-10-CM | POA: Diagnosis not present

## 2022-04-10 DIAGNOSIS — J9601 Acute respiratory failure with hypoxia: Secondary | ICD-10-CM | POA: Diagnosis not present

## 2022-04-10 DIAGNOSIS — I469 Cardiac arrest, cause unspecified: Secondary | ICD-10-CM | POA: Diagnosis not present

## 2022-04-10 LAB — CBC WITH DIFFERENTIAL/PLATELET
Abs Immature Granulocytes: 0.03 10*3/uL (ref 0.00–0.07)
Basophils Absolute: 0 10*3/uL (ref 0.0–0.1)
Basophils Relative: 0 %
Eosinophils Absolute: 0.2 10*3/uL (ref 0.0–0.5)
Eosinophils Relative: 2 %
HCT: 26.2 % — ABNORMAL LOW (ref 39.0–52.0)
Hemoglobin: 8.4 g/dL — ABNORMAL LOW (ref 13.0–17.0)
Immature Granulocytes: 0 %
Lymphocytes Relative: 18 %
Lymphs Abs: 1.5 10*3/uL (ref 0.7–4.0)
MCH: 27.5 pg (ref 26.0–34.0)
MCHC: 32.1 g/dL (ref 30.0–36.0)
MCV: 85.9 fL (ref 80.0–100.0)
Monocytes Absolute: 0.8 10*3/uL (ref 0.1–1.0)
Monocytes Relative: 10 %
Neutro Abs: 5.7 10*3/uL (ref 1.7–7.7)
Neutrophils Relative %: 70 %
Platelets: 172 10*3/uL (ref 150–400)
RBC: 3.05 MIL/uL — ABNORMAL LOW (ref 4.22–5.81)
RDW: 15.9 % — ABNORMAL HIGH (ref 11.5–15.5)
WBC: 8.2 10*3/uL (ref 4.0–10.5)
nRBC: 0 % (ref 0.0–0.2)

## 2022-04-10 LAB — TYPE AND SCREEN
ABO/RH(D): A NEG
Antibody Screen: NEGATIVE
Unit division: 0

## 2022-04-10 LAB — CULTURE, RESPIRATORY W GRAM STAIN: Culture: NORMAL

## 2022-04-10 LAB — BASIC METABOLIC PANEL
Anion gap: 7 (ref 5–15)
BUN: 28 mg/dL — ABNORMAL HIGH (ref 8–23)
CO2: 23 mmol/L (ref 22–32)
Calcium: 7.5 mg/dL — ABNORMAL LOW (ref 8.9–10.3)
Chloride: 114 mmol/L — ABNORMAL HIGH (ref 98–111)
Creatinine, Ser: 1.53 mg/dL — ABNORMAL HIGH (ref 0.61–1.24)
GFR, Estimated: 48 mL/min — ABNORMAL LOW (ref >60)
Glucose, Bld: 170 mg/dL — ABNORMAL HIGH (ref 70–99)
Potassium: 3.4 mmol/L — ABNORMAL LOW (ref 3.5–5.1)
Sodium: 144 mmol/L (ref 135–145)

## 2022-04-10 LAB — HEPATIC FUNCTION PANEL
ALT: 57 U/L — ABNORMAL HIGH (ref 0–44)
AST: 80 U/L — ABNORMAL HIGH (ref 15–41)
Albumin: 2.6 g/dL — ABNORMAL LOW (ref 3.5–5.0)
Alkaline Phosphatase: 28 U/L — ABNORMAL LOW (ref 38–126)
Bilirubin, Direct: <0.1 mg/dL (ref 0.0–0.2)
Total Bilirubin: 0.6 mg/dL (ref 0.3–1.2)
Total Protein: 5.9 g/dL — ABNORMAL LOW (ref 6.5–8.1)

## 2022-04-10 LAB — GLUCOSE, CAPILLARY
Glucose-Capillary: 133 mg/dL — ABNORMAL HIGH (ref 70–99)
Glucose-Capillary: 135 mg/dL — ABNORMAL HIGH (ref 70–99)
Glucose-Capillary: 136 mg/dL — ABNORMAL HIGH (ref 70–99)
Glucose-Capillary: 157 mg/dL — ABNORMAL HIGH (ref 70–99)
Glucose-Capillary: 161 mg/dL — ABNORMAL HIGH (ref 70–99)
Glucose-Capillary: 163 mg/dL — ABNORMAL HIGH (ref 70–99)
Glucose-Capillary: 171 mg/dL — ABNORMAL HIGH (ref 70–99)

## 2022-04-10 LAB — BPAM RBC
Blood Product Expiration Date: 202305162359
ISSUE DATE / TIME: 202304301421
Unit Type and Rh: 600

## 2022-04-10 LAB — MAGNESIUM
Magnesium: 2.2 mg/dL (ref 1.7–2.4)
Magnesium: 2.3 mg/dL (ref 1.7–2.4)

## 2022-04-10 LAB — PHOSPHORUS
Phosphorus: 2.4 mg/dL — ABNORMAL LOW (ref 2.5–4.6)
Phosphorus: 3.1 mg/dL (ref 2.5–4.6)

## 2022-04-10 LAB — TRIGLYCERIDES: Triglycerides: 218 mg/dL — ABNORMAL HIGH (ref <150)

## 2022-04-10 IMAGING — MR MR HEAD W/O CM
13 series · 48 of 48 positions shown · non-contrast
Comparison: MRI [DATE].

CLINICAL DATA: Mental status change, unknown cause

Concern for anoxic brain injury.
EXAM:
MRI HEAD WITHOUT CONTRAST
TECHNIQUE: Multiplanar, multiecho pulse sequences of the brain and surrounding
structures were obtained without intravenous contrast.

[Series 5: ax dwi_tracew · axial · 3.0mm · 1.80mm/px · z∈[-14,+126]mm · 2 of 48 slices shown]
[im 1/48]
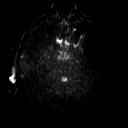
[im 48/48]
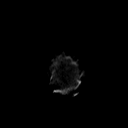

[Series 6: ax dwi_adc · axial · 3.0mm · 1.80mm/px · z∈[-14,+126]mm · 3 of 48 slices shown]
[im 1/48]
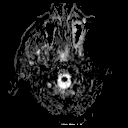
[im 24/48]
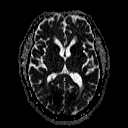
[im 48/48]
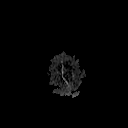

[Series 7: cor dwi_tracew · coronal · 5.0mm · 1.80mm/px · 3 of 38 slices shown]
[im 1/38]
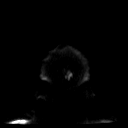
[im 19/38]
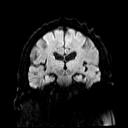
[im 38/38]
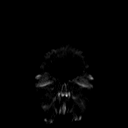

[Series 8: cor dwi_adc · coronal · 5.0mm · 1.80mm/px · 3 of 38 slices shown]
[im 1/38]
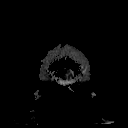
[im 19/38]
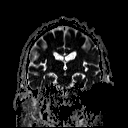
[im 38/38]
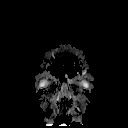

[Series 9: T1 · sagittal · 5.0mm · 0.62mm/px · 2 of 23 slices shown (1 of 2)]
[im 1/23]
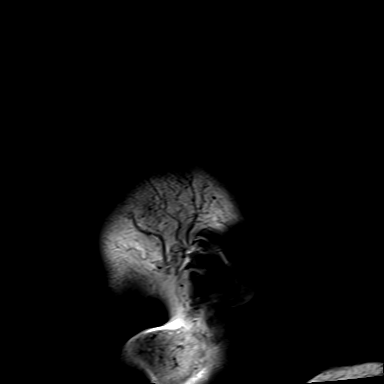
[im 23/23]
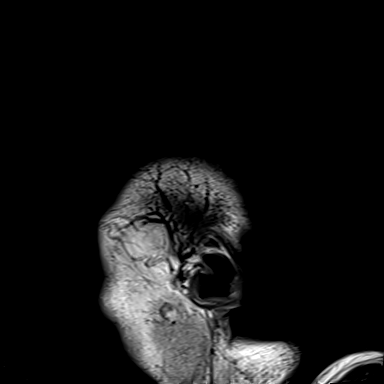

[Series 10: T2 · axial · 5.0mm · 0.86mm/px · z∈[-11,+119]mm · 2 of 25 slices shown (1 of 2)]
[im 1/25]
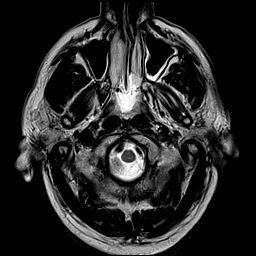
[im 25/25]
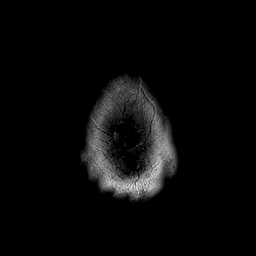

[Series 11: mag_images · axial · 3.0mm · 0.90mm/px · z∈[-14,+125]mm · 4 of 52 slices shown]
[im 1/52]
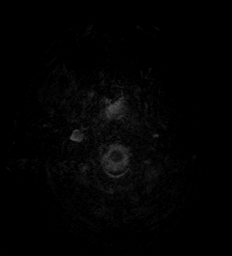
[im 18/52]
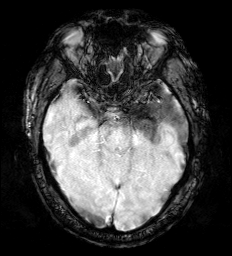
[im 35/52]
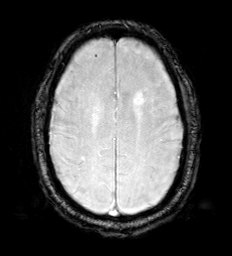
[im 52/52]
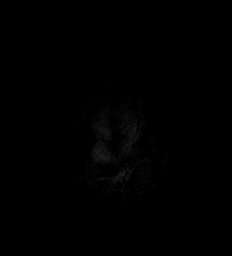

[Series 12: pha_images · axial · 3.0mm · 0.90mm/px · z∈[-14,+125]mm · 4 of 52 slices shown]
[im 1/52]
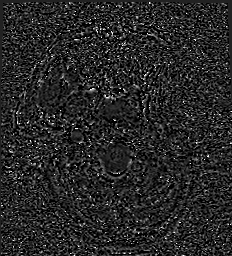
[im 18/52]
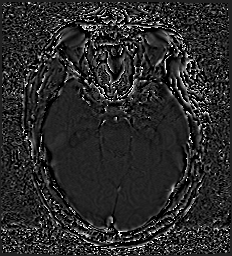
[im 35/52]
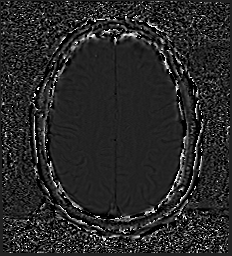
[im 52/52]
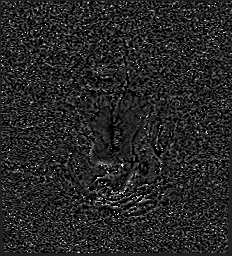

[Series 13: swi_images · axial · 3.0mm · 0.90mm/px · z∈[-14,+125]mm · 4 of 52 slices shown]
[im 1/52]
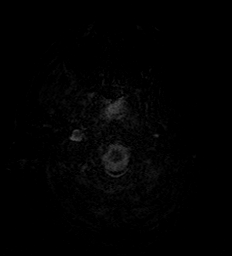
[im 18/52]
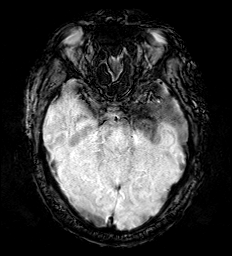
[im 35/52]
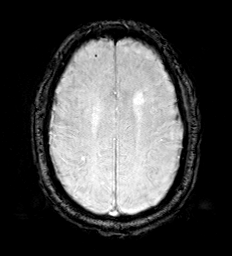
[im 52/52]
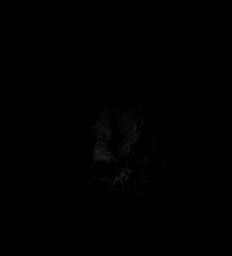

[Series 14: mip_images(sw) · axial · 24.0mm · 0.90mm/px · z∈[-4,+115]mm · 3 of 45 slices shown]
[im 1/45]
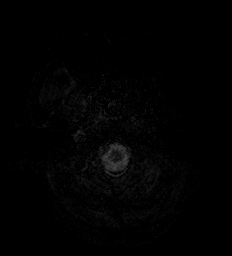
[im 23/45]
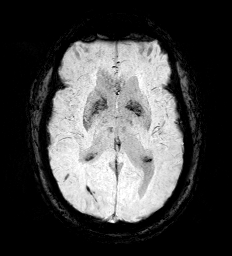
[im 45/45]
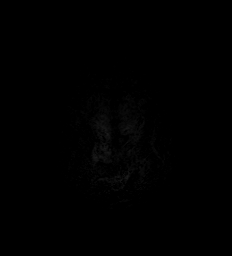

[Series 15: FLAIR · axial · 3.0mm · 0.69mm/px · z∈[-19,+127]mm · 4 of 55 slices shown]
[im 1/55]
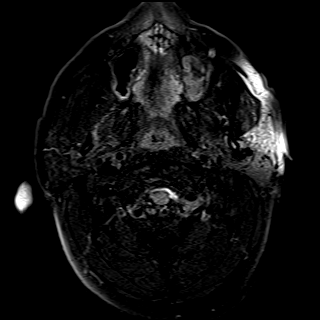
[im 19/55]
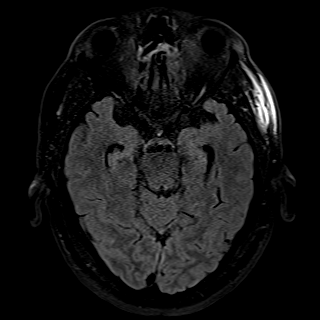
[im 37/55]
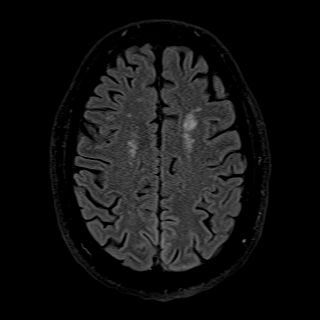
[im 55/55]
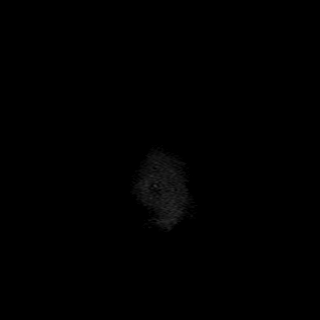

[Series 16: T1 · axial · 1.0mm · 0.98mm/px · z∈[-19,+139]mm · 12 of 176 slices shown (2 of 2)]
[im 1/176]
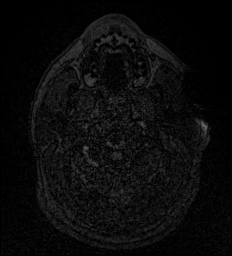
[im 16/176]
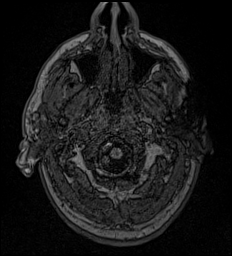
[im 32/176]
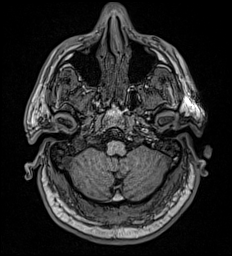
[im 48/176]
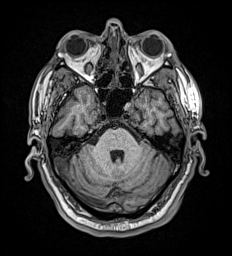
[im 64/176]
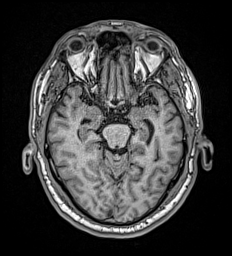
[im 80/176]
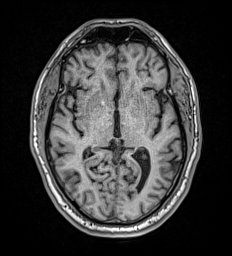
[im 96/176]
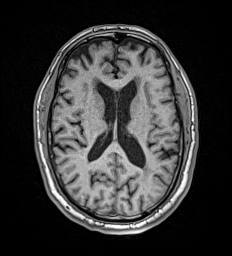
[im 112/176]
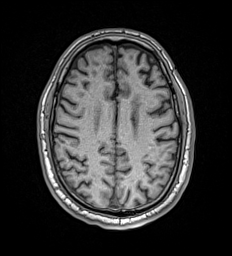
[im 128/176]
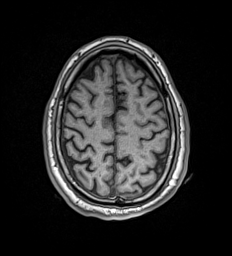
[im 144/176]
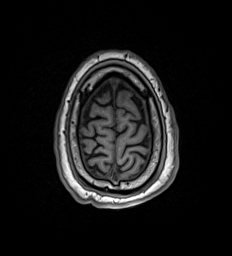
[im 160/176]
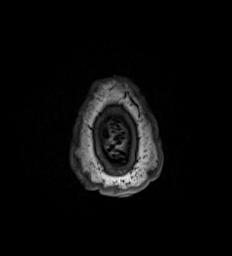
[im 176/176]
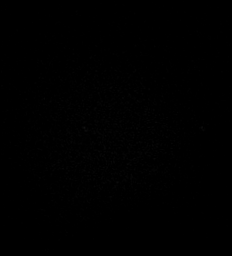

[Series 17: T2 · coronal · 5.0mm · 0.86mm/px · 2 of 29 slices shown (2 of 2)]
[im 1/29]
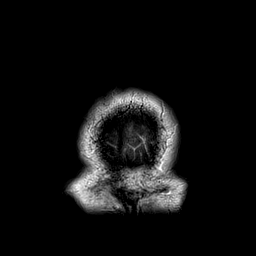
[im 29/29]
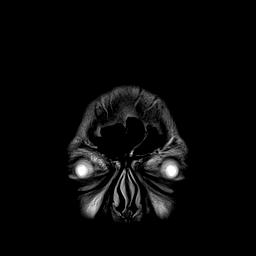

[48 of 48 positions shown; findings below may reference images not displayed]

FINDINGS: Brain: Mild restricted diffusion involving bilateral caudates and
the bilateral parieto-occipital cortex. Evolving small right
cerebellar infarct. There may be slight edema. No significant mass
effect. Additional scattered T2/FLAIR hyperintensities in the white
matter, nonspecific but compatible with chronic microvascular
ischemic disease. Redemonstrated trace intraventricular hemorrhage
in the right occipital horn. Similar linear susceptibility artifact
along the right temporal convexity, which could represent trace
subarachnoid hemorrhage versus thrombus vessel. No hydrocephalus. No
midline shift.

Vascular: Major arterial flow voids are maintained skull base.

Skull and upper cervical spine: Normal marrow signal.

Sinuses/Orbits: Paranasal sinus mucosal thickening. No acute orbital
findings

Other: Bilateral mastoid effusions.
IMPRESSION: 1. Mild restricted diffusion involving bilateral caudate and
parieto-occipital cortex, concerning for hypoxic/ischemic insult
given the clinical history. No significant mass effect.
2. Evolving small right cerebellar infarct.
3. Similar trace intraventricular hemorrhage in the right occipital
horn.

These results will be called to the ordering clinician or
representative by the Radiologist Assistant, and communication
documented in the PACS or [REDACTED].

## 2022-04-10 MED ORDER — POTASSIUM & SODIUM PHOSPHATES 280-160-250 MG PO PACK
2.0000 | PACK | ORAL | Status: AC
  Administered 2022-04-10 (×4): 2
  Filled 2022-04-10 (×4): qty 2

## 2022-04-10 MED ORDER — MIDAZOLAM HCL 2 MG/2ML IJ SOLN: 4.0000 mg | Freq: Once | INTRAMUSCULAR | Status: DC

## 2022-04-10 MED ORDER — MIDAZOLAM HCL 2 MG/2ML IJ SOLN
INTRAMUSCULAR | Status: AC
  Filled 2022-04-10: qty 4

## 2022-04-10 MED ORDER — FREE WATER
30.0000 mL | Status: DC
  Administered 2022-04-10 – 2022-04-11 (×8): 30 mL

## 2022-04-10 MED ORDER — PROSOURCE TF PO LIQD
45.0000 mL | Freq: Three times a day (TID) | ORAL | Status: DC
  Administered 2022-04-10 – 2022-04-11 (×4): 45 mL
  Filled 2022-04-10 (×5): qty 45

## 2022-04-10 MED ORDER — POTASSIUM CHLORIDE 20 MEQ PO PACK
40.0000 meq | PACK | Freq: Once | ORAL | Status: AC
  Administered 2022-04-10: 40 meq
  Filled 2022-04-10: qty 2

## 2022-04-10 MED ORDER — ASPIRIN 81 MG PO CHEW
81.0000 mg | CHEWABLE_TABLET | Freq: Every day | ORAL | Status: DC
  Administered 2022-04-10 – 2022-04-11 (×2): 81 mg
  Filled 2022-04-10 (×2): qty 1

## 2022-04-10 MED ORDER — VITAL AF 1.2 CAL PO LIQD
1000.0000 mL | ORAL | Status: DC
  Administered 2022-04-10 – 2022-04-11 (×2): 1000 mL

## 2022-04-10 NOTE — Progress Notes (Signed)
PHARMACY CONSULT NOTE ? ?Pharmacy Consult for Electrolyte Monitoring and Replacement  ? ?Recent Labs: ?Potassium (mmol/L)  ?Date Value  ?04/10/2022 3.4 (L)  ? ?Magnesium (mg/dL)  ?Date Value  ?04/10/2022 2.2  ? ?Calcium (mg/dL)  ?Date Value  ?04/10/2022 7.5 (L)  ? ?Albumin (g/dL)  ?Date Value  ?04/09/2022 2.9 (L)  ? ?Phosphorus (mg/dL)  ?Date Value  ?04/10/2022 2.4 (L)  ? ?Sodium (mmol/L)  ?Date Value  ?04/10/2022 144  ? ?Corrected Calcium: 8.4 mg/dL ? ?Assessment: 71 y.o with significant PMH of OSA on CPAP, tSAH,T2DM, HTN, HLD, CKD stage III, chronic atrial fibrillation on Xarelto, aortic stenosis, CAD with history of PCI to the LCx and RCA with complicated PCI. In cardiogenic shock.  ? ?Also in AKI on CKD3 secondary to ATN and contrast dye. ? ?Goal of Therapy:  ?Potassium 4.0 - 5.1 mmol/L ?Magnesium 2.0 - 2.4 mg/dL ?All Other Electrolytes WNL ? ?Plan:   ?Phos unchanged at 2.4 after replacement ?Will order Phos-NaK 2 packets q4h x 4 doses per tube ?Potassium low at 3.4 ?Kcl 40 mEq x 1 ordered by medical team. ? ?Wynelle Cleveland, PharmD ?Pharmacy Resident  ?04/10/2022 ?7:21 AM ? ?

## 2022-04-10 NOTE — Progress Notes (Signed)
?   04/10/22 1600  ?Clinical Encounter Type  ?Visited With Family  ?Visit Type Initial;Spiritual support;Social support  ?Referral From Other (Comment) ?(rounding)  ?Spiritual Encounters  ?Spiritual Needs Prayer;Emotional  ? ?Chaplain Burris offered compassionate presence and support to Pt's spouse, Carlynne. Chaplain B provided active listening as she related recent events and current prognosis. Chaplain B also received story of past year that has been very trying with electrocution suffered by their son (nearing one year anniversary on May 6). ? ?Carlynne shared their strong faith and asked for prayer as she discerns next steps. Chaplain B inquired of her needs and urged self-care at this time. Chaplain B also informed her of her availability throughout the night. ?

## 2022-04-10 NOTE — Progress Notes (Signed)
? ? ?NAME:  Jason Neal, MRN:  [id number], DOB:  [birth year], LOS: 3 ?ADMISSION DATE:  04/06/2022, CONSULTATION DATE:  04/07/22 ?REFERRING MD: Hinda Kehr, MD CHIEF COMPLAINT: Cardiac arrest ? ? ?HPI  ?71 y.o with significant PMH of OSA on CPAP, tSAH,T2DM, HTN, HLD, CKD stage III, chronic atrial fibrillation on Xarelto, aortic stenosis, CAD with history of PCI to the LCx and RCA with complicated PCI requiring transfer to [facility] for balloon pump who presented to the ED postcardiac arrest. ? ?Per patient's wife who is currently at the bedside and witnessed the event, patient was sitting in all recliner when he suddenly gasped 3 times for air prior to becoming unresponsive. He was also sweating diffusely so EMS was called who arrived within 5 minutes.  On EMS arrival, patient was found pulseless therefore CPR was initiated and an airway established.  Initial rhythm on the monitor showed PEA therefore patient received Epi x 3 per ACLS protocol for a total of 6 minutes. He apparently required defibrillation x1 after initially being in PEA prior to ROSC being achieved.  A twelve-lead EKG was obtained which showed ST depression in the anterior leads and inferior leads.  Code STEMI was activated on route to the ED. ? ?ED Course: In the emergency department, patient was completely and responsive but breathing on his own with assistance BVM respiration.  The temperature was ?C, the heart rate 86 beats/minute, the blood pressure 132/76 mm Hg, the respiratory rate 20 breaths/minute, and the oxygen saturation 100% on vent.  Rapid sequence intubation was performed in the ED.  Patient remained hemodynamically stable while pending STEMI MD evaluation at the bedside.  Labs obtained per STEMI protocol.  [name] ?Pertinent Labs in Red/Diagnostics Findings: ?Na+/ K+: 139/3.5 ?Glucose: 273 ?BUN/Cr.:  31/1.74 ?CO2: 17 ?Anion gap: 16 ?Calcium: 8.1 ?AST/ALT: 218/184 ?  ?WBC/ TMAX:12.7 / afebrile ?Lactic acid:  ?COVID PCR: Negative ?  ?Troponin:  110 ?Arterial Blood Gas result:  pO2 142; pCO2 35; pH 7.35;  HCO3 19.3, %O2 Sat 99.8.  ? ?Upon evaluation by STEMI on-call MD, patient was taken to the Cath Lab emergently for cardiac catheterization and possible PCI.  Patient was transferred to the ICU post cath and PCCM consulted for further management. ? ?Past Medical History  ?OSA on CPAP, tSAH,T2DM, HTN, HLD, CKD stage III, chronic atrial fibrillation on Xarelto, aortic stenosis, CAD with history of PCI to the LCx and RCA with complicated PCI requiring transfer to [facility] for balloon pump ? ?Significant Hospital Events   ?4/28: Admitted to the ICU with acute ST elevation MI of the lateral wall s/p cath ?4/30: WUA performed myoclonus improved pt unable to follow commands; no response to BUE noxious stimuli; withdraws from pain BLE; Sedation restarted due to vent dyssynchrony, increased work of breathing and agitation/delirium.  Attempted to transition to precedex gtt and prn fentanyl with scheduled oxycodone.  However, pt did not tolerate propofol and fentanyl gtts restarted  ? ?Consults:  ?Cardiology ? ?Procedures:  ?4/28:Intubation ?4/28: Cardiac catheterization ?4/28: Right IJ central line ?4/28: Left radial arterial line ? ?Significant Diagnostic Tests:  ?4/28: Cardiac Cath>>Prox Cx to Mid Cx lesion is 90% stenosed ?Proximal to previously placed stent A drug-eluting stent was ?successfully placed (overlapping previously placed stent) using a STENT ONYX FRONTIER 2.25X26.  Postdilated to 2.6 mm Post intervention, there is a 0% residual stenosis. Previously placed Mid Cx to Dist Cx stent (drug-eluting stent) is  widely patent. Mid LAD lesion is 35% stenosed. 1st Diag lesion is 55%  stenosed.  2nd Diag lesion is 70% stenosed. Previously placed Prox RCA stent (drug- ?eluting stent) is  widely patent. Mid RCA to Dist RCA lesion is 35% stenosed. ?4/28: MRI Brain>>Punctate acute/subacute infarct in the right cerebellar hemisphere. Trace intraventricular hemorrhage in  the occipital horn of the right lateral ventricle. No evidence of global anoxic injury. Repeat study within 2-4 days may be obtained to exclude delayed injury. ?4/29: Echo>>EF estimated 60 to 65%  ?4/29: EEG>>Initial burst suppression pattern consistent with global cerebral dysfunction, medication effect, or both. After propofol was turned off, patient developed generalized periodic discharges at 1 Hz over a background of diffuse suppression timelocked to full body myoclonus, consistent with myoclonic status epilepticus. ? ?Micro Data:  ?4/28: SARS-CoV-2 PCR> negative ?4/29: MRSA PCR>>negative  ?4/29: Respiratory>>negative  ?4/30: Respiratory>> ? ?Antimicrobials:  ?Unasyn 04/29>> ? ?Subjective:  ?No acute events overnight.  Pt currently sedated with propofol and fentanyl gtts  ? ?OBJECTIVE  ?Blood pressure (!) 112/53, pulse (!) 58, temperature 98.8 ?F (37.1 ?C), resp. rate 20, height '6\' 3"'$  (1.905 m), weight 110.5 kg, SpO2 100 %. ?   ?Vent Mode: PRVC ?FiO2 (%):  [28 %-40 %] 32 % ?Set Rate:  [20 bmp] 20 bmp ?Vt Set:  [500 mL-520 mL] 520 mL ?PEEP:  [5 cmH20] 5 cmH20 ?Plateau Pressure:  [17 cmH20-21 cmH20] 17 cmH20  ? ?Intake/Output Summary (Last 24 hours) at 04/10/2022 0950 ?Last data filed at 04/10/2022 4650 ?Gross per 24 hour  ?Intake 1575.3 ml  ?Output 1285 ml  ?Net 290.3 ml  ? ?Filed Weights  ? 04/06/22 2357 04/10/22 0500  ?Weight: 116.5 kg 110.5 kg  ? ?Physical Examination  ?GENERAL:71 year-old critically ill appearing male, NAD mechanically intubated  ?HEENT: Supple, no JVD  ?LUNGS: Clear throughout, even, non labored, orally intubated  ?CARDIOVASCULAR: NSR, s1s2, rrr, no r/g, 2+ radial/1+ distal pulses, no edema  ?ABDOMEN: Soft, non distended, +BS x4. ?EXTREMITIES: Normal bulk and tone ?NEUROLOGIC: Sedated, not following commands,  PERRL ?SKIN: No obvious rash, lesion, or ulcer.  ? ?Labs/imaging that I havepersonally reviewed  ?(right click and "Reselect all SmartList Selections" daily)  ? ?  ?Labs   ?CBC: ?Recent  Labs  ?Lab 04/06/22 ?2359 04/07/22 ?0423 04/07/22 ?1604 04/08/22 ?3546 04/09/22 ?5681 04/09/22 ?1830 04/10/22 ?0337  ?WBC 12.7* 11.0*  --  11.0* 9.1  --  8.2  ?NEUTROABS 6.5  --   --   --  6.9  --  5.7  ?HGB 12.1* 10.8* 10.0* 9.2* 7.7* 8.7* 8.4*  ?HCT 38.8* 34.0* 31.6* 28.9* 24.2* 27.2* 26.2*  ?MCV 87.4 84.2  --  85.5 85.8  --  85.9  ?PLT 197 218  --  209 150  --  172  ? ? ?Basic Metabolic Panel: ?Recent Labs  ?Lab 04/07/22 ?0423 04/07/22 ?1102 04/08/22 ?0434 04/09/22 ?0431 04/09/22 ?1830 04/10/22 ?0337  ?NA 136 141 138 140  --  144  ?K 4.6 4.4 3.8 3.7  --  3.4*  ?CL 110 108 108 112*  --  114*  ?CO2 21* 23 21* 23  --  23  ?GLUCOSE 233* 209* 193* 184*  --  170*  ?BUN 34* 37* 38* 32*  --  28*  ?CREATININE 1.81* 2.25* 2.32* 1.83*  --  1.53*  ?CALCIUM 7.8* 8.1* 7.9* 7.7*  --  7.5*  ?MG  --  1.4* 2.2 2.1 2.0 2.2  ?PHOS  --  3.9 3.7 2.4* 2.9 2.4*  ? ?GFR: ?Estimated Creatinine Clearance: 58.6 mL/min (A) (by C-G formula based on SCr  of 1.53 mg/dL (H)). ?Recent Labs  ?Lab 04/07/22 ?0423 04/07/22 ?1100 04/08/22 ?0434 04/09/22 ?0431 04/10/22 ?0337  ?WBC 11.0*  --  11.0* 9.1 8.2  ?LATICACIDVEN  --  2.2*  --   --   --   ? ? ?Liver Function Tests: ?Recent Labs  ?Lab 04/06/22 ?2359 04/07/22 ?1102 04/08/22 ?0434 04/09/22 ?0431  ?AST 218* 362* 225* 115*  ?ALT 184* 189* 137* 85*  ?ALKPHOS 50 41 35* 30*  ?BILITOT 0.4 0.5 0.5 0.7  ?PROT 6.8 6.2* 6.3* 6.1*  ?ALBUMIN 3.5 3.3* 3.2* 2.9*  ? ?No results for input(s): LIPASE, AMYLASE in the last 168 hours. ?No results for input(s): AMMONIA in the last 168 hours. ? ?ABG ?   ?Component Value Date/Time  ? PHART 7.31 (L) 04/07/2022 0423  ? PCO2ART 40 04/07/2022 0423  ? PO2ART 161 (H) 04/07/2022 0423  ? HCO3 20.1 04/07/2022 0423  ? TCO2 23 08/22/2018 1239  ? ACIDBASEDEF 5.8 (H) 04/07/2022 0423  ? O2SAT 99.9 04/07/2022 0423  ?  ? ?Coagulation Profile: ?Recent Labs  ?Lab 04/06/22 ?2359  ?INR 1.7*  ? ? ?Cardiac Enzymes: ?No results for input(s): CKTOTAL, CKMB, CKMBINDEX, TROPONINI in the last 168  hours. ? ?HbA1C: ?Hgb A1c MFr Bld  ?Date/Time Value Ref Range Status  ?04/06/2022 11:59 PM 7.6 (H) 4.8 - 5.6 % Final  ?  Comment:  ?  (NOTE) ?Pre diabetes:          5.7%-6.4% ? ?Diabetes:              >6.

## 2022-04-10 NOTE — Progress Notes (Signed)
? ?Progress Note ? ?Patient Name: Jason Neal ?Date of Encounter: 04/10/2022 ? ?[facility] Cardiologist: Quay Burow, MD  ? ?Subjective  ? ?The patient had significant agitation and elevated blood pressure off sedation but he appears to be stable today from a cardiac standpoint.  He is getting repeat EEG and brain MRI.  Wife and daughter at bedside and I had a prolonged discussion with them. ? ?Inpatient Medications  ?  ?Scheduled Meds: ? aspirin  81 mg Per Tube Daily  ? atorvastatin  80 mg Per Tube Daily  ? B-complex with vitamin C  1 tablet Per Tube QODAY  ? chlorhexidine gluconate (MEDLINE KIT)  15 mL Mouth Rinse BID  ? Chlorhexidine Gluconate Cloth  6 each Topical Daily  ? clopidogrel  75 mg Per Tube Q breakfast  ? docusate  100 mg Per Tube BID  ? feeding supplement (PROSource TF)  45 mL Per Tube TID  ? free water  30 mL Per Tube Q4H  ? insulin aspart  0-9 Units Subcutaneous Q4H  ? ipratropium  2 spray Each Nare TID  ? mouth rinse  15 mL Mouth Rinse 10 times per day  ? midazolam  4 mg Intravenous Once  ? oxyCODONE  5 mg Per Tube Q6H  ? pantoprazole sodium  40 mg Per Tube Daily  ? polyethylene glycol  17 g Per Tube Daily  ? potassium & sodium phosphates  2 packet Per Tube Q4H  ? sodium chloride  2 spray Each Nare BID  ? sodium chloride flush  3 mL Intravenous Q12H  ? ?Continuous Infusions: ? sodium chloride 10 mL/hr at 04/07/22 2000  ? sodium chloride    ? ampicillin-sulbactam (UNASYN) IV Stopped (04/10/22 1544)  ? feeding supplement (VITAL AF 1.2 CAL) 1,000 mL (04/10/22 1602)  ? fentaNYL infusion INTRAVENOUS 125 mcg/hr (04/10/22 1809)  ? levETIRAcetam Stopped (04/10/22 1229)  ? norepinephrine (LEVOPHED) Adult infusion Stopped (04/10/22 6010)  ? propofol (DIPRIVAN) infusion 30 mcg/kg/min (04/10/22 1809)  ? vasopressin Stopped (04/07/22 2305)  ? ?PRN Meds: ?sodium chloride, acetaminophen, albuterol, fentaNYL (SUBLIMAZE) injection, fluticasone, midazolam, nitroGLYCERIN, ondansetron (ZOFRAN) IV, polyvinyl  alcohol, sodium chloride flush  ? ?Vital Signs  ?  ?Vitals:  ? 04/10/22 1609 04/10/22 1700 04/10/22 1730 04/10/22 1800  ?BP:  (!) 109/50 (!) 95/47 (!) 106/50  ?Pulse:  (!) 57 (!) 56 (!) 57  ?Resp:  $Remov'20 20 20  'NyJKzu$ ?Temp: 97.9 ?F (36.6 ?C)     ?TempSrc: Axillary     ?SpO2:  100% 100% 100%  ?Weight:      ?Height:      ? ? ?Intake/Output Summary (Last 24 hours) at 04/10/2022 1838 ?Last data filed at 04/10/2022 1809 ?Gross per 24 hour  ?Intake 1659.19 ml  ?Output 1345 ml  ?Net 314.19 ml  ? ? ? ?  04/10/2022  ?  5:00 AM 04/06/2022  ? 11:57 PM 08/02/2021  ?  7:51 AM  ?Last 3 Weights  ?Weight (lbs) 243 lb 9.7 oz 256 lb 13.4 oz 238 lb  ?Weight (kg) 110.5 kg 116.5 kg 107.956 kg  ?   ? ?Telemetry  ?  ?Sinus rhythm, heart rate 72- Personally Reviewed ? ?ECG  ?  ? - Personally Reviewed ? ?Physical Exam  ? ?GEN: Intubated, on fentanyl ?Neck: No JVD ?Cardiac: RRR, no murmurs, rubs, or gallops.  ?Respiratory: Vented breath sounds ?GI: Soft, nontender, non-distended  ?MS: No edema; No deformity. ?Neuro: Unable to assess ?Psych: Unable to assess ? ?Labs  ?  ?High  Sensitivity Troponin:   ?Recent Labs  ?Lab 04/06/22 ?2359 04/07/22 ?0423 04/07/22 ?8119  ?TROPONINIHS 110* 21,580* >24,000*  ? ?   ?Chemistry ?Recent Labs  ?Lab 04/08/22 ?0434 04/09/22 ?0431 04/09/22 ?1830 04/10/22 ?1478 04/10/22 ?1621  ?NA 138 140  --  144  --   ?K 3.8 3.7  --  3.4*  --   ?CL 108 112*  --  114*  --   ?CO2 21* 23  --  23  --   ?GLUCOSE 193* 184*  --  170*  --   ?BUN 38* 32*  --  28*  --   ?CREATININE 2.32* 1.83*  --  1.53*  --   ?CALCIUM 7.9* 7.7*  --  7.5*  --   ?MG 2.2 2.1 2.0 2.2 2.3  ?PROT 6.3* 6.1*  --  5.9*  --   ?ALBUMIN 3.2* 2.9*  --  2.6*  --   ?AST 225* 115*  --  80*  --   ?ALT 137* 85*  --  57*  --   ?ALKPHOS 35* 30*  --  28*  --   ?BILITOT 0.5 0.7  --  0.6  --   ?GFRNONAA 29* 39*  --  48*  --   ?ANIONGAP 9 5  --  7  --   ? ?  ?Lipids  ?Recent Labs  ?Lab 04/06/22 ?2359 04/08/22 ?0434 04/10/22 ?0337  ?CHOL 140  --   --   ?TRIG 221*   < > 218*  ?HDL 34*  --   --    ?LDLCALC 62  --   --   ?CHOLHDL 4.1  --   --   ? < > = values in this interval not displayed.  ? ?  ?Hematology ?Recent Labs  ?Lab 04/08/22 ?0434 04/09/22 ?0431 04/09/22 ?1830 04/10/22 ?0337  ?WBC 11.0* 9.1  --  8.2  ?RBC 3.38* 2.82*  --  3.05*  ?HGB 9.2* 7.7* 8.7* 8.4*  ?HCT 28.9* 24.2* 27.2* 26.2*  ?MCV 85.5 85.8  --  85.9  ?MCH 27.2 27.3  --  27.5  ?MCHC 31.8 31.8  --  32.1  ?RDW 16.0* 15.9*  --  15.9*  ?PLT 209 150  --  172  ? ? ?Thyroid No results for input(s): TSH, FREET4 in the last 168 hours.  ?BNPNo results for input(s): BNP, PROBNP in the last 168 hours.  ?DDimer No results for input(s): DDIMER in the last 168 hours.  ? ?Radiology  ?  ?MR BRAIN WO CONTRAST ? ?Result Date: 04/10/2022 ?CLINICAL DATA:  Mental status change, unknown cause Concern for anoxic brain injury. EXAM: MRI HEAD WITHOUT CONTRAST TECHNIQUE: Multiplanar, multiecho pulse sequences of the brain and surrounding structures were obtained without intravenous contrast. COMPARISON:  MRI April 07, 2022. FINDINGS: Brain: Mild restricted diffusion involving bilateral caudates and the bilateral parieto-occipital cortex. Evolving small right cerebellar infarct. There may be slight edema. No significant mass effect. Additional scattered T2/FLAIR hyperintensities in the white matter, nonspecific but compatible with chronic microvascular ischemic disease. Redemonstrated trace intraventricular hemorrhage in the right occipital horn. Similar linear susceptibility artifact along the right temporal convexity, which could represent trace subarachnoid hemorrhage versus thrombus vessel. No hydrocephalus. No midline shift. Vascular: Major arterial flow voids are maintained skull base. Skull and upper cervical spine: Normal marrow signal. Sinuses/Orbits: Paranasal sinus mucosal thickening. No acute orbital findings Other: Bilateral mastoid effusions. IMPRESSION: 1. Mild restricted diffusion involving bilateral caudate and parieto-occipital cortex, concerning for  hypoxic/ischemic insult given the clinical history. No significant mass  effect. 2. Evolving small right cerebellar infarct. 3. Similar trace intraventricular hemorrhage in the right occipital horn. These results will be called to the ordering clinician or representative by the Radiologist Assistant, and communication documented in the PACS or [employer]. Electronically Signed   By: Margaretha Sheffield M.D.   On: 04/10/2022 15:00  ? ?DG Chest Port 1 View ? ?Result Date: 04/09/2022 ?CLINICAL DATA:  Acute respiratory failure EXAM: PORTABLE CHEST 1 VIEW COMPARISON:  04/08/2022 FINDINGS: Endotracheal tube, enteric tube, and right IJ central line again identified. Decreased small left pleural effusion with improved aeration at the left lung base. Stable cardiomediastinal contours. IMPRESSION: Stable lines and tubes. Decreased small left pleural effusion and improved aeration at the left lung base. Electronically Signed   By: Macy Mis M.D.   On: 04/09/2022 10:32  ? ?EEG adult ? ?Result Date: 04/10/2022 ?Lora Havens, MD     04/10/2022  4:25 PM Patient Name: Jason Neal MRN: [id number] Epilepsy Attending: Lora Havens Referring Physician/Provider: Derek Jack, MD Date: 04/10/2022 Duration: 26.30 mins Patient history: 71 year old man status post cardiac arrest secondary to STEMI with EEG 24 hours out from the event with myoclonic status epilepticus that improved clinically on Keppra along with sedation on propofol.  EEG to evaluate for seizure Level of alertness: comatose AEDs during EEG study: LEV, propofol Technical aspects: This EEG study was done with scalp electrodes positioned according to the 10-20 International system of electrode placement. Electrical activity was acquired at a sampling rate of $Remov'500Hz'WByWuR$  and reviewed with a high frequency filter of $RemoveB'70Hz'sOilaZzg$  and a low frequency filter of $RemoveB'1Hz'UGgsULbE$ . EEG data were recorded continuously and digitally stored. Description: EEG showed generalized periodic discharges  (GPDs) at  1.5-$Remov'2Hz'qgwwOh$ , more prominent when awake/stimulated. In between [place], eeg showed generalized eeg suppression which was reactive to stimulation. Hyperventilation and photic stimulation were not per

## 2022-04-10 NOTE — Consult Note (Signed)
NEUROLOGY CONSULTATION NOTE  ? ?Date of service: Apr 10, 2022 ?Patient Name: Jason Neal ?MRN:  [id number] ?DOB:  [birth year] ?Reason for consult: prognostication after cardiac arrest ?Requesting physician: Dr. Flora Lipps ?_ _ _   _ __   _ __ _ _  __ __   _ __   __ _ ? ?History of Present Illness  ? ?This is a 71 yo gentleman with hx OSA on CPAP, tSAH, DM2, HTN, HL, CKD stage III, a fib on xarelto, AS, CAD with hx PCI to Lcx and RCA with complicated PCI requiring transfer to [facility] for balloon pump who is admitted following cardiac arrest. Patient became unresponsive while sitting in recliner at home. EMS arrived within 5 minutes and were unable to get a pulse so CPR was initiated. Initial rhythm was PEA and he received epi x3 per ACLS protocol for total of 6 min. He then converted to shockable rhythm and was defibrillated once and ROSC was obtained. He was found to have a STEMI and taken urgently to the cath lab immediately upon arrival to ED. ? ?Personal review of MRI brain wo contrast showed no definitive e/o anoxic brain injury ? ?When sedation is lightened patient develops full body myoclonus q 2 seconds ?  ?ROS  ? ?UTA 2/2 coma ? ?Past History  ? ?I have reviewed the following: ? ?Past Medical History:  ?Diagnosis Date  ? Allergies   ? HAS ALBUTEROL INHALER  ? Aortic valve stenosis   ? mild  ? Brain bleed ([place]) 08/2018  ? DUE TO FALL AND HIT HEAD-BLEED RESOLVED ON ITS OWN  ? CAD (coronary artery disease)   ? prior CFX/RCA PCI 2011, cath [facility] 3/13  ? Cancer [facility]) 2017  ? MELANOMA RIGHT EAR  ? Carotid bruit 03/14/11  ? CAROTID DUPLEX DOPPLER-Right bulb; demonstrated a mild amount of fibrous plaque w/o evidence of a significant diameter reduction, dissection or any other vascular abnormality.;Left ICA;-proximal, mid and distal segments of the internal carotid artery normal patency.mid and distal segments demonstrating moderate tortuosity..Mildly abnormal carotid duplex scan.  ? Chronic renal insufficiency, stage III  (moderate) (HCC)   ? DJD (degenerative joint disease)   ? c-spine  ? Dyslipidemia   ? Dysrhythmia   ? GERD (gastroesophageal reflux disease)   ? Heart murmur   ? History of kidney stones   ? X1  ? Hypercholesteremia   ? Hypertension   ? PAF (paroxysmal atrial fibrillation) [facility]) March 2013  ? Sinus headache   ? Sleep apnea   ? on C-Pap  ? Type 2 diabetes mellitus with chronic kidney disease ([place])   ? diet controlled  ? ?Past Surgical History:  ?Procedure Laterality Date  ? C-spine surgery  2001  ? cardiac catherization  03/04/12  ? Dr Gwenlyn Found  ? CARDIOVERSION  03/07/2012  ? Procedure: CARDIOVERSION;  Surgeon: Sanda Klein, MD;  Location: [facility] ENDOSCOPY;  Service: Cardiovascular;  Laterality: N/A;  ? CATARACT EXTRACTION W/PHACO Right 06/25/2019  ? Procedure: CATARACT EXTRACTION PHACO AND INTRAOCULAR LENS PLACEMENT ([place]) RIGHT;  Surgeon: Leandrew Koyanagi, MD;  Location: [place];  Service: Ophthalmology;  Laterality: Right;  Diabetic - oral meds ?sleep apnea  ? CATARACT EXTRACTION W/PHACO Left 07/23/2019  ? Procedure: CATARACT EXTRACTION PHACO AND INTRAOCULAR LENS PLACEMENT ([place]) LEFT DIABETIC;  Surgeon: Leandrew Koyanagi, MD;  Location: [place];  Service: Ophthalmology;  Laterality: Left;  Diabetic - oral meds  ? COLONOSCOPY WITH PROPOFOL N/A 01/05/2017  ? Procedure: COLONOSCOPY WITH PROPOFOL;  Surgeon:  Lollie Sails, MD;  Location: [facility] ENDOSCOPY;  Service: Endoscopy;  Laterality: N/A;  ? CORONARY ANGIOGRAPHY N/A 04/07/2022  ? Procedure: CORONARY ANGIOGRAPHY;  Surgeon: Leonie Man, MD;  Location: [place] CV LAB;  Service: Cardiovascular;  Laterality: N/A;  ? CORONARY ANGIOPLASTY WITH STENT PLACEMENT  Dec 2011  ? [place]  ? CORONARY/GRAFT ACUTE MI REVASCULARIZATION N/A 04/07/2022  ? Procedure: Coronary/Graft Acute MI Revascularization;  Surgeon: Leonie Man, MD;  Location: [place] CV LAB;  Service: Cardiovascular;  Laterality: N/A;  ? HERNIA REPAIR  1999  ?  INGUINAL HERNIA REPAIR Left 12/17/2019  ? Procedure: HERNIA REPAIR INGUINAL ADULT;  Surgeon: Robert Bellow, MD;  Location: [facility] ORS;  Service: General;  Laterality: Left;  ? LEFT HEART CATHETERIZATION WITH CORONARY ANGIOGRAM N/A 03/04/2012  ? Procedure: LEFT HEART CATHETERIZATION WITH CORONARY ANGIOGRAM;  Surgeon: Lorretta Harp, MD;  Location: [facility] CATH LAB;  Service: Cardiovascular;  Laterality: N/A;  ? TEE WITHOUT CARDIOVERSION  03/07/2012  ? Procedure: TRANSESOPHAGEAL ECHOCARDIOGRAM (TEE);  Surgeon: Sanda Klein, MD;  Location: [place];  Service: Cardiovascular;  Laterality: N/A;  ? ?History reviewed. No pertinent family history. ?Social History  ? ?Socioeconomic History  ? Marital status: Married  ?  Spouse name: Not on file  ? Number of children: Not on file  ? Years of education: Not on file  ? Highest education level: Not on file  ?Occupational History  ? Not on file  ?Tobacco Use  ? Smoking status: Never  ? Smokeless tobacco: Never  ?Vaping Use  ? Vaping Use: Never used  ?Substance and Sexual Activity  ? Alcohol use: No  ? Drug use: Never  ? Sexual activity: Not on file  ?Other Topics Concern  ? Not on file  ?Social History Narrative  ? Not on file  ? ?Social Determinants of Health  ? ?Financial Resource Strain: Not on file  ?Food Insecurity: Not on file  ?Transportation Needs: Not on file  ?Physical Activity: Not on file  ?Stress: Not on file  ?Social Connections: Not on file  ? ?Allergies  ?Allergen Reactions  ? Angiotensin Receptor Blockers Cough  ?  (06/18/19 pt denies)  ? Demeclocycline Other (See Comments)  ?  AS ACHILD  ? ? ?Medications  ? ?Medications Prior to Admission  ?Medication Sig Dispense Refill Last Dose  ? hydrALAZINE (APRESOLINE) 50 MG tablet Take 50 mg by mouth 2 (two) times daily.     ? ACCU-CHEK AVIVA PLUS test strip      ? albuterol (VENTOLIN HFA) 108 (90 Base) MCG/ACT inhaler Inhale 1-2 puffs into the lungs every 6 (six) hours as needed for wheezing or shortness of breath.     ?  atorvastatin (LIPITOR) 80 MG tablet Take 80 mg by mouth every evening.     ? B Complex-C (B-COMPLEX WITH VITAMIN C) tablet Take 1 tablet by mouth every other day.      ? carvedilol (COREG) 6.25 MG tablet Take 6.25 mg by mouth 2 (two) times daily.      ? cetirizine (ZYRTEC) 10 MG tablet Take 10 mg by mouth every evening.     ? cholecalciferol (VITAMIN D) 1000 UNITS tablet Take 1,000-2,000 Units by mouth See admin instructions. Take 1000 units in the morning and 2000 units in the evening     ? diltiazem (CARDIZEM CD) 180 MG 24 hr capsule Take 180 mg by mouth every morning.      ? fluticasone (FLONASE) 50 MCG/ACT nasal spray Place 1  spray into both nostrils daily as needed for allergies.      ? glipiZIDE-metformin (METAGLIP) 2.5-500 MG tablet Take 1 tablet by mouth 2 (two) times daily before a meal.     ? ipratropium (ATROVENT) 0.06 % nasal spray Place 2 sprays into both nostrils 3 (three) times daily as needed for rhinitis.     ? Lancets (ACCU-CHEK MULTICLIX) lancets      ? losartan (COZAAR) 100 MG tablet TAKE 1 TABLET DAILY (Patient taking differently: Take 100 mg by mouth every morning.) 90 tablet 0   ? Multiple Vitamin (MULTIVITAMIN WITH MINERALS) TABS tablet Take 1 tablet by mouth every other day.     ? nitroGLYCERIN (NITROSTAT) 0.4 MG SL tablet Place 0.4 mg under the tongue every 5 (five) minutes as needed for chest pain.     ? ofloxacin (FLOXIN) 0.3 % OTIC solution Place 5 drops into both ears as needed.     ? Omega-3 Fatty Acids (FISH OIL) 1200 MG CAPS Take 1,200 mg by mouth daily.     ? pantoprazole (PROTONIX) 40 MG tablet Take 40 mg by mouth every evening.      ? Polyethyl Glycol-Propyl Glycol 0.4-0.3 % SOLN Place 1 drop into both eyes as needed (dry eyes).      ? polyethylene glycol (MIRALAX / GLYCOLAX) 17 g packet Take 17 g by mouth daily as needed.      ? rivaroxaban (XARELTO) 20 MG TABS tablet Take 1 tablet (20 mg total) by mouth daily with supper. 30 tablet 0   ? sodium chloride (OCEAN) 0.65 % SOLN nasal  spray Place 2 sprays into both nostrils 2 (two) times daily.  0   ?  ? ? ?Current Facility-Administered Medications:  ?  0.9 %  sodium chloride infusion, , Intravenous, Continuous, Leonie Man, MD, L

## 2022-04-10 NOTE — Progress Notes (Signed)
Eeg done 

## 2022-04-10 NOTE — Progress Notes (Addendum)
Nutrition Follow-up ? ?DOCUMENTATION CODES:  ? ?Obesity unspecified ? ?INTERVENTION:  ? ?Change to Vital 1.2_0 /hr + ProSource TF 70m TID via tube ? ?Propofol: 13.98 ml/hr- provides 369kcal/day  ? ?Free water flushes 325mq4 hours to maintain tube patency  ? ?Regimen provides 2361kcal/day, 150g/day protein and 144520may of free water ? ?NUTRITION DIAGNOSIS:  ? ?Inadequate oral intake related to inability to eat as evidenced by NPO status. ? ?GOAL:  ? ?Provide needs based on ASPEN/SCCM guidelines ?-met  ? ?MONITOR:  ? ?Vent status, Labs, Weight trends, TF tolerance, Skin, I & O's ? ?ASSESSMENT:  ? ?[age]o with significant PMH of OSA on CPAP, SAH, T2DM, HTN, HLD, CKD stage III, chronic atrial fibrillation on Xarelto, aortic stenosis, CAD with history of PCI to the LCx and RCA with complicated PCI requiring transfer to [facility] for balloon pump and DJD who is admitted with STEMI and possible aspiration event. ? ?Pt remains sedated and ventilated. Tube feed protocol initiated over the weekend and pt tolerated well. Tube feeds currently on hold for MRI of the brian. Will plan to restart tube feeds after MRI. Per chart, pt is down 13lbs since admission. Pt +6.6L on his I & Os. No BM since 4/28.  ? ?Medications reviewed and include: B complex w/ C, plavix, colace, insulin, oxycodone, protonix, miralax, Kphos, unasyn ? ?Labs reviewed: K 3.4(L), BUN 28(H), creat 1.53(H), P 2.4(L), Mg 2.2 wnl ?Hgb 8.4(L), Hct 26.2(L) ?Cbgs- 157, 161, 171 x 24 hrs ? ?Patient is currently intubated on ventilator support ?MV: 10.3 L/min ?Temp (24hrs), Avg:99.4 ?F (37.4 ?C), Min:98.2 ?F (36.8 ?C), Max:100.4 ?F (38 ?C) ? ?Propofol: 13.98 ml/hr- provides 369kcal/day  ? ?MAP- >70m75m? ?UOP- 1485ml54m?Diet Order:   ?Diet Order   ? ?       ?  Diet NPO time specified  Diet effective now       ?  ? ?  ?  ? ?  ? ?EDUCATION NEEDS:  ? ?No education needs have been identified at this time ? ?Skin:  Skin Assessment: Reviewed RN Assessment ? ?Last BM:   4/28 ? ?Height:  ? ?Ht Readings from Last 1 Encounters:  ?04/06/22 _1  (1.905 m)  ? ? ?Weight:  ? ?Wt Readings from Last 1 Encounters:  ?04/10/22 110.5 kg  ? ? ?Ideal Body Weight:  89.1 kg ? ?BMI:  Body mass index is 30.45 kg/m?. ? ?Estimated Nutritional Needs:  ? ?Kcal:  2320kcal/day ? ?Protein:  135-150g/day ? ?Fluid:  2.3-2.6L/day ? ?CaseyKoleen DistanceRD, LDN ?Please refer to AMION for RD and/or RD on-call/weekend/after hours pager ? ?

## 2022-04-10 NOTE — Progress Notes (Addendum)
S: NAEON. Myoclonus improved after increasing keppra to '1000mg'$  bid. ? ?EEG 4/28 c/w myoclonic status when propofol paused ? ?O: ? ?Vitals:  ? 04/10/22 0100 04/10/22 0200  ?BP: (!) 118/58 (!) 108/53  ?Pulse: (!) 59 64  ?Resp: 20 20  ?Temp: 99.3 ?F (37.4 ?C) 99.3 ?F (37.4 ?C)  ?SpO2: 100% 100%  ? ?Sedation paused for one hour prior to examination ? ?Gen: stirs in response to vigorous noxious stimuli, no response to commands, no purposeful movement ?CV: RRR ?Resp: ventilated ? ?MS: no response to commands, no purposeful movement ?Speech: intubated ?CN: PERRL, (+) corneals, oculocephalics, cough, gag, face symmetric at rest ?Motor & sensory: withdraws in response to noxious stimuli BLE, no movement BUE ?Reflexes: 1+ symm throughout, toes up on L, mute on R ? ?A/P: 71 yo man s/p cardiac arrest 2/2 STEMI. EEG 24 hrs out from event showed myoclonic status epilepticus, clinically improved on keppra '1000mg'$  bid. Plan for repeat EEG Monday. MRI brain does not show definitive e/o anoxic brain injury. Given that there is no response in BUE to noxious stimuli, plan for repeat MRI brain overnight to r/o interval watershed infarcts. Sedation had to be restarted after exam today 2/2 vent dyssynchrony. I have told his family that given that he is slowly improving on exam each day, and we have been unable to pause sedation for more than one hour, it is soon to do formal prognostication exam. They are aware that myoclonic status after cardiac arrest is associated with worse outcomes, although his current examination (intact brainstem reflexes and BLE withdrawal to noxious stimuli) and MRI brain without definitive e/o anoxic brain injury is more favorable. ? ?- Continue keppra '1000mg'$  q 12 hrs ?- Repeat MRI brain wo contrast ?- repeat rEEG Mon ?- Wean sedation as able ?- Plan for exam off sedation tmrw ? ?This patient is critically ill and at significant risk of neurological worsening, death and care requires constant monitoring of vital  signs, hemodynamics,respiratory and cardiac monitoring, neurological assessment, discussion with family, other specialists and medical decision making of high complexity. I spent 45 minutes of neurocritical care time  in the care of  this patient. This was time spent independent of any time provided by nurse practitioner or PA. ? ?Su Monks, MD ?[facility] ?[phone] ? ?If 7pm- 7am, please page neurology on call as listed in [place]. ? ?

## 2022-04-10 NOTE — Progress Notes (Signed)
Transported the patient to MRI and then back to ICU room. No issues with transport. ?

## 2022-04-10 NOTE — TOC Initial Note (Signed)
Transition of Care (TOC) - Initial/Assessment Note  ? ? ?Patient Details  ?Name: Jason Neal ?MRN: [id number] ?Date of Birth: 08/21/1951 ? ?Transition of Care (TOC) CM/SW Contact:    ?Shelbie Hutching, RN ?Phone Number: ?04/10/2022, 10:18 AM ? ?Clinical Narrative:                 ?Patient came into the hospital with cardiac arrest secondary to STEMI.  Patient is currently in the ICU intubated and sedated.  Patient is from home with his wife.  TOC will follow and assist with disposition.  Disposition to be determined.   ? ?Expected Discharge Plan:  (TBD) ?Barriers to Discharge: Continued Medical Work up ? ? ?Patient Goals and CMS Choice ?Patient states their goals for this hospitalization and ongoing recovery are:: intubated and sedated- unable to state goals ?  ?  ? ?Expected Discharge Plan and Services ?Expected Discharge Plan:  (TBD) ?  ?  ?  ?Living arrangements for the past 2 months: [place] ?                ?  ?  ?  ?  ?  ?  ?  ?  ?  ?  ? ?Prior Living Arrangements/Services ?Living arrangements for the past 2 months: [place] ?Lives with:: Spouse ?Patient language and need for interpreter reviewed:: Yes ?       ?Need for Family Participation in Patient Care: Yes (Comment) ?Care giver support system in place?: Yes (comment) ?  ?Criminal Activity/Legal Involvement Pertinent to Current Situation/Hospitalization: No - Comment as needed ? ?Activities of Daily Living ?Home Assistive Devices/Equipment: CPAP ?ADL Screening (condition at time of admission) ?Patient's cognitive ability adequate to safely complete daily activities?: Yes ?Is the patient deaf or have difficulty hearing?: No ?Does the patient have difficulty seeing, even when wearing glasses/contacts?: No ?Does the patient have difficulty concentrating, remembering, or making decisions?: No ?Patient able to express need for assistance with ADLs?: Yes ?Does the patient have difficulty dressing or bathing?: No ?Independently performs ADLs?:  Yes (appropriate for developmental age) ?Does the patient have difficulty walking or climbing stairs?: No ?Weakness of Legs: None ?Weakness of Arms/Hands: None ? ?Permission Sought/Granted ?  ?Permission granted to share information with : No ?   ?   ?   ?   ? ?Emotional Assessment ?Appearance:: Appears stated age ?Attitude/Demeanor/Rapport: Intubated (Following Commands or Not Following Commands) ?Affect (typically observed): Unable to Assess ?  ?Alcohol / Substance Use: Not Applicable ?Psych Involvement: No (comment) ? ?Admission diagnosis:  Acute respiratory failure, unspecified whether with hypoxia or hypercapnia ([place]) [J96.00] ?ST elevation myocardial infarction (STEMI), unspecified artery ([place]) [I21.3] ?Cardiopulmonary arrest with successful resuscitation [facility]) [I46.9] ?Cardiac arrest [facility]) [I46.9] ?Patient Active Problem List  ? Diagnosis Date Noted  ? Cardiopulmonary arrest with successful resuscitation ([place]) 04/07/2022  ? Acute ST elevation myocardial infarction (STEMI) of lateral wall ([place]) 04/07/2022  ? Acute respiratory failure ([place])   ? Thoracic aortic aneurysm ([place]) 07/02/2019  ? SAH (subarachnoid hemorrhage) ([place]) 08/22/2018  ? Aortic stenosis, mild 05/28/2018  ? Chronic anticoagulation 11/20/2013  ? Chronic renal insufficiency, stage III (moderate)- he sees a nephrologist in [facility] 11/20/2013  ? Type 2 diabetes mellitus with diabetic nephropathy- diet controlled 11/20/2013  ? PAF successful cardioversion  03/07/12 to SR/SB 03/04/2012  ? CAD, prior CFX/RCA DES Dec 2011, cath 03/04/12  patent stents- stable CAD 03/04/2012  ? Dyslipidemia 03/04/2012  ? Essential hypertension 03/04/2012  ? Sleep apnea, on C-Pap  03/04/2012  ? ?PCP:  Kirk Ruths, MD ?Pharmacy:   ?[place], [place] ?[address] ?[place] [address] ?Phone: [phone] Fax: [phone] ? ?[facility] [id number] [place] ?[address] ?[place] [address] ?Phone: [phone] Fax: [phone] ? ? ? ? ?Social Determinants of Health (SDOH) Interventions ?  ? ?Readmission Risk Interventions ?   ? View : No data to display.  ?  ?  ?  ? ? ? ?

## 2022-04-10 NOTE — Progress Notes (Signed)
Neurology progress note ? ?S: NAEON. Myoclonus improved after increasing keppra to '1000mg'$  bid.  Required bolus of Versed and fentanyl overnight.  Restarted fentanyl drip in addition to propofol overnight. ? ?EEG 4/28 c/w myoclonic status when propofol paused ? ?O: ? ?Vitals:  ? 04/10/22 0830 04/10/22 0900  ?BP: (!) 142/62 (!) 108/56  ?Pulse: 61 (!) 59  ?Resp: 20 20  ?Temp: 99 ?F (37.2 ?C) 99 ?F (37.2 ?C)  ?SpO2: 100% 99%  ? ?Remained on fentanyl for the exam ?Propofol held ?Started having twitching of the eyelids, mouth within seconds of stopping propofol. ?General: Intubated sedated.  Sedation held as above ?HEENT: Normocephalic atraumatic ?CVS: Regular rhythm ?Respiratory: Vented ?Abdomen nondistended nontender ?Extremities well perfused ?Neurological exam ?Intubated ?Sedation as above-propofol was held for the examination ?Bilateral eyelid twitching and facial twitching predominate in the mouth started to reappear within seconds of discontinue propofol ?Nonverbal ?Does not open eyes to command ?Does not follow commands ?Cranial nerves: Pupils are small at about 2 mm, sluggishly reactive bilaterally, positive corneals bilaterally, does not breathe over the ventilator, oculocephalics are present. Not a strong cough or gag further RN overnight but yesterday had a cough  and gag per Dr. Quinn Axe. ?Motor examination with bilateral lower extremity what appeared to be triple flexion.  No movement to the upper extremities to noxious stimulation. ?Sensation: As above ? ?Assessment:  ?71 year old man status post cardiac arrest secondary to STEMI with EEG 24 hours out from the event with myoclonic status epilepticus that improved clinically on Keppra along with sedation on propofol.  MRI was done right after admission and did not show any definitive evidence of anoxic brain injury. ?Plan for repeat MRI today given no response in bilateral upper extremity concerning for watershed infarcts. ?Clinical exam more concerning for  anoxic brain injury given the return of myoclonus as soon as sedation is stopped. ?Prognosis from a neurologically meaningful standpoint might not be great given the exam. ? ?IMP: Anoxic brain injury, myoclonus, myoclonic status epilepticus ? ?Recommendations: ?- Continue keppra '1000mg'$  q 12 hrs ?- Repeat MRI brain wo contrast- planned for sometime today ?- repeat rEEG-planned for sometime today-if evidence of ongoing seizures/status epilepticus, may need transfer to tertiary facility for continuous EEG ?- Wean sedation as able ?-Continue off sedation exams as tolerated ?Plan discussed with Dr. Patsey Berthold, patient's wife and daughter at bedside. ? ?-- ?Amie Portland, MD ?Neurologist ?[facility] ?Pager: [phone] ? ?CRITICAL CARE ATTESTATION ?Performed by: Amie Portland, MD ?Total critical care time: 45 minutes ?Critical care time was exclusive of separately billable procedures and treating other patients and/or supervising APPs/Residents/Students ?Critical care was necessary to treat or prevent imminent or life-threatening deterioration due to anoxic brain injury, myoclonus ?This patient is critically ill and at significant risk for neurological worsening and/or death and care requires constant monitoring. ?Critical care was time spent personally by me on the following activities: development of treatment plan with patient and/or surrogate as well as nursing, discussions with consultants, evaluation of patient's response to treatment, examination of patient, obtaining history from patient or surrogate, ordering and performing treatments and interventions, ordering and review of laboratory studies, ordering and review of radiographic studies, pulse oximetry, re-evaluation of patient's condition, participation in multidisciplinary rounds and medical decision making of high complexity in the care of this patient. ? ?

## 2022-04-10 NOTE — Procedures (Signed)
Patient Name: Jason Neal  ?MRN: [id number]  ?Epilepsy Attending: Lora Havens  ?Referring Physician/Provider: Derek Jack, MD ?Date: 04/10/2022 ?Duration: 26.30 mins ? ?Patient history: 71 year old man status post cardiac arrest secondary to STEMI with EEG 24 hours out from the event with myoclonic status epilepticus that improved clinically on Keppra along with sedation on propofol.  EEG to evaluate for seizure ? ?Level of alertness: comatose  ? ?AEDs during EEG study: LEV, propofol ? ?Technical aspects: This EEG study was done with scalp electrodes positioned according to the 10-20 International system of electrode placement. Electrical activity was acquired at a sampling rate of '500Hz'$  and reviewed with a high frequency filter of '70Hz'$  and a low frequency filter of '1Hz'$ . EEG data were recorded continuously and digitally stored.  ? ?Description: EEG showed generalized periodic discharges (GPDs) at  1.5-'2Hz'$ , more prominent when awake/stimulated. In between [place], eeg showed generalized eeg suppression which was reactive to stimulation. Hyperventilation and photic stimulation were not performed.    ? ?ABNORMALITY ?- Periodic discharges, generalized ( GPDs) ?- Background suppression, generalized ? ?IMPRESSION: ?This study showed evidence of epileptogenicity with generalized onset and high potential for seizure recurrence. 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 No seizures were seen throughout the recording. ? ?Lora Havens  ? ?

## 2022-04-11 ENCOUNTER — Inpatient Hospital Stay (HOSPITAL_COMMUNITY)
Admission: AD | Admit: 2022-04-11 | Discharge: 2022-05-11 | DRG: 091 | Disposition: E | Payer: Medicare Other | Source: Other Acute Inpatient Hospital | Attending: Family Medicine | Admitting: Family Medicine

## 2022-04-11 DIAGNOSIS — N179 Acute kidney failure, unspecified: Secondary | ICD-10-CM | POA: Diagnosis present

## 2022-04-11 DIAGNOSIS — J690 Pneumonitis due to inhalation of food and vomit: Secondary | ICD-10-CM | POA: Diagnosis present

## 2022-04-11 DIAGNOSIS — Z8674 Personal history of sudden cardiac arrest: Secondary | ICD-10-CM

## 2022-04-11 DIAGNOSIS — N183 Chronic kidney disease, stage 3 unspecified: Secondary | ICD-10-CM | POA: Diagnosis present

## 2022-04-11 DIAGNOSIS — G40901 Epilepsy, unspecified, not intractable, with status epilepticus: Secondary | ICD-10-CM | POA: Diagnosis present

## 2022-04-11 DIAGNOSIS — G931 Anoxic brain damage, not elsewhere classified: Principal | ICD-10-CM

## 2022-04-11 DIAGNOSIS — D6489 Other specified anemias: Secondary | ICD-10-CM | POA: Diagnosis present

## 2022-04-11 DIAGNOSIS — N2889 Other specified disorders of kidney and ureter: Secondary | ICD-10-CM | POA: Diagnosis present

## 2022-04-11 DIAGNOSIS — E1165 Type 2 diabetes mellitus with hyperglycemia: Secondary | ICD-10-CM | POA: Diagnosis present

## 2022-04-11 DIAGNOSIS — I469 Cardiac arrest, cause unspecified: Secondary | ICD-10-CM | POA: Diagnosis present

## 2022-04-11 DIAGNOSIS — Z888 Allergy status to other drugs, medicaments and biological substances status: Secondary | ICD-10-CM

## 2022-04-11 DIAGNOSIS — I129 Hypertensive chronic kidney disease with stage 1 through stage 4 chronic kidney disease, or unspecified chronic kidney disease: Secondary | ICD-10-CM | POA: Diagnosis present

## 2022-04-11 DIAGNOSIS — N1411 Contrast-induced nephropathy: Secondary | ICD-10-CM | POA: Diagnosis present

## 2022-04-11 DIAGNOSIS — I480 Paroxysmal atrial fibrillation: Secondary | ICD-10-CM

## 2022-04-11 DIAGNOSIS — R7401 Elevation of levels of liver transaminase levels: Secondary | ICD-10-CM | POA: Diagnosis present

## 2022-04-11 DIAGNOSIS — R739 Hyperglycemia, unspecified: Secondary | ICD-10-CM | POA: Diagnosis not present

## 2022-04-11 DIAGNOSIS — J9601 Acute respiratory failure with hypoxia: Secondary | ICD-10-CM | POA: Diagnosis present

## 2022-04-11 DIAGNOSIS — Z955 Presence of coronary angioplasty implant and graft: Secondary | ICD-10-CM

## 2022-04-11 DIAGNOSIS — Z66 Do not resuscitate: Secondary | ICD-10-CM | POA: Diagnosis not present

## 2022-04-11 DIAGNOSIS — G4733 Obstructive sleep apnea (adult) (pediatric): Secondary | ICD-10-CM

## 2022-04-11 DIAGNOSIS — Z7984 Long term (current) use of oral hypoglycemic drugs: Secondary | ICD-10-CM

## 2022-04-11 DIAGNOSIS — E1122 Type 2 diabetes mellitus with diabetic chronic kidney disease: Secondary | ICD-10-CM | POA: Diagnosis present

## 2022-04-11 DIAGNOSIS — G40409 Other generalized epilepsy and epileptic syndromes, not intractable, without status epilepticus: Secondary | ICD-10-CM

## 2022-04-11 DIAGNOSIS — T508X5A Adverse effect of diagnostic agents, initial encounter: Secondary | ICD-10-CM | POA: Diagnosis present

## 2022-04-11 DIAGNOSIS — I63541 Cerebral infarction due to unspecified occlusion or stenosis of right cerebellar artery: Secondary | ICD-10-CM | POA: Diagnosis present

## 2022-04-11 DIAGNOSIS — I213 ST elevation (STEMI) myocardial infarction of unspecified site: Secondary | ICD-10-CM

## 2022-04-11 DIAGNOSIS — J150 Pneumonia due to Klebsiella pneumoniae: Secondary | ICD-10-CM | POA: Diagnosis present

## 2022-04-11 DIAGNOSIS — G9340 Encephalopathy, unspecified: Secondary | ICD-10-CM | POA: Diagnosis not present

## 2022-04-11 DIAGNOSIS — Z9911 Dependence on respirator [ventilator] status: Secondary | ICD-10-CM | POA: Diagnosis not present

## 2022-04-11 DIAGNOSIS — R569 Unspecified convulsions: Secondary | ICD-10-CM | POA: Diagnosis not present

## 2022-04-11 DIAGNOSIS — Z79899 Other long term (current) drug therapy: Secondary | ICD-10-CM

## 2022-04-11 DIAGNOSIS — Z515 Encounter for palliative care: Principal | ICD-10-CM

## 2022-04-11 DIAGNOSIS — I959 Hypotension, unspecified: Secondary | ICD-10-CM | POA: Diagnosis present

## 2022-04-11 DIAGNOSIS — I2129 ST elevation (STEMI) myocardial infarction involving other sites: Secondary | ICD-10-CM | POA: Diagnosis present

## 2022-04-11 DIAGNOSIS — I2121 ST elevation (STEMI) myocardial infarction involving left circumflex coronary artery: Secondary | ICD-10-CM

## 2022-04-11 DIAGNOSIS — Z79890 Hormone replacement therapy (postmenopausal): Secondary | ICD-10-CM

## 2022-04-11 DIAGNOSIS — E876 Hypokalemia: Secondary | ICD-10-CM | POA: Diagnosis present

## 2022-04-11 DIAGNOSIS — Z7901 Long term (current) use of anticoagulants: Secondary | ICD-10-CM

## 2022-04-11 DIAGNOSIS — I2510 Atherosclerotic heart disease of native coronary artery without angina pectoris: Secondary | ICD-10-CM

## 2022-04-11 LAB — CULTURE, RESPIRATORY W GRAM STAIN: Culture: NORMAL

## 2022-04-11 LAB — BASIC METABOLIC PANEL
Anion gap: 10 (ref 5–15)
BUN: 33 mg/dL — ABNORMAL HIGH (ref 8–23)
CO2: 22 mmol/L (ref 22–32)
Calcium: 7.4 mg/dL — ABNORMAL LOW (ref 8.9–10.3)
Chloride: 112 mmol/L — ABNORMAL HIGH (ref 98–111)
Creatinine, Ser: 1.42 mg/dL — ABNORMAL HIGH (ref 0.61–1.24)
GFR, Estimated: 53 mL/min — ABNORMAL LOW (ref >60)
Glucose, Bld: 186 mg/dL — ABNORMAL HIGH (ref 70–99)
Potassium: 3.4 mmol/L — ABNORMAL LOW (ref 3.5–5.1)
Sodium: 144 mmol/L (ref 135–145)

## 2022-04-11 LAB — TRIGLYCERIDES: Triglycerides: 335 mg/dL — ABNORMAL HIGH (ref <150)

## 2022-04-11 LAB — CBC WITH DIFFERENTIAL/PLATELET
Abs Immature Granulocytes: 0.03 10*3/uL (ref 0.00–0.07)
Basophils Absolute: 0 10*3/uL (ref 0.0–0.1)
Basophils Relative: 0 %
Eosinophils Absolute: 0.4 10*3/uL (ref 0.0–0.5)
Eosinophils Relative: 6 %
HCT: 27.2 % — ABNORMAL LOW (ref 39.0–52.0)
Hemoglobin: 8.1 g/dL — ABNORMAL LOW (ref 13.0–17.0)
Immature Granulocytes: 1 %
Lymphocytes Relative: 21 %
Lymphs Abs: 1.3 10*3/uL (ref 0.7–4.0)
MCH: 26.6 pg (ref 26.0–34.0)
MCHC: 29.8 g/dL — ABNORMAL LOW (ref 30.0–36.0)
MCV: 89.5 fL (ref 80.0–100.0)
Monocytes Absolute: 0.5 10*3/uL (ref 0.1–1.0)
Monocytes Relative: 8 %
Neutro Abs: 3.9 10*3/uL (ref 1.7–7.7)
Neutrophils Relative %: 64 %
Platelets: 178 10*3/uL (ref 150–400)
RBC: 3.04 MIL/uL — ABNORMAL LOW (ref 4.22–5.81)
RDW: 16.2 % — ABNORMAL HIGH (ref 11.5–15.5)
WBC: 6.2 10*3/uL (ref 4.0–10.5)
nRBC: 0 % (ref 0.0–0.2)

## 2022-04-11 LAB — GLUCOSE, CAPILLARY
Glucose-Capillary: 171 mg/dL — ABNORMAL HIGH (ref 70–99)
Glucose-Capillary: 143 mg/dL — ABNORMAL HIGH (ref 70–99)
Glucose-Capillary: 203 mg/dL — ABNORMAL HIGH (ref 70–99)
Glucose-Capillary: 172 mg/dL — ABNORMAL HIGH (ref 70–99)
Glucose-Capillary: 184 mg/dL — ABNORMAL HIGH (ref 70–99)
Glucose-Capillary: 188 mg/dL — ABNORMAL HIGH (ref 70–99)

## 2022-04-11 LAB — MAGNESIUM: Magnesium: 2.1 mg/dL (ref 1.7–2.4)

## 2022-04-11 LAB — PHOSPHORUS: Phosphorus: 3.4 mg/dL (ref 2.5–4.6)

## 2022-04-11 MED ORDER — FREE WATER
30.0000 mL | Status: AC
Start: 2022-04-11 — End: ?

## 2022-04-11 MED ORDER — POLYETHYLENE GLYCOL 3350 17 G PO PACK: 17.0000 g | PACK | Freq: Every day | ORAL | Status: DC

## 2022-04-11 MED ORDER — CHLORHEXIDINE GLUCONATE CLOTH 2 % EX PADS
6.0000 | MEDICATED_PAD | Freq: Every day | CUTANEOUS | Status: DC
  Administered 2022-04-11 – 2022-04-14 (×3): 6 via TOPICAL

## 2022-04-11 MED ORDER — MIDAZOLAM HCL 2 MG/2ML IJ SOLN: 2.0000 mg | INTRAMUSCULAR | 0 refills | Status: AC | PRN

## 2022-04-11 MED ORDER — SODIUM CHLORIDE 0.9 % IV SOLN: 250.0000 mL | INTRAVENOUS | 0 refills | Status: AC | PRN

## 2022-04-11 MED ORDER — FENTANYL CITRATE PF 50 MCG/ML IJ SOSY: 25.0000 ug | PREFILLED_SYRINGE | Freq: Once | INTRAMUSCULAR | Status: AC

## 2022-04-11 MED ORDER — POTASSIUM CHLORIDE 20 MEQ PO PACK: 40.0000 meq | PACK | Freq: Two times a day (BID) | ORAL | Status: AC

## 2022-04-11 MED ORDER — CLOPIDOGREL BISULFATE 75 MG PO TABS
75.0000 mg | ORAL_TABLET | Freq: Every day | ORAL | Status: DC
  Administered 2022-04-12 – 2022-04-13 (×2): 75 mg
  Filled 2022-04-11 (×2): qty 1

## 2022-04-11 MED ORDER — NITROGLYCERIN 0.4 MG SL SUBL: 0.4000 mg | SUBLINGUAL_TABLET | SUBLINGUAL | 12 refills | Status: AC | PRN

## 2022-04-11 MED ORDER — CLOPIDOGREL BISULFATE 75 MG PO TABS: 75.0000 mg | ORAL_TABLET | Freq: Every day | ORAL | Status: AC

## 2022-04-11 MED ORDER — SODIUM CHLORIDE 0.9% FLUSH: 3.0000 mL | INTRAVENOUS | Status: AC | PRN

## 2022-04-11 MED ORDER — PROPOFOL 1000 MG/100ML IV EMUL: 5.0000 ug/kg/min | INTRAVENOUS | Status: AC

## 2022-04-11 MED ORDER — PANTOPRAZOLE SODIUM 40 MG IV SOLR: 40.0000 mg | Freq: Every day | INTRAVENOUS | Status: DC

## 2022-04-11 MED ORDER — OXYCODONE HCL 5 MG PO TABS: 5.0000 mg | ORAL_TABLET | Freq: Four times a day (QID) | ORAL | 0 refills | Status: AC

## 2022-04-11 MED ORDER — FENTANYL CITRATE PF 50 MCG/ML IJ SOSY: 50.0000 ug | PREFILLED_SYRINGE | INTRAMUSCULAR | 0 refills | Status: AC | PRN

## 2022-04-11 MED ORDER — PROPOFOL 1000 MG/100ML IV EMUL
0.0000 ug/kg/min | INTRAVENOUS | Status: DC
  Administered 2022-04-12 (×2): 50 ug/kg/min via INTRAVENOUS
  Administered 2022-04-12: 30 ug/kg/min via INTRAVENOUS
  Administered 2022-04-12 (×2): 40 ug/kg/min via INTRAVENOUS
  Administered 2022-04-13 (×9): 60 ug/kg/min via INTRAVENOUS
  Filled 2022-04-11: qty 200
  Filled 2022-04-11: qty 100
  Filled 2022-04-11: qty 200
  Filled 2022-04-11 (×2): qty 100
  Filled 2022-04-11: qty 200
  Filled 2022-04-11 (×7): qty 100

## 2022-04-11 MED ORDER — INSULIN ASPART 100 UNIT/ML IJ SOLN: 0.0000 [IU] | INTRAMUSCULAR | 11 refills | Status: AC

## 2022-04-11 MED ORDER — ASPIRIN 81 MG PO CHEW: 81.0000 mg | CHEWABLE_TABLET | Freq: Every day | ORAL | Status: AC

## 2022-04-11 MED ORDER — ATORVASTATIN CALCIUM 80 MG PO TABS
80.0000 mg | ORAL_TABLET | Freq: Every day | ORAL | Status: DC
Start: 2022-04-12 — End: 2022-04-14
  Administered 2022-04-12 – 2022-04-13 (×2): 80 mg
  Filled 2022-04-11 (×2): qty 1

## 2022-04-11 MED ORDER — B COMPLEX-C PO TABS: 1.0000 | ORAL_TABLET | ORAL | Status: AC

## 2022-04-11 MED ORDER — DOCUSATE SODIUM 50 MG/5ML PO LIQD: 100.0000 mg | Freq: Two times a day (BID) | ORAL | 0 refills | Status: AC

## 2022-04-11 MED ORDER — SODIUM CHLORIDE 0.9 % IV SOLN: 10.0000 mL | INTRAVENOUS | 0 refills | Status: AC

## 2022-04-11 MED ORDER — ALBUTEROL SULFATE (2.5 MG/3ML) 0.083% IN NEBU
2.5000 mg | INHALATION_SOLUTION | RESPIRATORY_TRACT | Status: DC | PRN
Start: 2022-04-11 — End: 2022-04-16
  Administered 2022-04-12: 2.5 mg via RESPIRATORY_TRACT
  Filled 2022-04-11 (×2): qty 3

## 2022-04-11 MED ORDER — CHLORHEXIDINE GLUCONATE CLOTH 2 % EX PADS: 6.0000 | MEDICATED_PAD | Freq: Every day | CUTANEOUS | Status: AC

## 2022-04-11 MED ORDER — INSULIN ASPART 100 UNIT/ML IJ SOLN
0.0000 [IU] | INTRAMUSCULAR | Status: DC
  Administered 2022-04-12: 5 [IU] via SUBCUTANEOUS
  Administered 2022-04-12 (×2): 3 [IU] via SUBCUTANEOUS
  Administered 2022-04-12: 8 [IU] via SUBCUTANEOUS
  Administered 2022-04-12: 11 [IU] via SUBCUTANEOUS
  Administered 2022-04-12: 3 [IU] via SUBCUTANEOUS

## 2022-04-11 MED ORDER — POTASSIUM CHLORIDE 20 MEQ PO PACK
40.0000 meq | PACK | Freq: Two times a day (BID) | ORAL | Status: DC
Start: 2022-04-11 — End: 2022-04-11
  Administered 2022-04-11: 40 meq
  Filled 2022-04-11: qty 2

## 2022-04-11 MED ORDER — ONDANSETRON HCL 4 MG/2ML IJ SOLN: 4.0000 mg | Freq: Four times a day (QID) | INTRAMUSCULAR | 0 refills | Status: AC | PRN

## 2022-04-11 MED ORDER — ASPIRIN 81 MG PO CHEW
81.0000 mg | CHEWABLE_TABLET | Freq: Every day | ORAL | Status: DC
  Administered 2022-04-12 – 2022-04-13 (×2): 81 mg
  Filled 2022-04-11 (×2): qty 1

## 2022-04-11 MED ORDER — DOCUSATE SODIUM 100 MG PO CAPS: 100.0000 mg | ORAL_CAPSULE | Freq: Two times a day (BID) | ORAL | Status: DC | PRN

## 2022-04-11 MED ORDER — ORAL CARE MOUTH RINSE
15.0000 mL | OROMUCOSAL | Status: DC
  Administered 2022-04-12 – 2022-04-13 (×19): 15 mL via OROMUCOSAL

## 2022-04-11 MED ORDER — HEPARIN SODIUM (PORCINE) 5000 UNIT/ML IJ SOLN
5000.0000 [IU] | Freq: Three times a day (TID) | INTRAMUSCULAR | Status: DC
  Administered 2022-04-11 – 2022-04-13 (×6): 5000 [IU] via SUBCUTANEOUS
  Filled 2022-04-11 (×6): qty 1

## 2022-04-11 MED ORDER — LEVETIRACETAM IN NACL 1000 MG/100ML IV SOLN: 1000.0000 mg | Freq: Two times a day (BID) | INTRAVENOUS | Status: AC

## 2022-04-11 MED ORDER — CHLORHEXIDINE GLUCONATE 0.12% ORAL RINSE (MEDLINE KIT): 15.0000 mL | Freq: Two times a day (BID) | OROMUCOSAL | 0 refills | Status: AC

## 2022-04-11 MED ORDER — VITAL AF 1.2 CAL PO LIQD: 1000.0000 mL | ORAL | Status: AC

## 2022-04-11 MED ORDER — FENTANYL BOLUS VIA INFUSION
25.0000 ug | INTRAVENOUS | Status: DC | PRN
  Administered 2022-04-12: 50 ug via INTRAVENOUS
  Filled 2022-04-11: qty 100

## 2022-04-11 MED ORDER — PANTOPRAZOLE 2 MG/ML SUSPENSION
40.0000 mg | Freq: Every day | ORAL | Status: DC
  Administered 2022-04-12 – 2022-04-13 (×2): 40 mg
  Filled 2022-04-11 (×2): qty 20

## 2022-04-11 MED ORDER — POLYVINYL ALCOHOL 1.4 % OP SOLN: 1.0000 [drp] | OPHTHALMIC | 0 refills | Status: AC | PRN

## 2022-04-11 MED ORDER — SODIUM CHLORIDE 0.9% FLUSH: 3.0000 mL | Freq: Two times a day (BID) | INTRAVENOUS | Status: AC

## 2022-04-11 MED ORDER — ORAL CARE MOUTH RINSE: 15.0000 mL | OROMUCOSAL | 0 refills | Status: AC

## 2022-04-11 MED ORDER — PROSOURCE TF PO LIQD: 45.0000 mL | Freq: Three times a day (TID) | ORAL | Status: AC

## 2022-04-11 MED ORDER — FENTANYL 2500MCG IN NS 250ML (10MCG/ML) PREMIX INFUSION: 0.0000 ug/h | INTRAVENOUS | 0 refills | Status: AC

## 2022-04-11 MED ORDER — CHLORHEXIDINE GLUCONATE 0.12% ORAL RINSE (MEDLINE KIT)
15.0000 mL | Freq: Two times a day (BID) | OROMUCOSAL | Status: DC
  Administered 2022-04-11 – 2022-04-14 (×5): 15 mL via OROMUCOSAL

## 2022-04-11 MED ORDER — SODIUM CHLORIDE 0.9 % IV SOLN: 3.0000 g | Freq: Four times a day (QID) | INTRAVENOUS | Status: AC

## 2022-04-11 MED ORDER — ATORVASTATIN CALCIUM 80 MG PO TABS: 80.0000 mg | ORAL_TABLET | Freq: Every day | ORAL | Status: AC

## 2022-04-11 MED ORDER — IPRATROPIUM BROMIDE 0.06 % NA SOLN: 2.0000 | Freq: Three times a day (TID) | NASAL | 12 refills | Status: AC

## 2022-04-11 MED ORDER — PANTOPRAZOLE SODIUM 40 MG PO PACK: 40.0000 mg | PACK | Freq: Every day | ORAL | Status: AC

## 2022-04-11 MED ORDER — DOCUSATE SODIUM 50 MG/5ML PO LIQD
100.0000 mg | Freq: Two times a day (BID) | ORAL | Status: DC
  Administered 2022-04-11 – 2022-04-13 (×4): 100 mg
  Filled 2022-04-11 (×4): qty 10

## 2022-04-11 MED ORDER — FENTANYL 2500MCG IN NS 250ML (10MCG/ML) PREMIX INFUSION
25.0000 ug/h | INTRAVENOUS | Status: DC
  Administered 2022-04-12: 150 ug/h via INTRAVENOUS
  Filled 2022-04-11: qty 250

## 2022-04-11 MED ORDER — ACETAMINOPHEN 325 MG PO TABS
650.0000 mg | ORAL_TABLET | ORAL | Status: AC | PRN
Start: 2022-04-11 — End: ?

## 2022-04-11 MED ORDER — POLYETHYLENE GLYCOL 3350 17 G PO PACK: 17.0000 g | PACK | Freq: Every day | ORAL | Status: DC | PRN

## 2022-04-11 MED ORDER — LEVETIRACETAM IN NACL 1000 MG/100ML IV SOLN
1000.0000 mg | Freq: Two times a day (BID) | INTRAVENOUS | Status: DC
  Administered 2022-04-11 – 2022-04-16 (×10): 1000 mg via INTRAVENOUS
  Filled 2022-04-11 (×10): qty 100

## 2022-04-11 MED ORDER — ALBUTEROL SULFATE (2.5 MG/3ML) 0.083% IN NEBU: 2.5000 mg | INHALATION_SOLUTION | Freq: Four times a day (QID) | RESPIRATORY_TRACT | 12 refills | Status: AC | PRN

## 2022-04-11 MED ORDER — CHLORHEXIDINE GLUCONATE 0.12 % MT SOLN
OROMUCOSAL | Status: AC
  Filled 2022-04-11: qty 15

## 2022-04-11 MED ORDER — POLYETHYLENE GLYCOL 3350 17 G PO PACK: 17.0000 g | PACK | Freq: Every day | ORAL | 0 refills | Status: AC

## 2022-04-11 NOTE — H&P (Signed)
? ?NAME:  Jason Neal, MRN:  [id number], DOB:  1951-07-24, LOS: 0 ?ADMISSION DATE:  04/14/2022, CONSULTATION DATE: 04/18/2022 ?REFERRING MD: [facility] Dr. Patsey Berthold, CHIEF COMPLAINT: Concern for anoxic brain injury status postcardiac arrest on 04/07/2022 ? ?History of Present Illness:  ?71 year old male who has a history of coronary artery disease with a prior PCI to left circumflex and RCA he also has a past medical history of paroxysmal atrial fibrillation, diabetes mellitus, hypertension chronic kidney disease OSA on CPAP.  He presented to [facility] 4/28 with cardiac arrest was found to have a lateral STEMI for which he was taken to the cath lab and had stent placement. Since admit, MRI shows changes consistent with anoxic brain damage and after discussion with neurology, they recommended transferred to [facility] intensive care unit 5/2 for cEEG and further evaluation by neurological services. ? ?Pertinent  Medical History  ? ?Past Medical History:  ?Diagnosis Date  ? Allergies   ? HAS ALBUTEROL INHALER  ? Aortic valve stenosis   ? mild  ? Brain bleed ([place]) 08/2018  ? DUE TO FALL AND HIT HEAD-BLEED RESOLVED ON ITS OWN  ? CAD (coronary artery disease)   ? prior CFX/RCA PCI 2011, cath [facility] 3/13  ? Cancer [facility]) 2017  ? MELANOMA RIGHT EAR  ? Carotid bruit 03/14/11  ? CAROTID DUPLEX DOPPLER-Right bulb; demonstrated a mild amount of fibrous plaque w/o evidence of a significant diameter reduction, dissection or any other vascular abnormality.;Left ICA;-proximal, mid and distal segments of the internal carotid artery normal patency.mid and distal segments demonstrating moderate tortuosity..Mildly abnormal carotid duplex scan.  ? Chronic renal insufficiency, stage III (moderate) (HCC)   ? DJD (degenerative joint disease)   ? c-spine  ? Dyslipidemia   ? Dysrhythmia   ? GERD (gastroesophageal reflux disease)   ? Heart murmur   ? History of kidney stones   ? X1  ? Hypercholesteremia   ? Hypertension   ? PAF  (paroxysmal atrial fibrillation) [facility]) March 2013  ? Sinus headache   ? Sleep apnea   ? on C-Pap  ? Type 2 diabetes mellitus with chronic kidney disease ([place])   ? diet controlled  ? ? ? ?Significant Hospital Events: ?Including procedures, antibiotic start and stop dates in addition to other pertinent events   ?4/28: Admitted to the ICU with acute ST elevation MI of the lateral wall s/p cath ?4/30: WUA performed myoclonus improved pt unable to follow commands; no response to BUE noxious stimuli; withdraws from pain BLE; Sedation restarted due to vent dyssynchrony, increased work of breathing and agitation/delirium.  Attempted to transition to precedex gtt and prn fentanyl with scheduled oxycodone.  However, pt did not tolerate propofol and fentanyl gtts restarted                ?5/1: MRI Brain and EEG concerning for anoxic injury ?5/2: Persistent rhythmic fasciculations of right mouth/lip concerning for myoclonus vs seizures.  Neurology recommends transfer to [facility].   ? ?Interim History / Subjective:  ?Vitals stable. ?Comfortable on Propofol + Fentanyl. ? ?Objective   ?There were no vitals taken for this visit. ?   ?Vent Mode: PRVC ?FiO2 (%):  [30 %-36 %] 30 % ?Set Rate:  [20 bmp] 20 bmp ?Vt Set:  [520 mL] 520 mL ?PEEP:  [5 cmH20] 5 cmH20 ?Plateau Pressure:  [18 cmH20-20 cmH20] 20 cmH20  ?No intake or output data in the 24 hours ending 04/23/2022 2140 ? ?There were no vitals filed for this  visit. ? ? ?Examination: ?General: Adult male, critically ill, in NAD. ?Neuro: Sedated, not responsive. ?HEENT: [place]/AT. Sclerae anicteric. ETT in place. ?Cardiovascular: RRR, no M/R/G.  ?Lungs: Respirations even and unlabored.  CTA bilaterally, No W/R/R. ?Abdomen: BS x 4, soft, NT/ND.  ?Musculoskeletal: No gross deformities, 1+ edema.  ?Skin: Intact, warm, no rashes. ? ? ?Significant Diagnostic Studies:  ?4/28: Cardiac Cath>>Prox Cx to Mid Cx lesion is 90% stenosed ?Proximal to previously placed stent A drug-eluting stent  was ?successfully placed (overlapping previously placed stent) using a STENT ONYX FRONTIER 2.25X26.  Postdilated to 2.6 mm Post intervention, there is a 0% residual stenosis. Previously placed Mid Cx to Dist Cx stent (drug-eluting stent) is  widely patent. Mid LAD lesion is 35% stenosed. 1st Diag lesion is 55% stenosed.  2nd Diag lesion is 70% stenosed. Previously placed Prox RCA stent (drug- ?eluting stent) is  widely patent. Mid RCA to Dist RCA lesion is 35% stenosed. ?4/28: MRI Brain>>Punctate acute/subacute infarct in the right cerebellar hemisphere. Trace intraventricular hemorrhage in the occipital horn of the right lateral ventricle. No evidence of global anoxic injury. Repeat study within 2-4 days may be obtained to exclude delayed injury. ?4/29: Echo>>EF estimated 60 to 65%  ?4/29: EEG>>Initial burst suppression pattern consistent with global cerebral dysfunction, medication effect, or both. After propofol was turned off, patient developed generalized periodic discharges at 1 Hz over a background of diffuse suppression timelocked to full body myoclonus, consistent with myoclonic status epilepticus. ? ?Assessment / Plan:  ? 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   ?Concern for aspiration - s/p course abx completed 5/2 (Unasyn). ?Hx OSA. ?- Continue full ventilatory support. ?- Wean as mental status allows. ?- Bronchodilators as needed. ?- Follow CXR. ? ?Altered mental status in the setting of cardiac arrest with suspected anoxic brain injury per exam and MRI. ?Trace IV hemorrhage in right occipital horn. ?Evolving small right cerebellar infarct. ?- Neuro notified of transfer here. ?- Continuous EEG per neuro. ?- Continue Keppra '1000mg'$  q12hrs. ? ?PEA arrest with lateral STEMI - s/p LHC with stent placement to LCx 4/28. ?Paroxysmal atrial fibrillation ?- Day team to please notify Cardiology of transer. ?- Continue Plavix, ASA,  Atorvastatin. ?- Hold home antihypertensives for now. ? ?Acute renal insufficiency - exacerbated by contrast from [facility]. ?- Supportive care with gentle fluids. ?- Follow BMP. ? ?Anemia. ?- Follow H/H. ?- Continue th hold Xarelto.  ? ?Diabetes mellitus type 2. ?- SSI. ? ?Best Practice (right click and "Reselect all SmartList Selections" daily)  ? ?Diet/type: tubefeeds ?DVT prophylaxis: prophylactic heparin  ?GI prophylaxis: PPI ?Lines: Central line ?Foley:  Yes, and it is still needed ?Code Status:  full code ?Last date of multidisciplinary goals of care discussion [tbd] ? ?Labs   ?CBC: ?Recent Labs  ?Lab 04/06/22 ?2359 04/07/22 ?0423 04/07/22 ?1604 04/08/22 ?5809 04/09/22 ?9833 04/09/22 ?1830 04/10/22 ?8250 05/03/2022 ?0429  ?WBC 12.7* 11.0*  --  11.0* 9.1  --  8.2 6.2  ?NEUTROABS 6.5  --   --   --  6.9  --  5.7 3.9  ?HGB 12.1* 10.8*   < > 9.2* 7.7* 8.7* 8.4* 8.1*  ?HCT 38.8* 34.0*   < > 28.9* 24.2* 27.2* 26.2* 27.2*  ?MCV 87.4 84.2  --  85.5 85.8  --  85.9 89.5  ?PLT 197 218  --  209 150  --  172 178  ? < > = values in this interval not displayed.  ? ? ? ?  Basic Metabolic Panel: ?Recent Labs  ?Lab 04/07/22 ?1102 04/08/22 ?0434 04/09/22 ?0431 04/09/22 ?1830 04/10/22 ?4982 04/10/22 ?1621 05/06/2022 ?0429  ?NA 141 138 140  --  144  --  144  ?K 4.4 3.8 3.7  --  3.4*  --  3.4*  ?CL 108 108 112*  --  114*  --  112*  ?CO2 23 21* 23  --  23  --  22  ?GLUCOSE 209* 193* 184*  --  170*  --  186*  ?BUN 37* 38* 32*  --  28*  --  33*  ?CREATININE 2.25* 2.32* 1.83*  --  1.53*  --  1.42*  ?CALCIUM 8.1* 7.9* 7.7*  --  7.5*  --  7.4*  ?MG 1.4* 2.2 2.1 2.0 2.2 2.3 2.1  ?PHOS 3.9 3.7 2.4* 2.9 2.4* 3.1 3.4  ? ? ?GFR: ?Estimated Creatinine Clearance: 63.2 mL/min (A) (by C-G formula based on SCr of 1.42 mg/dL (H)). ?Recent Labs  ?Lab 04/07/22 ?1100 04/08/22 ?0434 04/09/22 ?0431 04/10/22 ?6415 04/24/2022 ?0429  ?WBC  --  11.0* 9.1 8.2 6.2  ?LATICACIDVEN 2.2*  --   --   --   --   ? ? ? ?Liver Function Tests: ?Recent Labs  ?Lab 04/06/22 ?2359  04/07/22 ?1102 04/08/22 ?0434 04/09/22 ?0431 04/10/22 ?0337  ?AST 218* 362* 225* 115* 80*  ?ALT 184* 189* 137* 85* 57*  ?ALKPHOS 50 41 35* 30* 28*  ?BILITOT 0.4 0.5 0.5 0.7 0.6  ?PROT 6.8 6.2* 6.3* 6.1* 5.9*  ?ALBUMIN 3.5 3.3* 3.2* 2.

## 2022-04-11 NOTE — Progress Notes (Signed)
? ? ?Progress Note ? ?Patient Name: Jason Neal ?Date of Encounter: 04/19/2022 ? ?Primary Cardiologist: Gwenlyn Found ? ?Subjective  ? ?Intubated and sedated. MRI brain and EEG yesterday concerning for hypoxic/ischemic brain injury. Wife, sister, and daughter at bedside.  ? ?Inpatient Medications  ?  ?Scheduled Meds: ? aspirin  81 mg Per Tube Daily  ? atorvastatin  80 mg Per Tube Daily  ? B-complex with vitamin C  1 tablet Per Tube QODAY  ? chlorhexidine gluconate (MEDLINE KIT)  15 mL Mouth Rinse BID  ? Chlorhexidine Gluconate Cloth  6 each Topical Daily  ? clopidogrel  75 mg Per Tube Q breakfast  ? docusate  100 mg Per Tube BID  ? feeding supplement (PROSource TF)  45 mL Per Tube TID  ? free water  30 mL Per Tube Q4H  ? insulin aspart  0-9 Units Subcutaneous Q4H  ? ipratropium  2 spray Each Nare TID  ? mouth rinse  15 mL Mouth Rinse 10 times per day  ? oxyCODONE  5 mg Per Tube Q6H  ? pantoprazole sodium  40 mg Per Tube Daily  ? polyethylene glycol  17 g Per Tube Daily  ? potassium chloride  40 mEq Per Tube Q12H  ? sodium chloride  2 spray Each Nare BID  ? sodium chloride flush  3 mL Intravenous Q12H  ? ?Continuous Infusions: ? sodium chloride 10 mL/hr at 04/07/22 2000  ? sodium chloride    ? ampicillin-sulbactam (UNASYN) IV Stopped (04/15/2022 0407)  ? feeding supplement (VITAL AF 1.2 CAL) 45 mL/hr at 05/06/2022 0056  ? fentaNYL infusion INTRAVENOUS 125 mcg/hr (04/26/2022 0500)  ? levETIRAcetam Stopped (04/10/22 2353)  ? propofol (DIPRIVAN) infusion 30 mcg/kg/min (05/08/2022 1031)  ? ?PRN Meds: ?sodium chloride, acetaminophen, albuterol, fentaNYL (SUBLIMAZE) injection, fluticasone, midazolam, nitroGLYCERIN, ondansetron (ZOFRAN) IV, polyvinyl alcohol, sodium chloride flush  ? ?Vital Signs  ?  ?Vitals:  ? 05/10/2022 0300 05/02/2022 0400 04/19/2022 0500 04/26/2022 0600  ?BP: (!) 101/48 (!) 110/47 (!) 102/44 (!) 105/43  ?Pulse: (!) 55 (!) 57 (!) 55 (!) 55  ?Resp: _0 ?Temp:  98.6 ?F (37 ?C)    ?TempSrc:      ?SpO2: 100% 100% 100%  100%  ?Weight:   110.8 kg   ?Height:      ? ? ?Intake/Output Summary (Last 24 hours) at 04/12/2022 0814 ?Last data filed at 04/21/2022 0500 ?Gross per 24 hour  ?Intake 2827 ml  ?Output 1210 ml  ?Net 1617 ml  ? ?Filed Weights  ? 04/06/22 2357 04/10/22 0500 04/24/2022 0500  ?Weight: 116.5 kg 110.5 kg 110.8 kg  ? ? ?Telemetry  ?  ?Sinus bradycardia and sinus rhythm with rates in the 50s to 70s bpm - Personally Reviewed ? ?ECG  ?  ?No new tracings - Personally Reviewed ? ?Physical Exam  ? ?GEN: Intubated, sedated, and ill appearing.   ?Neck: JVD difficult to assess secondary to respiratory support apparatus. ?Cardiac: RRR, no murmurs, rubs, or gallops.  ?Respiratory: Vented breath sounds bilaterally.  ?GI: Soft, nontender, non-distended.   ?MS: No edema; No deformity. ?Neuro:  Intubated and sedated.  ?Psych: Intubated and sedated. ? ?Labs  ?  ?Chemistry ?Recent Labs  ?Lab 04/08/22 ?5945 04/09/22 ?0431 04/10/22 ?8592 04/30/2022 ?0429  ?NA 138 140 144 144  ?K 3.8 3.7 3.4* 3.4*  ?CL 108 112* 114* 112*  ?CO2 21* _1 ?GLUCOSE 193* 184* 170* 186*  ?BUN 38* 32* 28* 33*  ?CREATININE 2.32* 1.83* 1.53*  1.42*  ?CALCIUM 7.9* 7.7* 7.5* 7.4*  ?PROT 6.3* 6.1* 5.9*  --   ?ALBUMIN 3.2* 2.9* 2.6*  --   ?AST 225* 115* 80*  --   ?ALT 137* 85* 57*  --   ?ALKPHOS 35* 30* 28*  --   ?BILITOT 0.5 0.7 0.6  --   ?GFRNONAA 29* 39* 48* 53*  ?ANIONGAP _0 ?  ? ?Hematology ?Recent Labs  ?Lab 04/09/22 ?0431 04/09/22 ?1830 04/10/22 ?4920 04/15/2022 ?0429  ?WBC 9.1  --  8.2 6.2  ?RBC 2.82*  --  3.05* 3.04*  ?HGB 7.7* 8.7* 8.4* 8.1*  ?HCT 24.2* 27.2* 26.2* 27.2*  ?MCV 85.8  --  85.9 89.5  ?MCH 27.3  --  27.5 26.6  ?MCHC 31.8  --  32.1 29.8*  ?RDW 15.9*  --  15.9* 16.2*  ?PLT 150  --  172 178  ? ? ?Cardiac EnzymesNo results for input(s): TROPONINI in the last 168 hours. No results for input(s): TROPIPOC in the last 168 hours.  ? ?BNPNo results for input(s): BNP, PROBNP in the last 168 hours.  ? ?DDimer No results for input(s): DDIMER in the last 168  hours.  ? ?Radiology  ?  ?MR BRAIN WO CONTRAST ? ?Result Date: 04/10/2022 ?IMPRESSION: 1. Mild restricted diffusion involving bilateral caudate and parieto-occipital cortex, concerning for hypoxic/ischemic insult given the clinical history. No significant mass effect. 2. Evolving small right cerebellar infarct. 3. Similar trace intraventricular hemorrhage in the right occipital horn. These results will be called to the ordering clinician or representative by the Radiologist Assistant, and communication documented in the PACS or [employer]. Electronically Signed   By: Margaretha Sheffield M.D.   On: 04/10/2022 15:00  ? ?EEG adult ? ?Result Date: 04/10/2022 ?IMPRESSION: This study showed evidence of epileptogenicity with generalized onset and high potential for seizure recurrence. 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 No seizures were seen throughout the recording. Priyanka Barbra Sarks   ? ?Cardiac Studies  ? ?2D echo 04/08/2022: ?1. Left ventricular ejection fraction, by estimation, is 60 to 65%. The  ?left ventricle has normal function. The left ventricle has no regional  ?wall motion abnormalities. Left ventricular diastolic parameters are  ?indeterminate.  ? 2. Right ventricular systolic function is normal. The right ventricular  ?size is normal.  ? 3. The mitral valve was not well visualized. No evidence of mitral valve  ?regurgitation.  ? 4. The aortic valve was not well visualized. Aortic valve regurgitation  ?is not visualized. ?__________ ? ?LHC 04/07/2022: ?  CULPRIT LESION: Prox Cx to Mid Cx lesion is 90% stenosed.  Proximal to previously placed stent ?  A drug-eluting stent was successfully placed (overlapping previously placed stent) using a STENT ONYX FRONTIER 2.25X26.  Postdilated to 2.6 mm ?  Post intervention, there is a 0% residual stenosis. ?  Previously placed Mid Cx to Dist Cx stent (drug-eluting stent) is  widely  patent. ?  ---------------------------------------------------- ?  Mid LAD lesion is 35% stenosed. ?  1st Diag lesion is 55% stenosed.  2nd Diag lesion is 70% stenosed. ?  Previously placed Prox RCA stent (drug-eluting stent) is  widely patent. ?  Mid RCA to Dist RCA lesion is 35% stenosed. ?  ?SUMMARY ?3-4-vessel CAD with Culprit Lesion Being 85 to 90% mid LCx stenosis just prior to previously placed stent; widely patent RCA stent with mild diffuse disease distally, diffuse 50 to 65% stenosis in large 1st  Diag and focal 70% lesion in small 2nd Diag (not PCI target) ?Successful DES PCI of mid LCx lesion reducing from 85-90% to 0% with a Onyx Frontier DES 2.25 mm x 22 mm postdilated to 2.6 mm (overlaps previous stent); TIMI-3 flow pre and post ?Unable to cross aortic valve with multiple different catheters.  He has known mild aortic stenosis but also tortuous aortic root.  Very difficult to engage. => We will need echocardiogram ?  ?RECOMMENDATIONS ?PCCM to admit to ICU for vent management.  ABG in the Cath Lab was relatively normal with pH of 7.35 and adequate PaO2/PCO2. ?On Kengreal infusion upon leaving the Cath Lab.  Will need OG tube placed to give Plavix and then discontinue Kengreal. ?Gentle hydration for contrast =-> more than anticipated due to difficulty engaging RCA. ?Hold ARB until renal function stable ?Check 2D echo ?Restart beta-blocker and hydralazine, and increase atorvastatin 80 mg ?Monitor for neurologic recovery ? ?Patient Profile  ?   ?71 y.o. male with history of CAD with prior PCI to the LCx and RCA, PAF on Xarelto, DM, HTN, HLD, CKD stage III, anemia, and OSA on CPAP who presented with cardiac arrest on 4/28 and was found to have a lateral STEMI s/p PCI to the LCx with course complicated by hypoxic brain injury and Afib.  ? ?Assessment & Plan  ?  ?1. Cardiac arrest with lateral STEMI: ?-Status post PCI to the LCx on 4/28 ?-Post PCI echo with preserved LVSF ?-DAPT with ASA and Plavix ?-Remains  intubated and sedated with MRI brain and EEG yesterday concerning for hypoxic brain injury ?-Lipitor ?-Not on a beta blocker due to hypotension  ?-Prognosis to be determined by neurologic recovery  ? ?2.

## 2022-04-11 NOTE — Discharge Summary (Signed)
Physician Discharge Summary  ?Patient ID: ?Jason Neal ?MRN: [id number] ?DOB/AGE: 73-Nov-1952 73 y.o. ? ?Admit date: 04/06/2022 ?Discharge date: 04/24/2022 ? ? ?Brief Pt Description / Synopsis:  ?71 y.o. Male admitted with PEA Cardiac Arrest due to Acute STEMI, now with concern for anoxic brain injury with persistent myoclonus.  Requires transfer to [facility] for continuous EEG. ? ?Discharge Diagnoses:  ? ?STEMI ?PEA Cardiac Arrest ?Cardiogenic Shock ?Paroxsymal Atrial Fibrillation ?Acute Hypoxic Respiratory Failure ?Suspected Aspiration ?AKI on CKD Stage III ?Elevated LFT's ?Anemia ?Diabetes Mellitus ?Anoxic Brain Injury ?Myoclonus ?                                                ? ?          ?Discharge Summary:  ?71 y.o with significant PMH of OSA on CPAP, tSAH,T2DM, HTN, HLD, CKD stage III, chronic atrial fibrillation on Xarelto, aortic stenosis, CAD with history of PCI to the LCx and RCA with complicated PCI requiring transfer to [facility] for balloon pump who presented to the ED postcardiac arrest. ?  ?Per patient's wife who is currently at the bedside and witnessed the event, patient was sitting in all recliner when he suddenly gasped 3 times for air prior to becoming unresponsive. He was also sweating diffusely so EMS was called who arrived within 5 minutes.  On EMS arrival, patient was found pulseless therefore CPR was initiated and an airway established.  Initial rhythm on the monitor showed PEA therefore patient received Epi x 3 per ACLS protocol for a total of 6 minutes. He apparently required defibrillation x1 after initially being in PEA prior to ROSC being achieved.  A twelve-lead EKG was obtained which showed ST depression in the anterior leads and inferior leads.  Code STEMI was activated on route to the ED. ?  ?ED Course: In the emergency department, patient was completely and responsive but breathing on his own with assistance BVM respiration.  The temperature was ?C, the heart rate 86 beats/minute, the  blood pressure 132/76 mm Hg, the respiratory rate 20 breaths/minute, and the oxygen saturation 100% on vent.  Rapid sequence intubation was performed in the ED.  Patient remained hemodynamically stable while pending STEMI MD evaluation at the bedside.  Labs obtained per STEMI protocol.  [name] ?Pertinent Labs in Red/Diagnostics Findings: ?Na+/ K+: 139/3.5 ?Glucose: 273 ?BUN/Cr.:  31/1.74 ?CO2: 17 ?Anion gap: 16 ?Calcium: 8.1 ?AST/ALT: 218/184 ?  ?WBC/ TMAX:12.7 / afebrile ?Lactic acid:  ?COVID PCR: Negative ?  ?Troponin: 110 ?Arterial Blood Gas result:  pO2 142; pCO2 35; pH 7.35;  HCO3 19.3, %O2 Sat 99.8.  ?  ?Upon evaluation by STEMI on-call MD, patient was taken to the Cath Lab emergently for cardiac catheterization and possible PCI.  Patient was transferred to the ICU post cath and PCCM consulted for further management. ? ? ?Discharge Plan by Diagnosis:  ? ?Acute ST elevation myocardial infarction (STEMI) of lateral wall ?PEA Cardiac Arrest  ?Cardiogenic Shock  ?PAF ?HTN  ?PMHx: CAD, prior CFX/RCA DES Dec 2011, cath 03/04/12  patent stents, HLD, and HTN ?S/p PCI and DES to the left circumflex artery ?-Continuous telemetry monitoring ?-Hold outpatient antihypertensive for now  ?-Cardiology consulted appreciate input: per recommendations continue aspirin, plavix, and atorvastatin  ?-Prn vasopressin and levophed gtts to maintain map >65 ?  ?Acute Hypoxic Respiratory Failure in the setting of above,  possible Aspiration Event ?PMHx: OSA on CPAP ?-Wean PEEP and FiO2 for sats greater than 90% ?-Plateau pressures less than 30 cm H20 ?-VAP bundle in place ?-Intermittent chest x-ray & ABG ?-Continue unasyn for aspiration stop date 04/11/22 ?-Repeat respiratory culture pending  ?-PAD protocol to maintain RASS goal: Propofol and fentanyl gtts; continue scheduled oxycodone per tube  ?-Maintain RASS goal 0 to -1 ?  ?AKI on chronic stage III CKD secondary to ATN and contrast dye  ?-Trend BMP  ?-Replace electrolytes as  indicated ?-Monitor UOP ?-Avoid nephrotoxic medications  ?  ?Elevated LFTs due to shock liver secondary to cardiogenic shock  ?-Trend CMP ?  ?Anemia without obvious signs of bleeding ?-Hold xarelto per Cardiology recommendations ?-Trend CBC ?-Monitor for s/sx of bleeding and transfuse for hgb <8 ?  ?Diabetes mellitus ?-CBG's q4hrs ?-SSI  ?  ?Acute encephalopathy with myoclonus concerning for possible anoxia~MR Brain 04/28 revealed punctate acute/subacute infarct in the right cerebellar hemisphere. Trace intraventricular hemorrhage in the occipital horn of the right lateral ventricle. No evidence of global anoxic injury. Repeat study within 2-4 days may be obtained to exclude delayed injury ?-WUA daily  ?-Continue keppra at increased dose '@1000'$  mg q12hrs per neurology recommendations  ?-Neurology consulted appreciate input ?-MRI Brain and EEG pending  ?-Neurology recommends transfer to [facility] for continuous EEG due to concern for myoclonus vs seizures ? ? ?Significant Events:  ?4/28: Admitted to the ICU with acute ST elevation MI of the lateral wall s/p cath ?4/30: WUA performed myoclonus improved pt unable to follow commands; no response to BUE noxious stimuli; withdraws from pain BLE; Sedation restarted due to vent dyssynchrony, increased work of breathing and agitation/delirium.  Attempted to transition to precedex gtt and prn fentanyl with scheduled oxycodone.  However, pt did not tolerate propofol and fentanyl gtts restarted    ?5/1: MRI Brain and EEG concerning for anoxic injury ?5/2: Persistent rhythmic fasciculations of right mouth/lip concerning for myoclonus vs seizures.  Neurology recommends transfer to [facility].              ? ? ?Significant Diagnostic Studies:  ?4/28: Cardiac Cath>>Prox Cx to Mid Cx lesion is 90% stenosed ?Proximal to previously placed stent A drug-eluting stent was ?successfully placed (overlapping previously placed stent) using a STENT ONYX FRONTIER 2.25X26.  Postdilated to 2.6  mm Post intervention, there is a 0% residual stenosis. Previously placed Mid Cx to Dist Cx stent (drug-eluting stent) is  widely patent. Mid LAD lesion is 35% stenosed. 1st Diag lesion is 55% stenosed.  2nd Diag lesion is 70% stenosed. Previously placed Prox RCA stent (drug- ?eluting stent) is  widely patent. Mid RCA to Dist RCA lesion is 35% stenosed. ?4/28: MRI Brain>>Punctate acute/subacute infarct in the right cerebellar hemisphere. Trace intraventricular hemorrhage in the occipital horn of the right lateral ventricle. No evidence of global anoxic injury. Repeat study within 2-4 days may be obtained to exclude delayed injury. ?4/29: Echo>>EF estimated 60 to 65%  ?4/29: EEG>>Initial burst suppression pattern consistent with global cerebral dysfunction, medication effect, or both. After propofol was turned off, patient developed generalized periodic discharges at 1 Hz over a background of diffuse suppression timelocked to full body myoclonus, consistent with myoclonic status epilepticus. ?          ? ?Micro Data:  ?4/28: SARS-CoV-2 PCR> negative ?4/29: MRSA PCR>>negative  ?4/29: Respiratory>>negative  ?4/30: Respiratory>> ? ?Antimicrobials:  ?Unasyn 04/29>> ? ?Consults:  ?Cardiology ?Neurology ? ?Procedures:  ?4/28:Intubation ?4/28: Cardiac catheterization ?4/28: Right IJ central  line ?4/28: Left radial arterial line ? ?Discharge Exam:  ?GENERAL:71 year-old critically ill appearing male, NAD mechanically intubated  ?HEENT: Supple, no JVD  ?LUNGS: Clear throughout, even, non labored, orally intubated  ?CARDIOVASCULAR: NSR, s1s2, rrr, no r/g, 2+ radial/1+ distal pulses, no edema  ?ABDOMEN: Soft, non distended, +BS x4. ?EXTREMITIES: Normal bulk and tone ?NEUROLOGIC: Sedated, not following commands,  PERRL ?SKIN: No obvious rash, lesion, or ulcer.  ? ?Vitals:  ? 04/17/2022 0800 05/10/2022 0815 04/21/2022 0900 04/21/2022 1000  ?BP: (!) 128/47  (!) 127/59 (!) 117/49  ?Pulse: 62  62 (!) 58  ?Resp: '17  15 19  '$ ?Temp:  98.9 ?F  (37.2 ?C)    ?TempSrc:  Axillary    ?SpO2: 96%  97% 98%  ?Weight:      ?Height:      ? ? ? ?Discharge Labs:  ? ?BMET ?Recent Labs  ?Lab 04/07/22 ?1102 04/08/22 ?0434 04/09/22 ?0431 04/09/22 ?1830 04/10/22 ?4496 04/10/22 ?

## 2022-04-11 NOTE — Progress Notes (Signed)
Patient EMLA'ed to [facility] 4N bed 16. Report given by day shift . Vitals stable on transport and saved. Radial line, Right CVC, 2 peripherals stable . Foley emptied and documented . Family at bedside and educated on what to expect . Receiving Unit can instant message PRN for any other needed information  ?

## 2022-04-11 NOTE — Progress Notes (Signed)
Neurology progress note ? ?S:  ?NAEON. Myoclonus improved after increasing keppra to '1000mg'$  bid.  Continues to be on propofol and vent. ?MRI o/n with changes seen with anoxic brain injury in deep GM and occipital cortices b/l. ?EEG 5/1: GPDs and generalized background suppression ? ?EEG 4/28 c/w myoclonic status when propofol paused ? ?O: ? ?Vitals:  ? 04/23/2022 0600 04/14/2022 0815  ?BP: (!) 105/43   ?Pulse: (!) 55   ?Resp: 20   ?Temp:  98.9 ?F (37.2 ?C)  ?SpO2: 100%   ? ?Remained on fentanyl for the exam ?Propofol held ?Started having twitching of the eyelids, mouth within seconds of stopping propofol. Unchanged from yersterday ?General: Intubated sedated.  Sedation held as above ?HEENT: Normocephalic atraumatic ?CVS: Regular rhythm ?Respiratory: Vented ?Abdomen nondistended nontender ?Extremities well perfused ?Neurological exam ?Intubated ?Sedation as above-propofol was held for the examination ?Bilateral eyelid twitching and facial twitching predominate in the mouth started to reappear within seconds of discontinue propofol ?Nonverbal ?Does not open eyes to command ?Does not follow commands ?Cranial nerves: Pupils are small at about 2 mm, sluggishly reactive bilaterally, positive corneals bilaterally, does not breathe over the ventilator, oculocephalics are present.  Strong cough and gag ?Motor examination: some withdrawal to nox stim in b/l LE and localization with b/l UE when deep suctioning. ?Sensation: As above ? ?Assessment:  ?71 year old man status post cardiac arrest secondary to STEMI with EEG 24 hours out from the event with myoclonic status epilepticus that improved clinically on Keppra along with sedation on propofol.  MRI was done right after admission and did not show any definitive evidence of anoxic brain injury. Repeat MRI yesterday with evidence of anoxic brain injury involving deep GM and occipital cortices. ?Gets myoclonic when propofol held. ?On Keppra, Fentanyl and Propofol ?EEG with GPDs and  gen background suppression. ?Exam with sone localization on UE to nox stim and withdrawal to nox stim on LE ?Family requests higher level of care  ? ?IMP: Anoxic brain injury, myoclonus, myoclonic status epilepticus (resolved) ? ?Recommendations: ?- Continue keppra '1000mg'$  q 12 hrs ?-Family wanting transfer to tertiary care center for continuous EEG which I agree with given that the clinical exam and imaging picture does not suggest a profound anoxic picture but still the GPD's are very concerning for anoxic brain injury.  Want to make sure we rule out any ongoing seizure activity that can be treated.  To spot EEGs have showed background slowing and GPD's ?-Appreciate the supportive medical care per critical care team ?-I have reached out to my colleagues at [facility] who will be following the patient in consultation as well as will order continuous EEG. ?-I have had a detailed discussion with the wife and daughter as well as his sister at bedside again today and explained that at this time, his prognosis from a neurologically meaningful recovery still remains grim but they remain very hopeful and appreciative of the care that he has been receiving. ?Plan was discussed with Dr. Patsey Berthold in the ICU. ? ?-- ?Amie Portland, MD ?Neurologist ?[facility] ?Pager: [phone] ? ?CRITICAL CARE ATTESTATION ?Performed by: Amie Portland, MD ?Total critical care time: 50 minutes ?Critical care time was exclusive of separately billable procedures and treating other patients and/or supervising APPs/Residents/Students ?Critical care was necessary to treat or prevent imminent or life-threatening deterioration due to anoxic brain injury, myoclonus ?This patient is critically ill and at significant risk for neurological worsening and/or death and care requires constant monitoring. ?Critical care was time spent personally by  me on the following activities: development of treatment plan with patient and/or surrogate  as well as nursing, discussions with consultants, evaluation of patient's response to treatment, examination of patient, obtaining history from patient or surrogate, ordering and performing treatments and interventions, ordering and review of laboratory studies, ordering and review of radiographic studies, pulse oximetry, re-evaluation of patient's condition, participation in multidisciplinary rounds and medical decision making of high complexity in the care of this patient. ? ?

## 2022-04-11 NOTE — Progress Notes (Signed)
eLink Physician-Brief Progress Note ?Patient Name: Jason Neal ?DOB: 02-28-1951 ?MRN: [id number] ? ? ?Date of Service ? 04/10/2022  ?HPI/Events of Note ? Patient with out of hospital cardiac arrest secondary to STEMI, intubated and sedated due to coma, transferred to [facility] from [facility] for cEEG and further neurologic assessment.  ?eICU Interventions ? New Patient Evaluation.  ? ? ? ?  ? ?Jason Neal ?04/18/2022, 11:40 PM ?

## 2022-04-11 NOTE — H&P (Incomplete)
? ?NAME:  Jason Neal, MRN:  [id number], DOB:  1951-07-31, LOS: 4 ?ADMISSION DATE:  04/06/2022, CONSULTATION DATE: 04/30/2022 ?REFERRING MD: [facility] Dr. Patsey Berthold, CHIEF COMPLAINT: Concern for anoxic brain injury status postcardiac arrest on 04/07/2022 ? ?History of Present Illness:  ?71 year old male who has a history of coronary artery disease with a prior PCI to left circumflex and RCA he also has a past medical history of paroxysmal atrial fibrillation, diabetes mellitus, hypertension chronic kidney disease OSA on CPAP.  He presented with cardiac arrest was found to have a lateral STEMI required intervention stent.  MRI shows changes consistent with anoxic brain damage and transferred to [facility] intensive care unit for further evaluation by the neurological services. ? ?Pertinent  Medical History  ? ?Past Medical History:  ?Diagnosis Date  ? Allergies   ? HAS ALBUTEROL INHALER  ? Aortic valve stenosis   ? mild  ? Brain bleed ([place]) 08/2018  ? DUE TO FALL AND HIT HEAD-BLEED RESOLVED ON ITS OWN  ? CAD (coronary artery disease)   ? prior CFX/RCA PCI 2011, cath [facility] 3/13  ? Cancer [facility]) 2017  ? MELANOMA RIGHT EAR  ? Carotid bruit 03/14/11  ? CAROTID DUPLEX DOPPLER-Right bulb; demonstrated a mild amount of fibrous plaque w/o evidence of a significant diameter reduction, dissection or any other vascular abnormality.;Left ICA;-proximal, mid and distal segments of the internal carotid artery normal patency.mid and distal segments demonstrating moderate tortuosity..Mildly abnormal carotid duplex scan.  ? Chronic renal insufficiency, stage III (moderate) (HCC)   ? DJD (degenerative joint disease)   ? c-spine  ? Dyslipidemia   ? Dysrhythmia   ? GERD (gastroesophageal reflux disease)   ? Heart murmur   ? History of kidney stones   ? X1  ? Hypercholesteremia   ? Hypertension   ? PAF (paroxysmal atrial fibrillation) [facility]) March 2013  ? Sinus headache   ? Sleep apnea   ? on C-Pap  ? Type 2 diabetes mellitus  with chronic kidney disease ([place])   ? diet controlled  ? ? ? ?Significant Hospital Events: ?Including procedures, antibiotic start and stop dates in addition to other pertinent events   ?04/07/2022 cardiac arrest requiring left heart catheterization and stent placement ? ?Interim History / Subjective:  ?Admitted 04/07/2022 cardiac arrest stented some concern for anoxic brain injury is transferred to [facility] intensive care unit for further neurological evaluation i.e. continuous EEG. ? ?Objective   ?Blood pressure (!) 105/43, pulse (!) 55, temperature 98.9 ?F (37.2 ?C), temperature source Axillary, resp. rate 20, height '6\' 3"'$  (1.905 m), weight 110.8 kg, SpO2 100 %. ?   ?Vent Mode: PRVC ?FiO2 (%):  [30 %-36 %] 36 % ?Set Rate:  [20 bmp] 20 bmp ?Vt Set:  [520 mL] 520 mL ?PEEP:  [5 cmH20] 5 cmH20 ?Plateau Pressure:  [18 cmH20] 18 cmH20  ? ?Intake/Output Summary (Last 24 hours) at 04/26/2022 0933 ?Last data filed at 04/25/2022 0830 ?Gross per 24 hour  ?Intake 1939.3 ml  ?Output 1470 ml  ?Net 469.3 ml  ? ?Filed Weights  ? 04/06/22 2357 04/10/22 0500 04/28/2022 0500  ?Weight: 116.5 kg 110.5 kg 110.8 kg  ? ? ?Examination: ?General: *** ?HENT: *** ?Lungs: *** ?Cardiovascular: *** ?Abdomen: *** ?Extremities: *** ?Neuro: *** ?GU: *** ? ?Resolved Hospital Problem list   ?Ventilator dependent respiratory failure in the setting of cardiac arrest suspected anoxic brain injury.  Note history of sleep apnea may need noninvasive mechanical dilatory support was activated. ?Continue full ventilatory  support ?Bronchodilators as needed ?Admit to intensive care unit ? ?Altered mental status in the setting of cardiac arrest suspected anoxic brain injury.   MRI with concern for anoxic brain injury.  Evolving small right cerebellar infarct. ?Neuro evaluation ?Continuous EEG ?For neurological recovery ? ?Coronary artery disease status post cardiac arrest 04/07/2022 with left heart catheterization mid lesion circumflex and 90% (stent placed  with good flow.  Paroxysmal atrial fibrillation ?Currently on Plavix ?Cardiology to follow ?Restart anti hypertensive's ? ?Acute renal insufficiency in the setting of left heart catheterization with stent placement ? ?Lab Results  ?Component Value Date  ? CREATININE 1.42 (H) 05/01/2022  ? CREATININE 1.53 (H) 04/10/2022  ? CREATININE 1.83 (H) 04/09/2022  ? ?Avoid nephrotoxic agents ?Gentle fluid hydration ?Monitor creatinine ? ?Anemia ?Recent Labs  ?  04/10/22 ?6387 04/20/2022 ?0429  ?HGB 8.4* 8.1*  ? ?Currently holding Xarelto ?Serial hemoglobin ?Monitor for other sources of bleeding ? ?Diabetes mellitus type 2 ?CBG (last 3)  ?Recent Labs  ?  04/10/22 ?2333 04/12/2022 ?5643 05/07/2022 ?0802  ?GLUCAP 163* 171* 143*  ? ?Sliding-scale insulin as needed ? ? ?Assessment & Plan:  ? ? ?Best Practice (right click and "Reselect all SmartList Selections" daily)  ? ?Diet/type: tubefeeds ?DVT prophylaxis: other ?GI prophylaxis: PPI ?Lines: Central line ?Foley:  Yes, and it is still needed ?Code Status:  full code ?Last date of multidisciplinary goals of care discussion [tbd] ? ?Labs   ?CBC: ?Recent Labs  ?Lab 04/06/22 ?2359 04/07/22 ?0423 04/07/22 ?1604 04/08/22 ?3295 04/09/22 ?1884 04/09/22 ?1830 04/10/22 ?1660 04/14/2022 ?0429  ?WBC 12.7* 11.0*  --  11.0* 9.1  --  8.2 6.2  ?NEUTROABS 6.5  --   --   --  6.9  --  5.7 3.9  ?HGB 12.1* 10.8*   < > 9.2* 7.7* 8.7* 8.4* 8.1*  ?HCT 38.8* 34.0*   < > 28.9* 24.2* 27.2* 26.2* 27.2*  ?MCV 87.4 84.2  --  85.5 85.8  --  85.9 89.5  ?PLT 197 218  --  209 150  --  172 178  ? < > = values in this interval not displayed.  ? ? ?Basic Metabolic Panel: ?Recent Labs  ?Lab 04/07/22 ?1102 04/08/22 ?0434 04/09/22 ?0431 04/09/22 ?1830 04/10/22 ?6301 04/10/22 ?1621 05/08/2022 ?0429  ?NA 141 138 140  --  144  --  144  ?K 4.4 3.8 3.7  --  3.4*  --  3.4*  ?CL 108 108 112*  --  114*  --  112*  ?CO2 23 21* 23  --  23  --  22  ?GLUCOSE 209* 193* 184*  --  170*  --  186*  ?BUN 37* 38* 32*  --  28*  --  33*  ?CREATININE  2.25* 2.32* 1.83*  --  1.53*  --  1.42*  ?CALCIUM 8.1* 7.9* 7.7*  --  7.5*  --  7.4*  ?MG 1.4* 2.2 2.1 2.0 2.2 2.3 2.1  ?PHOS 3.9 3.7 2.4* 2.9 2.4* 3.1 3.4  ? ?GFR: ?Estimated Creatinine Clearance: 63.2 mL/min (A) (by C-G formula based on SCr of 1.42 mg/dL (H)). ?Recent Labs  ?Lab 04/07/22 ?1100 04/08/22 ?0434 04/09/22 ?0431 04/10/22 ?6010 04/13/2022 ?0429  ?WBC  --  11.0* 9.1 8.2 6.2  ?LATICACIDVEN 2.2*  --   --   --   --   ? ? ?Liver Function Tests: ?Recent Labs  ?Lab 04/06/22 ?2359 04/07/22 ?1102 04/08/22 ?0434 04/09/22 ?0431 04/10/22 ?0337  ?AST 218* 362* 225* 115* 80*  ?ALT 184* 189*  137* 85* 57*  ?ALKPHOS 50 41 35* 30* 28*  ?BILITOT 0.4 0.5 0.5 0.7 0.6  ?PROT 6.8 6.2* 6.3* 6.1* 5.9*  ?ALBUMIN 3.5 3.3* 3.2* 2.9* 2.6*  ? ?No results for input(s): LIPASE, AMYLASE in the last 168 hours. ?No results for input(s): AMMONIA in the last 168 hours. ? ?ABG ?   ?Component Value Date/Time  ? PHART 7.31 (L) 04/07/2022 0423  ? PCO2ART 40 04/07/2022 0423  ? PO2ART 161 (H) 04/07/2022 0423  ? HCO3 20.1 04/07/2022 0423  ? TCO2 23 08/22/2018 1239  ? ACIDBASEDEF 5.8 (H) 04/07/2022 0423  ? O2SAT 99.9 04/07/2022 0423  ?  ? ?Coagulation Profile: ?Recent Labs  ?Lab 04/06/22 ?2359  ?INR 1.7*  ? ? ?Cardiac Enzymes: ?No results for input(s): CKTOTAL, CKMB, CKMBINDEX, TROPONINI in the last 168 hours. ? ?HbA1C: ?Hgb A1c MFr Bld  ?Date/Time Value Ref Range Status  ?04/06/2022 11:59 PM 7.6 (H) 4.8 - 5.6 % Final  ?  Comment:  ?  (NOTE) ?Pre diabetes:          5.7%-6.4% ? ?Diabetes:              >6.4% ? ?Glycemic control for   <7.0% ?adults with diabetes ?  ?03/04/2012 01:07 PM 6.6 (H) <5.7 % Final  ?  Comment:  ?  (NOTE) ?                                                                      ?According to the ADA Clinical Practice Recommendations for 2011, when ?HbA1c is used as a screening test: ? >=6.5%   Diagnostic of Diabetes Mellitus ?          (if abnormal result is confirmed) ?5.7-6.4%   Increased risk of developing Diabetes  Mell[place] ?References:Diagnosis and Classification of Diabetes Mellitus,Diabetes ?HERD,4081,44(YJEHU 1):S62-S69 and Standards of Medical Care in         ?Diabetes - 2011,Diabetes Care,2011,34 (Suppl 1):S11-S61.  ? ? ?

## 2022-04-11 NOTE — Consult Note (Signed)
?                    NEURO HOSPITALIST CONSULT NOTE  ? ?Requesting physician: Dr. Duwayne Heck ? ?Reason for Consult: Anoxic brain injury ? ?History obtained from:  Chart    ? ?HPI:                                                                                                                                         ? Jason Neal is an 71 y.o. male with PMHx of traumatic head bleed, CAD, melanoma, carotid bruit, chronic renal insufficiency, dyslipidemia, HTN, PAF, DM2 and sleep apnea who initially presented to [facility] after cardiac arrest. Neurology was initially consulted on 4/28 for prognostication.  ? ?Per Dr. Artemio Aly consult note: "This is a 71 yo gentleman with hx OSA on CPAP, tSAH, DM2, HTN, HL, CKD stage III, a fib on xarelto, AS, CAD with hx PCI to Lcx and RCA with complicated PCI requiring transfer to [facility] for balloon pump who is admitted following cardiac arrest. Patient became unresponsive while sitting in recliner at home. EMS arrived within 5 minutes and were unable to get a pulse so CPR was initiated. Initial rhythm was PEA and he received epi x3 per ACLS protocol for total of 6 min. He then converted to shockable rhythm and was defibrillated once and ROSC was obtained. He was found to have a STEMI and taken urgently to the cath lab immediately upon arrival to ED. Personal review of MRI brain wo contrast showed no definitive e/o anoxic brain injury. When sedation is lightened patient develops full body myoclonus q 2 seconds." ? ?First EEG on 4/29 showed initial burst suppression pattern consistent with global cerebral dysfunction, medication effect, or both. After propofol was turned off, patient developed generalized periodic discharges at 1 Hz over a background of diffuse suppression timelocked to full body myoclonus, consistent with myoclonic status epilepticus. ? ?Repeat EEG on 5/1 revealed GPDs and generalized background suppression. Overall, the tracings showed evidence of epileptogenicity with  generalized onset and high potential for seizure recurrence. Additionally, there was profound diffuse encephalopathy, nonspecific to etiology but likely related to  anoxic/hypoxic brain injury. However, no seizures were seen throughout the recording. ? ?On Tuesday, clinical myoclonus was noted to have improved after increasing Keppra to 1000 mg BID. He continued to be on propofol and vent. MRI done at [facility] showed changes consistent with anoxic brain injury in the deep GM and occipital cortices bilaterally. ? ?The patient was transferred to [facility] for LTM EEG.  ? ? ?Past Medical History:  ?Diagnosis Date  ? Allergies   ? HAS ALBUTEROL INHALER  ? Aortic valve stenosis   ? mild  ? Brain bleed ([place]) 08/2018  ? DUE TO FALL AND HIT HEAD-BLEED RESOLVED ON ITS OWN  ? CAD (coronary artery disease)   ? prior CFX/RCA PCI 2011, cath [facility] 3/13  ?  Cancer [facility]) 2017  ? MELANOMA RIGHT EAR  ? Carotid bruit 03/14/11  ? CAROTID DUPLEX DOPPLER-Right bulb; demonstrated a mild amount of fibrous plaque w/o evidence of a significant diameter reduction, dissection or any other vascular abnormality.;Left ICA;-proximal, mid and distal segments of the internal carotid artery normal patency.mid and distal segments demonstrating moderate tortuosity..Mildly abnormal carotid duplex scan.  ? Chronic renal insufficiency, stage III (moderate) (HCC)   ? DJD (degenerative joint disease)   ? c-spine  ? Dyslipidemia   ? Dysrhythmia   ? GERD (gastroesophageal reflux disease)   ? Heart murmur   ? History of kidney stones   ? X1  ? Hypercholesteremia   ? Hypertension   ? PAF (paroxysmal atrial fibrillation) [facility]) March 2013  ? Sinus headache   ? Sleep apnea   ? on C-Pap  ? Type 2 diabetes mellitus with chronic kidney disease ([place])   ? diet controlled  ? ? ?Past Surgical History:  ?Procedure Laterality Date  ? C-spine surgery  2001  ? cardiac catherization  03/04/12  ? Dr Gwenlyn Found  ? CARDIOVERSION  03/07/2012  ? Procedure: CARDIOVERSION;  Surgeon: Sanda Klein, MD;   Location: [facility] ENDOSCOPY;  Service: Cardiovascular;  Laterality: N/A;  ? CATARACT EXTRACTION W/PHACO Right 06/25/2019  ? Procedure: CATARACT EXTRACTION PHACO AND INTRAOCULAR LENS PLACEMENT ([place]) RIGHT;  Surgeon: Leandrew Koyanagi, MD;  Location: [place];  Service: Ophthalmology;  Laterality: Right;  Diabetic - oral meds ?sleep apnea  ? CATARACT EXTRACTION W/PHACO Left 07/23/2019  ? Procedure: CATARACT EXTRACTION PHACO AND INTRAOCULAR LENS PLACEMENT ([place]) LEFT DIABETIC;  Surgeon: Leandrew Koyanagi, MD;  Location: [place];  Service: Ophthalmology;  Laterality: Left;  Diabetic - oral meds  ? COLONOSCOPY WITH PROPOFOL N/A 01/05/2017  ? Procedure: COLONOSCOPY WITH PROPOFOL;  Surgeon: Lollie Sails, MD;  Location: [facility] ENDOSCOPY;  Service: Endoscopy;  Laterality: N/A;  ? CORONARY ANGIOGRAPHY N/A 04/07/2022  ? Procedure: CORONARY ANGIOGRAPHY;  Surgeon: Leonie Man, MD;  Location: [place] CV LAB;  Service: Cardiovascular;  Laterality: N/A;  ? CORONARY ANGIOPLASTY WITH STENT PLACEMENT  Dec 2011  ? [place]  ? CORONARY/GRAFT ACUTE MI REVASCULARIZATION N/A 04/07/2022  ? Procedure: Coronary/Graft Acute MI Revascularization;  Surgeon: Leonie Man, MD;  Location: [place] CV LAB;  Service: Cardiovascular;  Laterality: N/A;  ? HERNIA REPAIR  1999  ? INGUINAL HERNIA REPAIR Left 12/17/2019  ? Procedure: HERNIA REPAIR INGUINAL ADULT;  Surgeon: Robert Bellow, MD;  Location: [facility] ORS;  Service: General;  Laterality: Left;  ? LEFT HEART CATHETERIZATION WITH CORONARY ANGIOGRAM N/A 03/04/2012  ? Procedure: LEFT HEART CATHETERIZATION WITH CORONARY ANGIOGRAM;  Surgeon: Lorretta Harp, MD;  Location: [facility] CATH LAB;  Service: Cardiovascular;  Laterality: N/A;  ? TEE WITHOUT CARDIOVERSION  03/07/2012  ? Procedure: TRANSESOPHAGEAL ECHOCARDIOGRAM (TEE);  Surgeon: Sanda Klein, MD;  Location: [place];  Service: Cardiovascular;  Laterality: N/A;  ? ? ?No family history on file.           ? ?Social History:  reports that he has never smoked. He has never used smokeless tobacco. He reports that he does not drink alcohol and does not use drugs. ? ?Allergies  ?Allergen Reactions  ? Angiotensin Receptor Blockers Cough  ?  (06/18/19 pt denies)  ? Demeclocycline Other (See Comments)  ?  AS ACHILD  ? ? ?MEDICATIONS:                                                                                                                     ?  Scheduled: ? aspirin  81 mg Per Tube Daily  ? atorvastatin  80 mg Per Tube Daily  ? chlorhexidine gluconate (MEDLINE KIT)  15 mL Mouth Rinse BID  ? Chlorhexidine Gluconate Cloth  6 each Topical Daily  ? clopidogrel  75 mg Per Tube Daily  ? docusate  100 mg Per Tube BID  ? heparin  5,000 Units Subcutaneous Q8H  ? insulin aspart  0-15 Units Subcutaneous Q4H  ? mouth rinse  15 mL Mouth Rinse 10 times per day  ? pantoprazole sodium  40 mg Per Tube Daily  ? ?Continuous: ? feeding supplement (VITAL AF 1.2 CAL) 1,000 mL (04/12/22 0237)  ? fentaNYL infusion INTRAVENOUS 150 mcg/hr (04/12/22 0600)  ? levETIRAcetam Stopped (04/30/2022 2320)  ? propofol (DIPRIVAN) infusion 40 mcg/kg/min (04/12/22 0455)  ? ? ? ?ROS:                                                                                                                                       ?Unable to obtain due to sedation.  ? ? ?Blood pressure (!) 161/69, pulse 66, resp. rate 16, SpO2 99 %. ? ? ?General Examination:                                                                                                      ? ?Physical Exam  ?HEENT-  [place]/AT   ?Lungs- Intubated ?Extremities- Warm and well perfused ? ?Neurological Examination ?Mental Status:  Intubated and sedated on propofol at a rate of 40 and fentanyl at 150. Furrows brow with some facial twitching during noxious stimuli. Does not open eyes. No attempts to communicate. Does not respond to name or loud clapping. No purposeful movements but will internally rotate legs and move  shoulders to noxious.  ?Cranial Nerves: ?II: No blink to threat. Pupils unreactive in the context of sedation.  ?III,IV, VI: No doll's eye reflex while under sedation ?V,VII: Weak corneal reflexes bilaterally

## 2022-04-11 NOTE — Progress Notes (Signed)
? ? ?NAME:  Jason Neal, MRN:  [id number], DOB:  07/28/1951, LOS: 4 ?ADMISSION DATE:  04/06/2022, CONSULTATION DATE:  04/07/22 ?REFERRING MD: Hinda Kehr, MD CHIEF COMPLAINT: Cardiac arrest ? ? ?HPI  ?71 y.o with significant PMH of OSA on CPAP, tSAH,T2DM, HTN, HLD, CKD stage III, chronic atrial fibrillation on Xarelto, aortic stenosis, CAD with history of PCI to the LCx and RCA with complicated PCI requiring transfer to [facility] for balloon pump who presented to the ED postcardiac arrest. ? ?Per patient's wife who is currently at the bedside and witnessed the event, patient was sitting in all recliner when he suddenly gasped 3 times for air prior to becoming unresponsive. He was also sweating diffusely so EMS was called who arrived within 5 minutes.  On EMS arrival, patient was found pulseless therefore CPR was initiated and an airway established.  Initial rhythm on the monitor showed PEA therefore patient received Epi x 3 per ACLS protocol for a total of 6 minutes. He apparently required defibrillation x1 after initially being in PEA prior to ROSC being achieved.  A twelve-lead EKG was obtained which showed ST depression in the anterior leads and inferior leads.  Code STEMI was activated on route to the ED. ? ?ED Course: In the emergency department, patient was completely and responsive but breathing on his own with assistance BVM respiration.  The temperature was ?C, the heart rate 86 beats/minute, the blood pressure 132/76 mm Hg, the respiratory rate 20 breaths/minute, and the oxygen saturation 100% on vent.  Rapid sequence intubation was performed in the ED.  Patient remained hemodynamically stable while pending STEMI MD evaluation at the bedside.  Labs obtained per STEMI protocol.  [name] ?Pertinent Labs in Red/Diagnostics Findings: ?Na+/ K+: 139/3.5 ?Glucose: 273 ?BUN/Cr.:  31/1.74 ?CO2: 17 ?Anion gap: 16 ?Calcium: 8.1 ?AST/ALT: 218/184 ?  ?WBC/ TMAX:12.7 / afebrile ?Lactic acid:  ?COVID PCR: Negative ?  ?Troponin:  110 ?Arterial Blood Gas result:  pO2 142; pCO2 35; pH 7.35;  HCO3 19.3, %O2 Sat 99.8.  ? ?Upon evaluation by STEMI on-call MD, patient was taken to the Cath Lab emergently for cardiac catheterization and possible PCI.  Patient was transferred to the ICU post cath and PCCM consulted for further management. ? ?Past Medical History  ?OSA on CPAP, tSAH,T2DM, HTN, HLD, CKD stage III, chronic atrial fibrillation on Xarelto, aortic stenosis, CAD with history of PCI to the LCx and RCA with complicated PCI requiring transfer to [facility] for balloon pump ? ?Significant Hospital Events   ?4/28: Admitted to the ICU with acute ST elevation MI of the lateral wall s/p cath ?4/30: WUA performed myoclonus improved pt unable to follow commands; no response to BUE noxious stimuli; withdraws from pain BLE; Sedation restarted due to vent dyssynchrony, increased work of breathing and agitation/delirium.  Attempted to transition to precedex gtt and prn fentanyl with scheduled oxycodone.  However, pt did not tolerate propofol and fentanyl gtts restarted  ?5/1: MRI Brain and EEG concerning for anoxic injury ?5/2: Persistent rhythmic fasciculations of right mouth/lip concerning for myoclonus vs seizures.  Neurology recommends transfer to [facility]. ? ?Consults:  ?Cardiology ?Neurology ? ?Procedures:  ?4/28:Intubation ?4/28: Cardiac catheterization ?4/28: Right IJ central line ?4/28: Left radial arterial line ? ?Significant Diagnostic Tests:  ?4/28: Cardiac Cath>>Prox Cx to Mid Cx lesion is 90% stenosed ?Proximal to previously placed stent A drug-eluting stent was ?successfully placed (overlapping previously placed stent) using a STENT ONYX FRONTIER 2.25X26.  Postdilated to 2.6 mm Post intervention, there is  a 0% residual stenosis. Previously placed Mid Cx to Dist Cx stent (drug-eluting stent) is  widely patent. Mid LAD lesion is 35% stenosed. 1st Diag lesion is 55% stenosed.  2nd Diag lesion is 70% stenosed. Previously placed Prox RCA stent  (drug- ?eluting stent) is  widely patent. Mid RCA to Dist RCA lesion is 35% stenosed. ?4/28: MRI Brain>>Punctate acute/subacute infarct in the right cerebellar hemisphere. Trace intraventricular hemorrhage in the occipital horn of the right lateral ventricle. No evidence of global anoxic injury. Repeat study within 2-4 days may be obtained to exclude delayed injury. ?4/29: Echo>>EF estimated 60 to 65%  ?4/29: EEG>>Initial burst suppression pattern consistent with global cerebral dysfunction, medication effect, or both. After propofol was turned off, patient developed generalized periodic discharges at 1 Hz over a background of diffuse suppression timelocked to full body myoclonus, consistent with myoclonic status epilepticus. ? ?Micro Data:  ?4/28: SARS-CoV-2 PCR> negative ?4/29: MRSA PCR>>negative  ?4/29: Respiratory>>negative  ?4/30: Respiratory>> ? ?Antimicrobials:  ?Unasyn 04/29>> ? ?Subjective:  ?-No acute events overnight.   ?- Repeat MRI yesterday with evidence of anoxic brain injury involving deep GM and occipital cortices. ?-Pt currently sedated with propofol and fentanyl gtts ~ when propofol weaned down, with rhythmic twitching or right lip/mouth concerning for myoclonus ?-Neurology recommends transfer to [facility] for continuous EEG ? ?OBJECTIVE  ?Blood pressure (!) 105/43, pulse (!) 55, temperature 98.9 ?F (37.2 ?C), temperature source Axillary, resp. rate 20, height '6\' 3"'$  (1.905 m), weight 110.8 kg, SpO2 100 %. ?   ?Vent Mode: PRVC ?FiO2 (%):  [30 %-36 %] 36 % ?Set Rate:  [20 bmp] 20 bmp ?Vt Set:  [520 mL] 520 mL ?PEEP:  [5 cmH20] 5 cmH20 ?Plateau Pressure:  [18 cmH20] 18 cmH20  ? ?Intake/Output Summary (Last 24 hours) at 04/17/2022 0853 ?Last data filed at 04/29/2022 0830 ?Gross per 24 hour  ?Intake 1939.3 ml  ?Output 1470 ml  ?Net 469.3 ml  ? ? ?Filed Weights  ? 04/06/22 2357 04/10/22 0500 04/14/2022 0500  ?Weight: 116.5 kg 110.5 kg 110.8 kg  ? ?Physical Examination  ?GENERAL:71 year-old critically ill appearing  male, NAD mechanically intubated  ?HEENT: Supple, no JVD  ?LUNGS: coarse throughout, even, non labored, synchronous with vent orally intubated  ?CARDIOVASCULAR: NSR, s1s2, rrr, no r/g, 2+ radial/1+ distal pulses, no edema  ?ABDOMEN: Soft, non distended, +BS x4. ?EXTREMITIES: Normal bulk and tone ?NEUROLOGIC: Sedated, not following commands,  PERRL ?SKIN: No obvious rash, lesion, or ulcer.  ? ?Labs/imaging that I havepersonally reviewed  ?(right click and "Reselect all SmartList Selections" daily)  ? ?  ?Labs   ?CBC: ?Recent Labs  ?Lab 04/06/22 ?2359 04/07/22 ?0423 04/07/22 ?1604 04/08/22 ?3235 04/09/22 ?5732 04/09/22 ?1830 04/10/22 ?2025 04/24/2022 ?0429  ?WBC 12.7* 11.0*  --  11.0* 9.1  --  8.2 6.2  ?NEUTROABS 6.5  --   --   --  6.9  --  5.7 3.9  ?HGB 12.1* 10.8*   < > 9.2* 7.7* 8.7* 8.4* 8.1*  ?HCT 38.8* 34.0*   < > 28.9* 24.2* 27.2* 26.2* 27.2*  ?MCV 87.4 84.2  --  85.5 85.8  --  85.9 89.5  ?PLT 197 218  --  209 150  --  172 178  ? < > = values in this interval not displayed.  ? ? ? ?Basic Metabolic Panel: ?Recent Labs  ?Lab 04/07/22 ?1102 04/08/22 ?0434 04/09/22 ?0431 04/09/22 ?1830 04/10/22 ?4270 04/10/22 ?1621 04/10/2022 ?0429  ?NA 141 138 140  --  144  --  144  ?K 4.4 3.8 3.7  --  3.4*  --  3.4*  ?CL 108 108 112*  --  114*  --  112*  ?CO2 23 21* 23  --  23  --  22  ?GLUCOSE 209* 193* 184*  --  170*  --  186*  ?BUN 37* 38* 32*  --  28*  --  33*  ?CREATININE 2.25* 2.32* 1.83*  --  1.53*  --  1.42*  ?CALCIUM 8.1* 7.9* 7.7*  --  7.5*  --  7.4*  ?MG 1.4* 2.2 2.1 2.0 2.2 2.3 2.1  ?PHOS 3.9 3.7 2.4* 2.9 2.4* 3.1 3.4  ? ? ?GFR: ?Estimated Creatinine Clearance: 63.2 mL/min (A) (by C-G formula based on SCr of 1.42 mg/dL (H)). ?Recent Labs  ?Lab 04/07/22 ?1100 04/08/22 ?0434 04/09/22 ?0431 04/10/22 ?3532 04/27/2022 ?0429  ?WBC  --  11.0* 9.1 8.2 6.2  ?LATICACIDVEN 2.2*  --   --   --   --   ? ? ? ?Liver Function Tests: ?Recent Labs  ?Lab 04/06/22 ?2359 04/07/22 ?1102 04/08/22 ?0434 04/09/22 ?0431 04/10/22 ?0337  ?AST 218* 362*  225* 115* 80*  ?ALT 184* 189* 137* 85* 57*  ?ALKPHOS 50 41 35* 30* 28*  ?BILITOT 0.4 0.5 0.5 0.7 0.6  ?PROT 6.8 6.2* 6.3* 6.1* 5.9*  ?ALBUMIN 3.5 3.3* 3.2* 2.9* 2.6*  ? ? ?No results for input(s): LIPA

## 2022-04-11 NOTE — Progress Notes (Signed)
PHARMACY CONSULT NOTE ? ?Pharmacy Consult for Electrolyte Monitoring and Replacement  ? ?Recent Labs: ?Potassium (mmol/L)  ?Date Value  ?04/26/2022 3.4 (L)  ? ?Magnesium (mg/dL)  ?Date Value  ?04/25/2022 2.1  ? ?Calcium (mg/dL)  ?Date Value  ?04/14/2022 7.4 (L)  ? ?Albumin (g/dL)  ?Date Value  ?04/10/2022 2.6 (L)  ? ?Phosphorus (mg/dL)  ?Date Value  ?04/19/2022 3.4  ? ?Sodium (mmol/L)  ?Date Value  ?04/17/2022 144  ? ?Corrected Calcium: 8.4 mg/dL ? ?Assessment: 71 y.o with significant PMH of OSA on CPAP, tSAH,T2DM, HTN, HLD, CKD stage III, chronic atrial fibrillation on Xarelto, aortic stenosis, CAD with history of PCI to the LCx and RCA with complicated PCI. In cardiogenic shock.  ? ?Also in AKI on CKD3 secondary to ATN and contrast dye. Intubated and sedated on propofol gtt and fentanyl. ? ?Goal of Therapy:  ?Potassium 4.0 - 5.1 mmol/L ?Magnesium 2.0 - 2.4 mg/dL ?All Other Electrolytes within normal limits ? ?Plan:  K 3.4 unchaned after Kcl 40 x 1 yesterday.  ?Give KCl 40 mEq per tube x 2.  ? ?Noted elevated LFTs, which are improving 5/2. Will continue to monitor.  ? ?Triglycerides 335, however remain < 500 x 2 days. Will discuss sedation plans on rounds.  ? ? ?Wynelle Cleveland, PharmD ?Pharmacy Resident  ?05/08/2022 ?7:23 AM ? ?

## 2022-04-12 ENCOUNTER — Inpatient Hospital Stay (HOSPITAL_COMMUNITY): Payer: Medicare Other

## 2022-04-12 DIAGNOSIS — G9340 Encephalopathy, unspecified: Secondary | ICD-10-CM | POA: Diagnosis not present

## 2022-04-12 DIAGNOSIS — G40901 Epilepsy, unspecified, not intractable, with status epilepticus: Secondary | ICD-10-CM

## 2022-04-12 DIAGNOSIS — I469 Cardiac arrest, cause unspecified: Secondary | ICD-10-CM | POA: Diagnosis not present

## 2022-04-12 DIAGNOSIS — Z9911 Dependence on respirator [ventilator] status: Secondary | ICD-10-CM | POA: Diagnosis not present

## 2022-04-12 DIAGNOSIS — R739 Hyperglycemia, unspecified: Secondary | ICD-10-CM | POA: Diagnosis not present

## 2022-04-12 DIAGNOSIS — G931 Anoxic brain damage, not elsewhere classified: Secondary | ICD-10-CM | POA: Diagnosis not present

## 2022-04-12 LAB — CBC
HCT: 27.8 % — ABNORMAL LOW (ref 39.0–52.0)
Hemoglobin: 8.8 g/dL — ABNORMAL LOW (ref 13.0–17.0)
MCH: 28.2 pg (ref 26.0–34.0)
MCHC: 31.7 g/dL (ref 30.0–36.0)
MCV: 89.1 fL (ref 80.0–100.0)
Platelets: 210 10*3/uL (ref 150–400)
RBC: 3.12 MIL/uL — ABNORMAL LOW (ref 4.22–5.81)
RDW: 16.5 % — ABNORMAL HIGH (ref 11.5–15.5)
WBC: 7.8 10*3/uL (ref 4.0–10.5)
nRBC: 0.5 % — ABNORMAL HIGH (ref 0.0–0.2)

## 2022-04-12 LAB — BASIC METABOLIC PANEL
Anion gap: 10 (ref 5–15)
BUN: 29 mg/dL — ABNORMAL HIGH (ref 8–23)
CO2: 20 mmol/L — ABNORMAL LOW (ref 22–32)
Calcium: 8.2 mg/dL — ABNORMAL LOW (ref 8.9–10.3)
Chloride: 117 mmol/L — ABNORMAL HIGH (ref 98–111)
Creatinine, Ser: 1.39 mg/dL — ABNORMAL HIGH (ref 0.61–1.24)
GFR, Estimated: 54 mL/min — ABNORMAL LOW (ref >60)
Glucose, Bld: 184 mg/dL — ABNORMAL HIGH (ref 70–99)
Potassium: 3.7 mmol/L (ref 3.5–5.1)
Sodium: 147 mmol/L — ABNORMAL HIGH (ref 135–145)

## 2022-04-12 LAB — TYPE AND SCREEN
ABO/RH(D): A NEG
Antibody Screen: NEGATIVE

## 2022-04-12 LAB — TRIGLYCERIDES: Triglycerides: 441 mg/dL — ABNORMAL HIGH (ref <150)

## 2022-04-12 LAB — GLUCOSE, CAPILLARY
Glucose-Capillary: 169 mg/dL — ABNORMAL HIGH (ref 70–99)
Glucose-Capillary: 192 mg/dL — ABNORMAL HIGH (ref 70–99)
Glucose-Capillary: 240 mg/dL — ABNORMAL HIGH (ref 70–99)
Glucose-Capillary: 263 mg/dL — ABNORMAL HIGH (ref 70–99)
Glucose-Capillary: 306 mg/dL — ABNORMAL HIGH (ref 70–99)
Glucose-Capillary: 341 mg/dL — ABNORMAL HIGH (ref 70–99)

## 2022-04-12 LAB — MAGNESIUM
Magnesium: 2.2 mg/dL (ref 1.7–2.4)
Magnesium: 2.2 mg/dL (ref 1.7–2.4)

## 2022-04-12 LAB — PHOSPHORUS
Phosphorus: 3 mg/dL (ref 2.5–4.6)
Phosphorus: 2.9 mg/dL (ref 2.5–4.6)

## 2022-04-12 LAB — MRSA NEXT GEN BY PCR, NASAL: MRSA by PCR Next Gen: NOT DETECTED

## 2022-04-12 IMAGING — DX DG CHEST 1V PORT
1 series · 1 of 1 positions shown · non-contrast
Comparison: Multiple priors including most recent radiograph [DATE].

CLINICAL DATA: Respiratory failure, ETT history of cardiac arrest.

EXAM:
PORTABLE CHEST 1 VIEW

[chest]
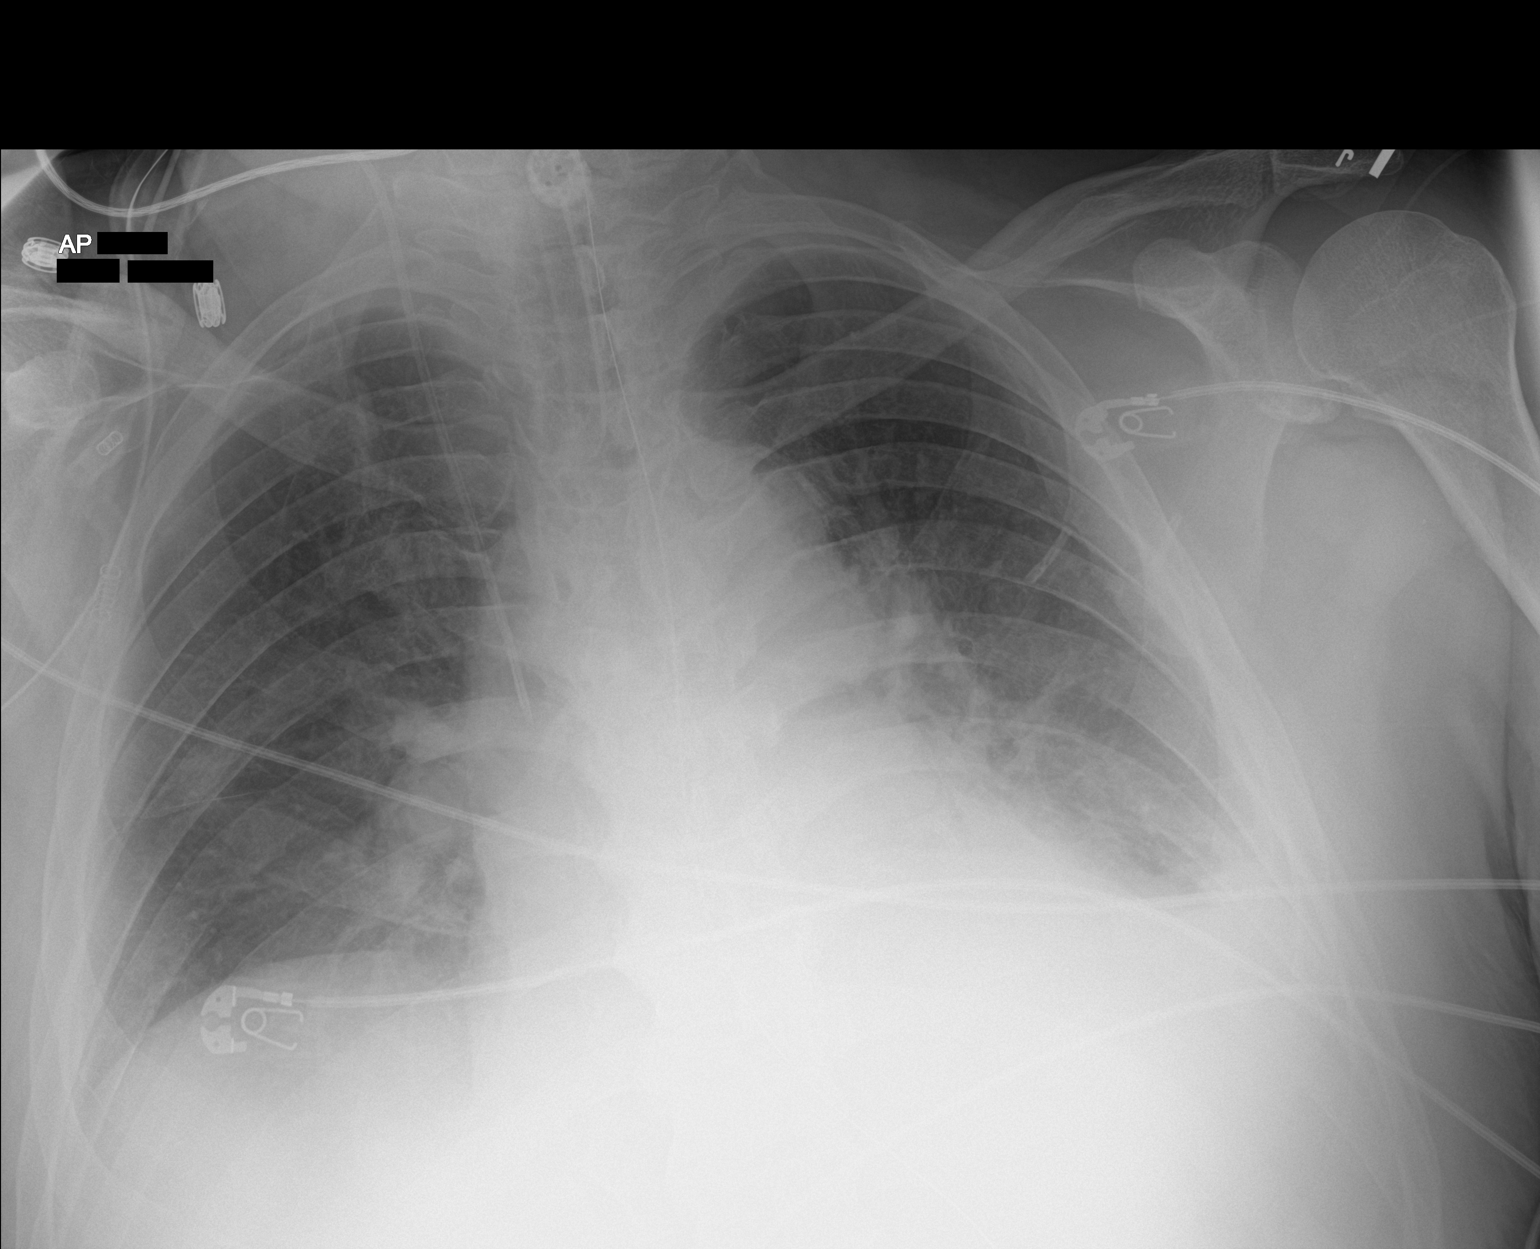

[1 of 1 positions shown; findings below may reference images not displayed]

FINDINGS: Endotracheal tube with tip at the thoracic inlet. Nasogastric tube
courses below the diaphragm with tip obscured by collimation. Right
IJ CVC with tip overlying the superior cavoatrial junction.

The heart size and mediastinal contours are unchanged.

Similar small left pleural effusion and left basilar airspace
opacity. Low lung volumes with bibasilar atelectasis.

The visualized skeletal structures are unchanged.
IMPRESSION: 1. Similar small left pleural effusion and adjacent airspace
opacity.
2. Stable lines and tubes.

## 2022-04-12 MED ORDER — ACETAMINOPHEN 650 MG RE SUPP: 650.0000 mg | RECTAL | Status: DC | PRN

## 2022-04-12 MED ORDER — VITAL AF 1.2 CAL PO LIQD
1000.0000 mL | ORAL | Status: AC
Start: 2022-04-12 — End: 2022-04-12
  Administered 2022-04-12: 1000 mL

## 2022-04-12 MED ORDER — FUROSEMIDE 10 MG/ML IJ SOLN
40.0000 mg | Freq: Once | INTRAMUSCULAR | Status: AC
  Administered 2022-04-12: 40 mg via INTRAVENOUS
  Filled 2022-04-12: qty 4

## 2022-04-12 MED ORDER — INSULIN ASPART 100 UNIT/ML IJ SOLN
3.0000 [IU] | INTRAMUSCULAR | Status: DC
  Administered 2022-04-12 – 2022-04-13 (×6): 3 [IU] via SUBCUTANEOUS

## 2022-04-12 MED ORDER — VALPROATE SODIUM 100 MG/ML IV SOLN
750.0000 mg | Freq: Three times a day (TID) | INTRAVENOUS | Status: DC
  Administered 2022-04-13 – 2022-04-16 (×9): 750 mg via INTRAVENOUS
  Filled 2022-04-12 (×14): qty 7.5

## 2022-04-12 MED ORDER — POTASSIUM CHLORIDE 20 MEQ PO PACK
40.0000 meq | PACK | Freq: Once | ORAL | Status: AC
  Administered 2022-04-12: 40 meq
  Filled 2022-04-12: qty 2

## 2022-04-12 MED ORDER — ACETAMINOPHEN 325 MG PO TABS
650.0000 mg | ORAL_TABLET | Freq: Four times a day (QID) | ORAL | Status: DC | PRN
  Administered 2022-04-12: 650 mg
  Filled 2022-04-12 (×2): qty 2

## 2022-04-12 MED ORDER — PIPERACILLIN-TAZOBACTAM 3.375 G IVPB
3.3750 g | Freq: Three times a day (TID) | INTRAVENOUS | Status: DC
  Administered 2022-04-12 – 2022-04-13 (×3): 3.375 g via INTRAVENOUS
  Filled 2022-04-12 (×4): qty 50

## 2022-04-12 MED ORDER — VALPROATE SODIUM 100 MG/ML IV SOLN
3000.0000 mg | INTRAVENOUS | Status: AC
  Administered 2022-04-12: 3000 mg via INTRAVENOUS
  Filled 2022-04-12: qty 30

## 2022-04-12 MED ORDER — PROSOURCE TF PO LIQD
45.0000 mL | Freq: Three times a day (TID) | ORAL | Status: DC
  Administered 2022-04-12 – 2022-04-13 (×4): 45 mL
  Filled 2022-04-12 (×4): qty 45

## 2022-04-12 MED ORDER — HYDRALAZINE HCL 20 MG/ML IJ SOLN
20.0000 mg | Freq: Four times a day (QID) | INTRAMUSCULAR | Status: DC | PRN
  Filled 2022-04-12: qty 1

## 2022-04-12 MED ORDER — OSMOLITE 1.5 CAL PO LIQD
1000.0000 mL | ORAL | Status: DC
  Administered 2022-04-12 – 2022-04-13 (×2): 1000 mL

## 2022-04-12 MED ORDER — HYDRALAZINE HCL 20 MG/ML IJ SOLN
10.0000 mg | Freq: Once | INTRAMUSCULAR | Status: AC
  Administered 2022-04-12: 10 mg via INTRAVENOUS

## 2022-04-12 MED ORDER — LABETALOL HCL 5 MG/ML IV SOLN
10.0000 mg | INTRAVENOUS | Status: DC | PRN
  Administered 2022-04-12 (×2): 10 mg via INTRAVENOUS
  Filled 2022-04-12: qty 4

## 2022-04-12 MED ORDER — INSULIN ASPART 100 UNIT/ML IJ SOLN
0.0000 [IU] | INTRAMUSCULAR | Status: DC
  Administered 2022-04-12: 4 [IU] via SUBCUTANEOUS
  Administered 2022-04-13: 20 [IU] via SUBCUTANEOUS
  Administered 2022-04-13: 15 [IU] via SUBCUTANEOUS
  Administered 2022-04-13: 20 [IU] via SUBCUTANEOUS

## 2022-04-12 MED ORDER — SODIUM CHLORIDE 0.9 % IV SOLN
3.0000 g | Freq: Four times a day (QID) | INTRAVENOUS | Status: DC
  Administered 2022-04-12: 3 g via INTRAVENOUS
  Filled 2022-04-12: qty 8

## 2022-04-12 MED ORDER — ALBUTEROL SULFATE (2.5 MG/3ML) 0.083% IN NEBU
2.5000 mg | INHALATION_SOLUTION | Freq: Three times a day (TID) | RESPIRATORY_TRACT | Status: DC
  Administered 2022-04-12 – 2022-04-13 (×2): 2.5 mg via RESPIRATORY_TRACT
  Filled 2022-04-12 (×2): qty 3

## 2022-04-12 MED ORDER — VALPROATE SODIUM 100 MG/ML IV SOLN
500.0000 mg | Freq: Three times a day (TID) | INTRAVENOUS | Status: DC
  Administered 2022-04-12: 500 mg via INTRAVENOUS
  Filled 2022-04-12 (×3): qty 5

## 2022-04-12 MED ORDER — HYDRALAZINE HCL 20 MG/ML IJ SOLN
10.0000 mg | Freq: Four times a day (QID) | INTRAMUSCULAR | Status: DC | PRN
  Administered 2022-04-12: 10 mg via INTRAVENOUS
  Filled 2022-04-12: qty 1

## 2022-04-12 MED ORDER — LORAZEPAM 2 MG/ML IJ SOLN
INTRAMUSCULAR | Status: AC
  Administered 2022-04-12: 2 mg
  Filled 2022-04-12: qty 1

## 2022-04-12 MED ORDER — PIPERACILLIN-TAZOBACTAM 3.375 G IVPB 30 MIN
3.3750 g | Freq: Once | INTRAVENOUS | Status: AC
  Administered 2022-04-12: 3.375 g via INTRAVENOUS
  Filled 2022-04-12 (×2): qty 50

## 2022-04-12 NOTE — Progress Notes (Signed)
?  Transition of Care (TOC) Screening Note ? ? ?Patient Details  ?Name: Jason Neal ?Date of Birth: [birth year] ? ? ?Transition of Care (TOC) CM/SW Contact:    ?Benard Halsted, LCSW ?Phone Number: ?04/12/2022, 5:28 PM ? ? ? ?Transition of Care Department [facility]) has reviewed patient and no TOC needs have been identified at this time. We will continue to monitor patient advancement through interdisciplinary progression rounds. If new patient transition needs arise, please place a TOC consult. ? ? ?

## 2022-04-12 NOTE — Progress Notes (Signed)
? ?Progress Note ? ?Patient Name: Jason Neal ?Date of Encounter: 04/12/2022 ? ?[facility] Cardiologist: Quay Burow, MD  ? ?Subjective  ? ?sedated ? ?Inpatient Medications  ?  ?Scheduled Meds: ? aspirin  81 mg Per Tube Daily  ? atorvastatin  80 mg Per Tube Daily  ? chlorhexidine gluconate (MEDLINE KIT)  15 mL Mouth Rinse BID  ? Chlorhexidine Gluconate Cloth  6 each Topical Daily  ? clopidogrel  75 mg Per Tube Daily  ? docusate  100 mg Per Tube BID  ? heparin  5,000 Units Subcutaneous Q8H  ? insulin aspart  0-15 Units Subcutaneous Q4H  ? insulin aspart  3 Units Subcutaneous Q4H  ? mouth rinse  15 mL Mouth Rinse 10 times per day  ? pantoprazole sodium  40 mg Per Tube Daily  ? potassium chloride  40 mEq Per Tube Once  ? ?Continuous Infusions: ? ampicillin-sulbactam (UNASYN) IV    ? feeding supplement (VITAL AF 1.2 CAL) 1,000 mL (04/12/22 0237)  ? fentaNYL infusion INTRAVENOUS 150 mcg/hr (04/12/22 0941)  ? levETIRAcetam 1,000 mg (04/12/22 1029)  ? propofol (DIPRIVAN) infusion 40 mcg/kg/min (04/12/22 0849)  ? ?PRN Meds: ?albuterol, fentaNYL, polyethylene glycol  ? ?Vital Signs  ?  ?Vitals:  ? 04/12/22 0734 04/12/22 0800 04/12/22 1029 04/12/22 1036  ?BP:  132/61    ?Pulse: 64 61  66  ?Resp: _0 ?Temp:  (!) 101 ?F (38.3 ?C)    ?TempSrc:  Axillary    ?SpO2: 99% 93% 98% 94%  ?Weight:      ? ? ?Intake/Output Summary (Last 24 hours) at 04/12/2022 1108 ?Last data filed at 04/12/2022 0750 ?Gross per 24 hour  ?Intake 1428.32 ml  ?Output 150 ml  ?Net 1278.32 ml  ? ? ?  04/12/2022  ?  5:00 AM 04/15/2022  ?  5:00 AM 04/10/2022  ?  5:00 AM  ?Last 3 Weights  ?Weight (lbs) 260 lb 12.9 oz 244 lb 4.3 oz 243 lb 9.7 oz  ?Weight (kg) 118.3 kg 110.8 kg 110.5 kg  ?   ? ?Telemetry  ?  ?SR - Personally Reviewed ? ?ECG  ?  ?No new - Personally Reviewed ?Last EKG 04/06/22 with significant T wave inversions inf Lat  ? ?Physical Exam  ?Exam per Dr. Oval Linsey  ? ?Neuro evaluating now  ?Labs  ?  ?High Sensitivity Troponin:   ?Recent Labs  ?Lab  04/06/22 ?2359 04/07/22 ?0423 04/07/22 ?1749  ?TROPONINIHS 110* 21,580* >24,000*  ?   ?Chemistry ?Recent Labs  ?Lab 04/08/22 ?0434 04/09/22 ?0431 04/09/22 ?1830 04/10/22 ?4496 04/10/22 ?1621 05/07/2022 ?0429 04/12/22 ?7591  ?NA 138 140  --  144  --  144 147*  ?K 3.8 3.7  --  3.4*  --  3.4* 3.7  ?CL 108 112*  --  114*  --  112* 117*  ?CO2 21* 23  --  23  --  22 20*  ?GLUCOSE 193* 184*  --  170*  --  186* 184*  ?BUN 38* 32*  --  28*  --  33* 29*  ?CREATININE 2.32* 1.83*  --  1.53*  --  1.42* 1.39*  ?CALCIUM 7.9* 7.7*  --  7.5*  --  7.4* 8.2*  ?MG 2.2 2.1   < > 2.2 2.3 2.1 2.2  ?PROT 6.3* 6.1*  --  5.9*  --   --   --   ?ALBUMIN 3.2* 2.9*  --  2.6*  --   --   --   ?  AST 225* 115*  --  80*  --   --   --   ?ALT 137* 85*  --  57*  --   --   --   ?ALKPHOS 35* 30*  --  28*  --   --   --   ?BILITOT 0.5 0.7  --  0.6  --   --   --   ?GFRNONAA 29* 39*  --  48*  --  53* 54*  ?ANIONGAP 9 5  --  7  --  10 10  ? < > = values in this interval not displayed.  ?  ?Lipids  ?Recent Labs  ?Lab 04/06/22 ?2359 04/08/22 ?0434 04/12/22 ?3419  ?CHOL 140  --   --   ?TRIG 221*   < > 441*  ?HDL 34*  --   --   ?LDLCALC 62  --   --   ?CHOLHDL 4.1  --   --   ? < > = values in this interval not displayed.  ?  ?Hematology ?Recent Labs  ?Lab 04/10/22 ?3790 05/04/2022 ?0429 04/12/22 ?2409  ?WBC 8.2 6.2 7.8  ?RBC 3.05* 3.04* 3.12*  ?HGB 8.4* 8.1* 8.8*  ?HCT 26.2* 27.2* 27.8*  ?MCV 85.9 89.5 89.1  ?MCH 27.5 26.6 28.2  ?MCHC 32.1 29.8* 31.7  ?RDW 15.9* 16.2* 16.5*  ?PLT 172 178 210  ? ?Thyroid No results for input(s): TSH, FREET4 in the last 168 hours.  ?BNPNo results for input(s): BNP, PROBNP in the last 168 hours.  ?DDimer No results for input(s): DDIMER in the last 168 hours.  ? ?Radiology  ?  ?MR BRAIN WO CONTRAST ? ?Result Date: 04/10/2022 ?CLINICAL DATA:  Mental status change, unknown cause Concern for anoxic brain injury. EXAM: MRI HEAD WITHOUT CONTRAST TECHNIQUE: Multiplanar, multiecho pulse sequences of the brain and surrounding structures were obtained  without intravenous contrast. COMPARISON:  MRI April 07, 2022. FINDINGS: Brain: Mild restricted diffusion involving bilateral caudates and the bilateral parieto-occipital cortex. Evolving small right cerebellar infarct. There may be slight edema. No significant mass effect. Additional scattered T2/FLAIR hyperintensities in the white matter, nonspecific but compatible with chronic microvascular ischemic disease. Redemonstrated trace intraventricular hemorrhage in the right occipital horn. Similar linear susceptibility artifact along the right temporal convexity, which could represent trace subarachnoid hemorrhage versus thrombus vessel. No hydrocephalus. No midline shift. Vascular: Major arterial flow voids are maintained skull base. Skull and upper cervical spine: Normal marrow signal. Sinuses/Orbits: Paranasal sinus mucosal thickening. No acute orbital findings Other: Bilateral mastoid effusions. IMPRESSION: 1. Mild restricted diffusion involving bilateral caudate and parieto-occipital cortex, concerning for hypoxic/ischemic insult given the clinical history. No significant mass effect. 2. Evolving small right cerebellar infarct. 3. Similar trace intraventricular hemorrhage in the right occipital horn. These results will be called to the ordering clinician or representative by the Radiologist Assistant, and communication documented in the PACS or [employer]. Electronically Signed   By: Margaretha Sheffield M.D.   On: 04/10/2022 15:00  ? ?DG Chest Port 1 View ? ?Result Date: 04/12/2022 ?CLINICAL DATA:  Respiratory failure, ETT history of cardiac arrest. EXAM: PORTABLE CHEST 1 VIEW COMPARISON:  Multiple priors including most recent radiograph April 09, 2022. FINDINGS: Endotracheal tube with tip at the thoracic inlet. Nasogastric tube courses below the diaphragm with tip obscured by collimation. Right IJ CVC with tip overlying the superior cavoatrial junction. The heart size and mediastinal contours are unchanged.  Similar small left pleural effusion and left basilar airspace opacity. Low lung volumes with bibasilar atelectasis.  The visualized skeletal structures are unchanged. IMPRESSION: 1. Similar small left pleural effusion and adjacent airspace opacity. 2. Stable lines and tubes. Electronically Signed   By: Dahlia Bailiff M.D.   On: 04/12/2022 08:26  ? ?EEG adult ? ?Result Date: 04/10/2022 ?Lora Havens, MD     04/10/2022  4:25 PM Patient Name: JERMAL DISMUKE MRN: [id number] Epilepsy Attending: Lora Havens Referring Physician/Provider: Derek Jack, MD Date: 04/10/2022 Duration: 26.30 mins Patient history: 71 year old man status post cardiac arrest secondary to STEMI with EEG 24 hours out from the event with myoclonic status epilepticus that improved clinically on Keppra along with sedation on propofol.  EEG to evaluate for seizure Level of alertness: comatose AEDs during EEG study: LEV, propofol Technical aspects: This EEG study was done with scalp electrodes positioned according to the 10-20 International system of electrode placement. Electrical activity was acquired at a sampling rate of _0  and reviewed with a high frequency filter of _1  and a low frequency filter of _2 . EEG data were recorded continuously and digitally stored. Description: EEG showed generalized periodic discharges (GPDs) at  1.5-_3 , more prominent when awake/stimulated. In between [place], eeg showed generalized eeg suppression which was reactive to stimulation. Hyperventilation and photic stimulation were not performed.   ABNORMALITY - Periodic discharges, generalized ( GPDs) - Background suppression, generalized IMPRESSION: This study showed evidence of epileptogenicity with generalized onset and high potential for seizure recurrence. 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 No seizures were seen throughout the recording. Priyanka Barbra Sarks    ? ?Cardiac Studies  ? ?2D echo 04/08/2022: ?1. Left ventricular ejection fraction, by estimation, is 60 to 65%. The  ?left ventricle has normal function. The left ventricle has no regional  ?wall motion abnormalit

## 2022-04-12 NOTE — Progress Notes (Addendum)
Seizure activity x 5 minutes noted by RN, with spontaneous resolution. EEG event notification button pushed at time of the event.  ? ?LTM EEG reviewed from 11:00 - 11:25 PM. More frequent GPDs are noted during this time interval, but no definite electrographic seizures are appreciated. Prominent muscle artifact is noted at times.  ? ?Increasing propofol gtt to a rate of 70 (currently at 60). Increasing VPA dosing to 750 mg TID from 500 mg BID. Obtaining a VPA level.  ? ?Electronically signed: Dr. Kerney Elbe ? ?

## 2022-04-12 NOTE — Progress Notes (Signed)
Subjective: ?Transferred for LTM EEG, with weaning propofol, began having seizure.  ? ?Exam: ?Vitals:  ? 04/12/22 1502 04/12/22 1600  ?BP:    ?Pulse:    ?Resp:    ?Temp:  99.9 ?F (37.7 ?C)  ?SpO2: 91%   ? ?Gen: In bed, NAD ?Resp: non-labored breathing, no acute distress ?Abd: soft, nt ? ?Neuro:Patient is actively seizing with facial clonic activity.  ?MS: does not open eyes or follow commands ?CN: PERRL, corneals difficult to test due to clonic activity.  ?Motor: does not respond to nox sitm.  ?Sensory: as above.  ? ?Pertinent Labs: ?Cr 1.39 ? 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  Though he has multiple findings concerning for poor prognosis, I do think it is reasonable to try one more attempt at suppressing his seizures given the mild changes on MRI.  If he continues having refractory seizures following this, then I am not certain I would continue progressing to generalized anesthesia.  ? ?Recommendations: ?1) continue propofol overnight ?2) continue Keppra 1g q12h ?3) continue VPA '500mg'$  TID ? ? ?This patient is critically ill and at significant risk of neurological worsening, death and care requires constant monitoring of vital signs, hemodynamics,respiratory and cardiac monitoring, neurological assessment, discussion with family, other specialists and medical decision making of high complexity. I spent 40 minutes of neurocritical care time  in the care of  this patient. This was time spent independent of any time provided by nurse practitioner or PA. ? ?Roland Rack, MD ?[facility] ?[phone] ? ?If 7pm- 7am, please page neurology on call as listed in [place]. ?04/12/2022  7:54 PM ? ? ?

## 2022-04-12 NOTE — Procedures (Addendum)
Patient Name: Jason Neal  ?MRN: [id number]  ?Epilepsy Attending: Lora Havens  ?Referring Physician/Provider: Kerney Elbe, MD ?Duration: 04/10/2022 2353 to 04/12/2022 0730, 04/12/2022 1122- 04/13/2022 0400 ? ?Patient history: 71 year old man status post cardiac arrest secondary to STEMI with EEG 24 hours out from the event with myoclonic status epilepticus that improved clinically on Keppra along with sedation on propofol. EEG to evaluate for seizure. ? ?Level of alertness: comatose ? ?AEDs during EEG study: LEV, propofol, versed ? ?Technical aspects: This EEG study was done with scalp electrodes positioned according to the 10-20 International system of electrode placement. Electrical activity was acquired at a sampling rate of '500Hz'$  and reviewed with a high frequency filter of '70Hz'$  and a low frequency filter of '1Hz'$ . EEG data were recorded continuously and digitally stored.  ? ?Description: EEG initially showed burst suppression pattern with bursts 3 to 5 Hz theta-delta slowing lasting 3 to 7 seconds admixed with generalized periodic spikes at 1 Hz.  There was EEG suppression in between bursts lasting 1 to 5 seconds.  Clinically, patient was noted to have facial and eye twitching concomitant with the bursts. This EEG pattern is consistent with myoclonic status epilepticus.   ? ?As sedation was adjusted, after around 1435 on 04/12/2022, EEG showed intermittent generalized polymorphic sharply contoured 3 to 7 Hz theta-delta slowing lasting 2 to 5 seconds alternating with generalized EEG suppression lasting 2 to 3 seconds.  Gradually after around 1600 on 04/12/2022, EEG started worsening again and showed highly epileptiform generalized bursts lasting 2 to 3 seconds alternating with generalized EEG suppression lasting 2 to 5 seconds. Hyperventilation and photic stimulation were not performed.    ? ?Of note, due to technical difficulties, EEG was not recorded between 04/12/2022 0730 to 1122. ? ?ABNORMALITY ?-Myoclonic status  epilepticus, generalized ?-Burst suppression with highly epileptiform bursts, generalized ? ?IMPRESSION: ?This study initially showed myoclonic status epilepticus.  As medications were adjusted, EEG continued to show evidence of epileptogenicity with generalized onset and high risk for seizure recurrence.  Additionally there is profound diffuse encephalopathy likely due to sedation, anoxic/hypoxic brain injury. ? ?Lora Havens  ? ?

## 2022-04-12 NOTE — Progress Notes (Signed)
Initial Nutrition Assessment ? ?DOCUMENTATION CODES:  ? ?Not applicable ? ?INTERVENTION:  ? ?Initiate tube feeding via OG tube: ?Osmolite 1.5 at 60 ml/h (1440 ml per day) ?Prosource TF 45 ml TID ? ?Provides 2280 kcal, 123 gm protein, 1094 ml free water daily ? ?Propofol providing additional kcal from lipid  ? ?NUTRITION DIAGNOSIS:  ? ?Inadequate oral intake related to inability to eat as evidenced by NPO status. ? ?GOAL:  ? ?Patient will meet greater than or equal to 90% of their needs ? ?MONITOR:  ? ?Vent status, TF tolerance ? ?REASON FOR ASSESSMENT:  ? ?Consult ?Enteral/tube feeding initiation and management ? ?ASSESSMENT:  ? ?Pt with PMH of CAD, Afib, DM, HTN, OSA on CPAP admitted 4/28 after cardiac arrest found to have STEMI after ROSC, tx to [facility] 5/2 for cEEG due to seizures.  ? ?Pt discussed during ICU rounds and with RN.  ?Spoke with wife and daughter who are at bedside. They report that pt had a good appetite PTA with no recent weight changes. Pt enjoys being outdoors and gardening.   ? ?Pt intubated on ventilator support.  ? ?Medications reviewed and include: colace, SSI, novolog, protonix ?Fentanyl  ?Propofol @ 19 ml/hr  provides 501 kcal  ? ?Labs reviewed: Na 147, TG 441 ?A1C: 7.6 ?CBG's: 192-263 ? ?36 F OG tube - per xray tip in distal stomach  ? ? ?NUTRITION - FOCUSED PHYSICAL EXAM: ? ?Flowsheet Row Most Recent Value  ?Orbital Region No depletion  ?Upper Arm Region No depletion  ?Thoracic and Lumbar Region No depletion  ?Buccal Region No depletion  ?Temple Region No depletion  ?Clavicle Bone Region No depletion  ?Clavicle and Acromion Bone Region No depletion  ?Scapular Bone Region No depletion  ?Dorsal Hand No depletion  ?Patellar Region No depletion  ?Anterior Thigh Region No depletion  ?Posterior Calf Region No depletion  ?Edema (RD Assessment) None  ?Hair Reviewed  ?Eyes Reviewed  ?Mouth Reviewed  ?Skin Reviewed  ?Nails Reviewed  ? ?  ? ? ?Diet Order:   ?Diet Order   ? ?       ?  Diet NPO time  specified  Diet effective now       ?  ? ?  ?  ? ?  ? ? ?EDUCATION NEEDS:  ? ?No education needs have been identified at this time ? ?Skin:  Skin Assessment: Reviewed RN Assessment ? ?Last BM:  5/2 type 7 ? ?Height:  ? ?Ht Readings from Last 1 Encounters:  ?04/06/22 '6\' 3"'$  (1.905 m)  ? ? ?Weight:  ? ?Wt Readings from Last 1 Encounters:  ?04/12/22 118.3 kg  ? ? ?Ideal Body Weight:  89 kg  ? ?BMI:  Body mass index is 32.6 kg/m?. ? ?Estimated Nutritional Needs:  ? ?Kcal:  2200-2400 ? ?Protein:  120-140 grams ? ?Fluid:  > 2L /day ? ?Lockie Pares., RD, LDN, CNSC ?See [facility] for contact information  ? ?

## 2022-04-12 NOTE — Progress Notes (Signed)
Performed EEG maintenance.  No skin breakdown observed at electrode sites Fp1, Fp2. ?

## 2022-04-12 NOTE — Progress Notes (Signed)
eLink Physician-Brief Progress Note ?Patient Name: AUDRY PECINA ?DOB: 08/18/1951 ?MRN: [id number] ? ? ?Date of Service ? 04/12/2022  ?HPI/Events of Note ? PT with persistent hyperglycemia  ?eICU Interventions ? Increased sliding scale insulin to high dose/resistant scale q4h. Will continue to monitor glucose trends and adjust insulin accordingly.   ? ? ? ?  ? ?[place] ?04/12/2022, 8:38 PM ?

## 2022-04-12 NOTE — IPAL (Signed)
?  Interdisciplinary Goals of Care Family Meeting ? ? ?Date carried out: 04/12/2022 ? ?Location of the meeting: Bedside ? ?Member's involved: Physician, Bedside Registered Nurse, Family Member or next of kin, and Other: resident physician ? ?Durable Power of Tour manager: Wife   ? ?Discussion: We discussed goals of care for Jason Neal .  We discussed that he has multiple studies showing evidence of brain damage from his cardiac arrest. We cannot know how extensive this injury will be until we have the opportunity to wake him up. We will use to EEG to help guide [place] with weaning sedation to ensure seizures are controlled. He may still not be able to wake up or regain his previous quality of life. They understand and want to give him the best opportunity to have a good outcome. Neurology will continue to help guide his care. ? ?Code status: Full Code ? ?Disposition: Continue current acute care ? ? ?Time spent for the meeting: 20 min ? ?Julian Hy ?04/12/2022, 3:35 PM ? ?

## 2022-04-12 NOTE — Progress Notes (Signed)
[employer] [employer] ADULT ICU REPLACEMENT PROTOCOL ? ? ?The patient does apply for the [facility] Adult ICU Electrolyte Replacment Protocol based on the criteria listed below:  ? ?1.Exclusion criteria: TCTS patients, ECMO patients, and Dialysis patients ?2. Is GFR >/= 30 ml/min? Yes.    ?Patient's GFR today is 54 ?3. Is SCr </= 2? Yes.   ?Patient's SCr is 1.39 mg/dL ?4. Did SCr increase >/= 0.5 in 24 hours? No. ?5.Pt's weight >40kg  Yes.   ?6. Abnormal electrolyte(s): K+ 3.7  ?7. Electrolytes replaced per protocol ?8.  Call MD STAT for K+ </= 2.5, Phos </= 1, or Mag </= 1 ?Physician:  n/a ? ?Jason Neal 04/12/2022 6:48 AM ? ?

## 2022-04-12 NOTE — Progress Notes (Incomplete)
RN arrived in Pt's bedside Pt appeared to be seizing lasting about 1 minute. Continuous EEG monitor button pressed to record event. CCM resident notified. Neurology MD notified and rounded at bedside. Verbal order for '2mg'$  ativan and to restart propofol.  ?

## 2022-04-12 NOTE — Progress Notes (Signed)
? ?NAME:  Jason Neal, MRN:  [id number], DOB:  1951/11/15, LOS: 1 ?ADMISSION DATE:  05/05/2022, CONSULTATION DATE:  04/18/2022 ?REFERRING MD:  Dr. Patsey Berthold CHIEF COMPLAINT:  Anoxic brain injury  ? ?History of Present Illness:  ?71 year old male who has a history of coronary artery disease with a prior PCI to left circumflex and RCA he also has a past medical history of paroxysmal atrial fibrillation, diabetes mellitus, hypertension chronic kidney disease OSA on CPAP.  He presented to [facility] 4/28 with cardiac arrest was found to have a lateral STEMI for which he was taken to the cath lab and had stent placement. Since admit, MRI shows changes consistent with anoxic brain damage and after discussion with neurology, they recommended transferred to [facility] intensive care unit 5/2 for cEEG and further evaluation by neurological services. ?  ? ?Past Medical History:  ?CAD s/p PCI to left circumflex and RCA ?PAF ?DM ?HTN ?CKD ?OSA on CPAP  ? ?Significant Hospital Events:  ?4/28: Admitted to the ICU with acute ST elevation MI of the lateral wall s/p cath ?4/30: WUA performed myoclonus improved pt unable to follow commands; no response to BUE noxious stimuli; withdraws from pain BLE; Sedation restarted due to vent dyssynchrony, increased work of breathing and agitation/delirium.  Attempted to transition to precedex gtt and prn fentanyl with scheduled oxycodone.  However, pt did not tolerate propofol and fentanyl gtts restarted                ?5/1: MRI Brain and EEG concerning for anoxic injury ?5/2: Persistent rhythmic fasciculations of right mouth/lip concerning for myoclonus vs seizures.  Neurology recommends transfer to [facility].   ? ? ?Significant Diagnostic Tests:  ?4/28: Cardiac Cath>>Prox Cx to Mid Cx lesion is 90% stenosed ?Proximal to previously placed stent A drug-eluting stent was ?successfully placed (overlapping previously placed stent) using a STENT ONYX FRONTIER 2.25X26.  Postdilated to 2.6 mm Post  intervention, there is a 0% residual stenosis. Previously placed Mid Cx to Dist Cx stent (drug-eluting stent) is  widely patent. Mid LAD lesion is 35% stenosed. 1st Diag lesion is 55% stenosed.  2nd Diag lesion is 70% stenosed. Previously placed Prox RCA stent (drug- ?eluting stent) is  widely patent. Mid RCA to Dist RCA lesion is 35% stenosed. ?4/28: MRI Brain>>Punctate acute/subacute infarct in the right cerebellar hemisphere. Trace intraventricular hemorrhage in the occipital horn of the right lateral ventricle. No evidence of global anoxic injury. Repeat study within 2-4 days may be obtained to exclude delayed injury. ?4/29: Echo>>EF estimated 60 to 65%  ?4/29: EEG>>Initial burst suppression pattern consistent with global cerebral dysfunction, medication effect, or both. After propofol was turned off, patient developed generalized periodic discharges at 1 Hz over a background of diffuse suppression timelocked to full body myoclonus, consistent with myoclonic status epilepticus. ?  ? ?Interim History / Subjective:  ?Overnight transferred from [facility] to [facility]. Comfortable/sedated on propofol and fentanyl.  ? ?Objective   ?Blood pressure 132/61, pulse 61, temperature (!) 101 ?F (38.3 ?C), temperature source Axillary, resp. rate 20, weight 118.3 kg, SpO2 93 %. ?   ?Vent Mode: PRVC ?FiO2 (%):  [30 %] 30 % ?Set Rate:  [20 bmp] 20 bmp ?Vt Set:  [520 mL] 520 mL ?PEEP:  [5 cmH20] 5 cmH20 ?Plateau Pressure:  [18 cmH20-23 cmH20] 18 cmH20  ? ?Intake/Output Summary (Last 24 hours) at 04/12/2022 0944 ?Last data filed at 04/12/2022 0750 ?Gross per 24 hour  ?Intake 1428.32 ml  ?Output 150 ml  ?Net  1278.32 ml  ? ?Filed Weights  ? 04/12/22 0500  ?Weight: 118.3 kg  ? ? ?Examination: ?General: intubated, sedated, NAD, intermittent twitching ?HENT: Corneal and pupillary reflexes intact, tracheal reflex intact ?Lungs: Mechanically intubated, breathing unlabored, clear to auscultation bilaterally ?Cardiovascular: RRR, no murmurs rubs or  gallops ?Abdomen: Soft, bowel sounds present ?Extremities: No lower extremity edema ?Neuro: Sedated, withdraws to painful stimuli of lower extremities, no response to painful stimuli of upper extremities ?Skin: Warm, dry ? ?Resolved Hospital Problem list   ?Myoclonic status epilepticus  ? ?Assessment & Plan:  ? ?Ventilator dependent respiratory failure 2/2 cardiac arrest due to STEMI ?Anoxic brain injury ?Trace hemorrhage of right occipital horn ?Small evolving right cerebellar infarct  ?Possible aspiration pneumonia ?Patient presented s/p cardiac arrest found to have a lateral STEMI. Initial rhythm was PEA and she received 3 rounds of epinephrine per ACLS for a total of 6 minutes. Was subsequently converted to a shockable rhythm and defibrillated once, ROSC was obtained. He was then found to have a STEMI and urgently taken to the cath lab. Initial MRI did not show anoxic brain injury however subsequent MRI showed changes consistent with anoxic brain injury. Of note, he also developed myoclonic status epilepticus. Neurology consulted at [facility] for prognostication; prognosis of meaningful recovery remains guarded. Transferred to [facility] for LTM EEG. Myoclonus has recurred with reductions of propofol, however unclear if this is myoclonus versus true seizure activity.  Plan to wean off of sedation today and see how patient responds neurologically. ?-Neurology following, appreciate assistance  ?-On Keppra ?-Wean fentanyl and propofol  ?-LTM EEG  ?-VAP protocol ?-Continue full ventilatory support  ?-Continue Unasyn for aspiration pneumonia ? ?PEA arrest ?Lateral STEMI ?PAF ?Patient presented s/p cardiac arrest due to STEMI. After ROSC achieved was taken to the cath lab s/p LHC with stent placement to LCx on 4/28.  ?-Cardiology consulted, appreciate assitance  ?-Continue plavix, ASA and statin ?-Home hypertensive's held  ? ?AKI ?Cr 1.39, improved since admission. Likely from contrast in the setting of LHC. ?-Trend BMP ?-Avoid  nephrotoxic agents  ? ?Normocytic Anemia ?Hgb 8.8, stable.  ?-Hold xarelto ?-Trend CBC ? ?DM ?-SSI  ? ? ?Best practice (evaluated daily)  ?Diet: tube feeds ?Pain/Anxiety/Delirium protocol (if indicated): propofol, fentanyl ?VAP protocol (if indicated): VAP bundle ?DVT prophylaxis: LMWH ?GI prophylaxis: PPI ?Glucose control: SSI ?Mobility: bedrest ?Disposition:ICU ? ?Goals of Care:  ?Last date of multidisciplinary goals of care discussion:5/3 ?Family and staff present: yes ?Summary of discussion: wean off sedation today to see how patient responds ?Follow up goals of care discussion due: 5/4 ?Code Status: Full ? ?Labs   ?CBC: ?Recent Labs  ?Lab 04/06/22 ?2359 04/07/22 ?0423 04/08/22 ?0434 04/09/22 ?0431 04/09/22 ?1830 04/10/22 ?5400 04/13/2022 ?0429 04/12/22 ?8676  ?WBC 12.7*   < > 11.0* 9.1  --  8.2 6.2 7.8  ?NEUTROABS 6.5  --   --  6.9  --  5.7 3.9  --   ?HGB 12.1*   < > 9.2* 7.7* 8.7* 8.4* 8.1* 8.8*  ?HCT 38.8*   < > 28.9* 24.2* 27.2* 26.2* 27.2* 27.8*  ?MCV 87.4   < > 85.5 85.8  --  85.9 89.5 89.1  ?PLT 197   < > 209 150  --  172 178 210  ? < > = values in this interval not displayed.  ? ? ?Basic Metabolic Panel: ?Recent Labs  ?Lab 04/08/22 ?0434 04/09/22 ?0431 04/09/22 ?1830 04/10/22 ?1950 04/10/22 ?1621 05/05/2022 ?0429 04/12/22 ?9326  ?NA 138 140  --  144  --  144 147*  ?K 3.8 3.7  --  3.4*  --  3.4* 3.7  ?CL 108 112*  --  114*  --  112* 117*  ?CO2 21* 23  --  23  --  22 20*  ?GLUCOSE 193* 184*  --  170*  --  186* 184*  ?BUN 38* 32*  --  28*  --  33* 29*  ?CREATININE 2.32* 1.83*  --  1.53*  --  1.42* 1.39*  ?CALCIUM 7.9* 7.7*  --  7.5*  --  7.4* 8.2*  ?MG 2.2 2.1 2.0 2.2 2.3 2.1 2.2  ?PHOS 3.7 2.4* 2.9 2.4* 3.1 3.4 3.0  ? ?GFR: ?Estimated Creatinine Clearance: 66.6 mL/min (A) (by C-G formula based on SCr of 1.39 mg/dL (H)). ?Recent Labs  ?Lab 04/07/22 ?1100 04/08/22 ?0434 04/09/22 ?0431 04/10/22 ?7628 04/30/2022 ?0429 04/12/22 ?3151  ?WBC  --    < > 9.1 8.2 6.2 7.8  ?LATICACIDVEN 2.2*  --   --   --   --   --   ? < >  = values in this interval not displayed.  ? ? ?Liver Function Tests: ?Recent Labs  ?Lab 04/06/22 ?2359 04/07/22 ?1102 04/08/22 ?0434 04/09/22 ?0431 04/10/22 ?0337  ?AST 218* 362* 225* 115* 80*  ?ALT 184* 189* 137* 85*

## 2022-04-12 NOTE — Progress Notes (Signed)
Sudden increase of SBP to 180s and rhythmic twitching of left foot, button pressed on the EEG monitor and Dr. Cheral Marker notified, propofol increased to 60 per Dr. Cheral Marker ? ?

## 2022-04-12 NOTE — Progress Notes (Signed)
LTM restarted, it did not resume recording at 0730 automatically ?

## 2022-04-12 NOTE — Progress Notes (Signed)
Pharmacy Antibiotic Note ? ?Jason Neal is a 71 y.o. male admitted on 05/01/2022 with pneumonia.  Pharmacy has been consulted for zosyn dosing. ? ?Pt has been on D6 of Unasyn for his PNA. Wbc wnl. He spike a fever today. Abx expanded to zosyn. Poor prognosis.  ? ?Plan: ?Zosyn 3.375g IV q8 ?F/u LOT ? ?Weight: 118.3 kg (260 lb 12.9 oz) ? ?Temp (24hrs), Avg:100.3 ?F (37.9 ?C), Min:99.1 ?F (37.3 ?C), Max:102.1 ?F (38.9 ?C) ? ?Recent Labs  ?Lab 04/07/22 ?1100 04/07/22 ?1102 04/08/22 ?0434 04/09/22 ?0431 04/10/22 ?6712 04/18/2022 ?0429 04/12/22 ?4580  ?WBC  --   --  11.0* 9.1 8.2 6.2 7.8  ?CREATININE  --    < > 2.32* 1.83* 1.53* 1.42* 1.39*  ?LATICACIDVEN 2.2*  --   --   --   --   --   --   ? < > = values in this interval not displayed.  ?  ?Estimated Creatinine Clearance: 66.6 mL/min (A) (by C-G formula based on SCr of 1.39 mg/dL (H)).   ? ?Allergies  ?Allergen Reactions  ? Angiotensin Receptor Blockers Cough  ?  (06/18/19 pt denies)  ? Demeclocycline Other (See Comments)  ?  AS ACHILD  ? ? ?Antimicrobials this admission: ?4/28 unasyn>>5/3 ? ?Dose adjustments this admission: ? ? ?Microbiology results: ?MRSA>>pending ? ? ?Onnie Boer, PharmD, BCIDP, AAHIVP, CPP ?Infectious Disease Pharmacist ?04/12/2022 4:16 PM ? ? ? ?

## 2022-04-12 NOTE — Progress Notes (Signed)
ETT advanced 3cm per MD order. ETT now placed 27'@lip'$ . ?

## 2022-04-13 ENCOUNTER — Inpatient Hospital Stay (HOSPITAL_COMMUNITY): Payer: Medicare Other

## 2022-04-13 DIAGNOSIS — I213 ST elevation (STEMI) myocardial infarction of unspecified site: Secondary | ICD-10-CM | POA: Diagnosis not present

## 2022-04-13 DIAGNOSIS — G931 Anoxic brain damage, not elsewhere classified: Secondary | ICD-10-CM | POA: Diagnosis not present

## 2022-04-13 DIAGNOSIS — N179 Acute kidney failure, unspecified: Secondary | ICD-10-CM | POA: Diagnosis not present

## 2022-04-13 DIAGNOSIS — Z9911 Dependence on respirator [ventilator] status: Secondary | ICD-10-CM | POA: Diagnosis not present

## 2022-04-13 LAB — COMPREHENSIVE METABOLIC PANEL
ALT: 29 U/L (ref 0–44)
AST: 42 U/L — ABNORMAL HIGH (ref 15–41)
Albumin: 2.3 g/dL — ABNORMAL LOW (ref 3.5–5.0)
Alkaline Phosphatase: 29 U/L — ABNORMAL LOW (ref 38–126)
Anion gap: 10 (ref 5–15)
BUN: 32 mg/dL — ABNORMAL HIGH (ref 8–23)
CO2: 21 mmol/L — ABNORMAL LOW (ref 22–32)
Calcium: 8.1 mg/dL — ABNORMAL LOW (ref 8.9–10.3)
Chloride: 113 mmol/L — ABNORMAL HIGH (ref 98–111)
Creatinine, Ser: 1.69 mg/dL — ABNORMAL HIGH (ref 0.61–1.24)
GFR, Estimated: 43 mL/min — ABNORMAL LOW (ref >60)
Glucose, Bld: 362 mg/dL — ABNORMAL HIGH (ref 70–99)
Potassium: 3.6 mmol/L (ref 3.5–5.1)
Sodium: 144 mmol/L (ref 135–145)
Total Bilirubin: 0.6 mg/dL (ref 0.3–1.2)
Total Protein: 5.6 g/dL — ABNORMAL LOW (ref 6.5–8.1)

## 2022-04-13 LAB — CBC WITH DIFFERENTIAL/PLATELET
Abs Immature Granulocytes: 0.09 10*3/uL — ABNORMAL HIGH (ref 0.00–0.07)
Basophils Absolute: 0 10*3/uL (ref 0.0–0.1)
Basophils Relative: 1 %
Eosinophils Absolute: 0.5 10*3/uL (ref 0.0–0.5)
Eosinophils Relative: 6 %
HCT: 26.5 % — ABNORMAL LOW (ref 39.0–52.0)
Hemoglobin: 8.6 g/dL — ABNORMAL LOW (ref 13.0–17.0)
Immature Granulocytes: 1 %
Lymphocytes Relative: 18 %
Lymphs Abs: 1.3 10*3/uL (ref 0.7–4.0)
MCH: 28.7 pg (ref 26.0–34.0)
MCHC: 32.5 g/dL (ref 30.0–36.0)
MCV: 88.3 fL (ref 80.0–100.0)
Monocytes Absolute: 0.6 10*3/uL (ref 0.1–1.0)
Monocytes Relative: 9 %
Neutro Abs: 4.5 10*3/uL (ref 1.7–7.7)
Neutrophils Relative %: 65 %
Platelets: 227 10*3/uL (ref 150–400)
RBC: 3 MIL/uL — ABNORMAL LOW (ref 4.22–5.81)
RDW: 16.5 % — ABNORMAL HIGH (ref 11.5–15.5)
WBC: 7 10*3/uL (ref 4.0–10.5)
nRBC: 0.6 % — ABNORMAL HIGH (ref 0.0–0.2)

## 2022-04-13 LAB — GLUCOSE, CAPILLARY
Glucose-Capillary: 356 mg/dL — ABNORMAL HIGH (ref 70–99)
Glucose-Capillary: 369 mg/dL — ABNORMAL HIGH (ref 70–99)
Glucose-Capillary: 309 mg/dL — ABNORMAL HIGH (ref 70–99)
Glucose-Capillary: 265 mg/dL — ABNORMAL HIGH (ref 70–99)
Glucose-Capillary: 284 mg/dL — ABNORMAL HIGH (ref 70–99)
Glucose-Capillary: 289 mg/dL — ABNORMAL HIGH (ref 70–99)
Glucose-Capillary: 296 mg/dL — ABNORMAL HIGH (ref 70–99)
Glucose-Capillary: 291 mg/dL — ABNORMAL HIGH (ref 70–99)
Glucose-Capillary: 265 mg/dL — ABNORMAL HIGH (ref 70–99)
Glucose-Capillary: 275 mg/dL — ABNORMAL HIGH (ref 70–99)
Glucose-Capillary: 259 mg/dL — ABNORMAL HIGH (ref 70–99)
Glucose-Capillary: 258 mg/dL — ABNORMAL HIGH (ref 70–99)

## 2022-04-13 LAB — MAGNESIUM: Magnesium: 2.1 mg/dL (ref 1.7–2.4)

## 2022-04-13 LAB — TRIGLYCERIDES: Triglycerides: 630 mg/dL — ABNORMAL HIGH (ref <150)

## 2022-04-13 LAB — VALPROIC ACID LEVEL
Valproic Acid Lvl: 52 ug/mL (ref 50.0–100.0)
Valproic Acid Lvl: 38 ug/mL — ABNORMAL LOW (ref 50.0–100.0)

## 2022-04-13 LAB — PHOSPHORUS: Phosphorus: 3.1 mg/dL (ref 2.5–4.6)

## 2022-04-13 IMAGING — DX DG CHEST 1V PORT
1 series · 1 of 1 positions shown · non-contrast
Comparison: [DATE]

CLINICAL DATA: Fever, ETT placement

EXAM:
PORTABLE CHEST 1 VIEW

[chest]
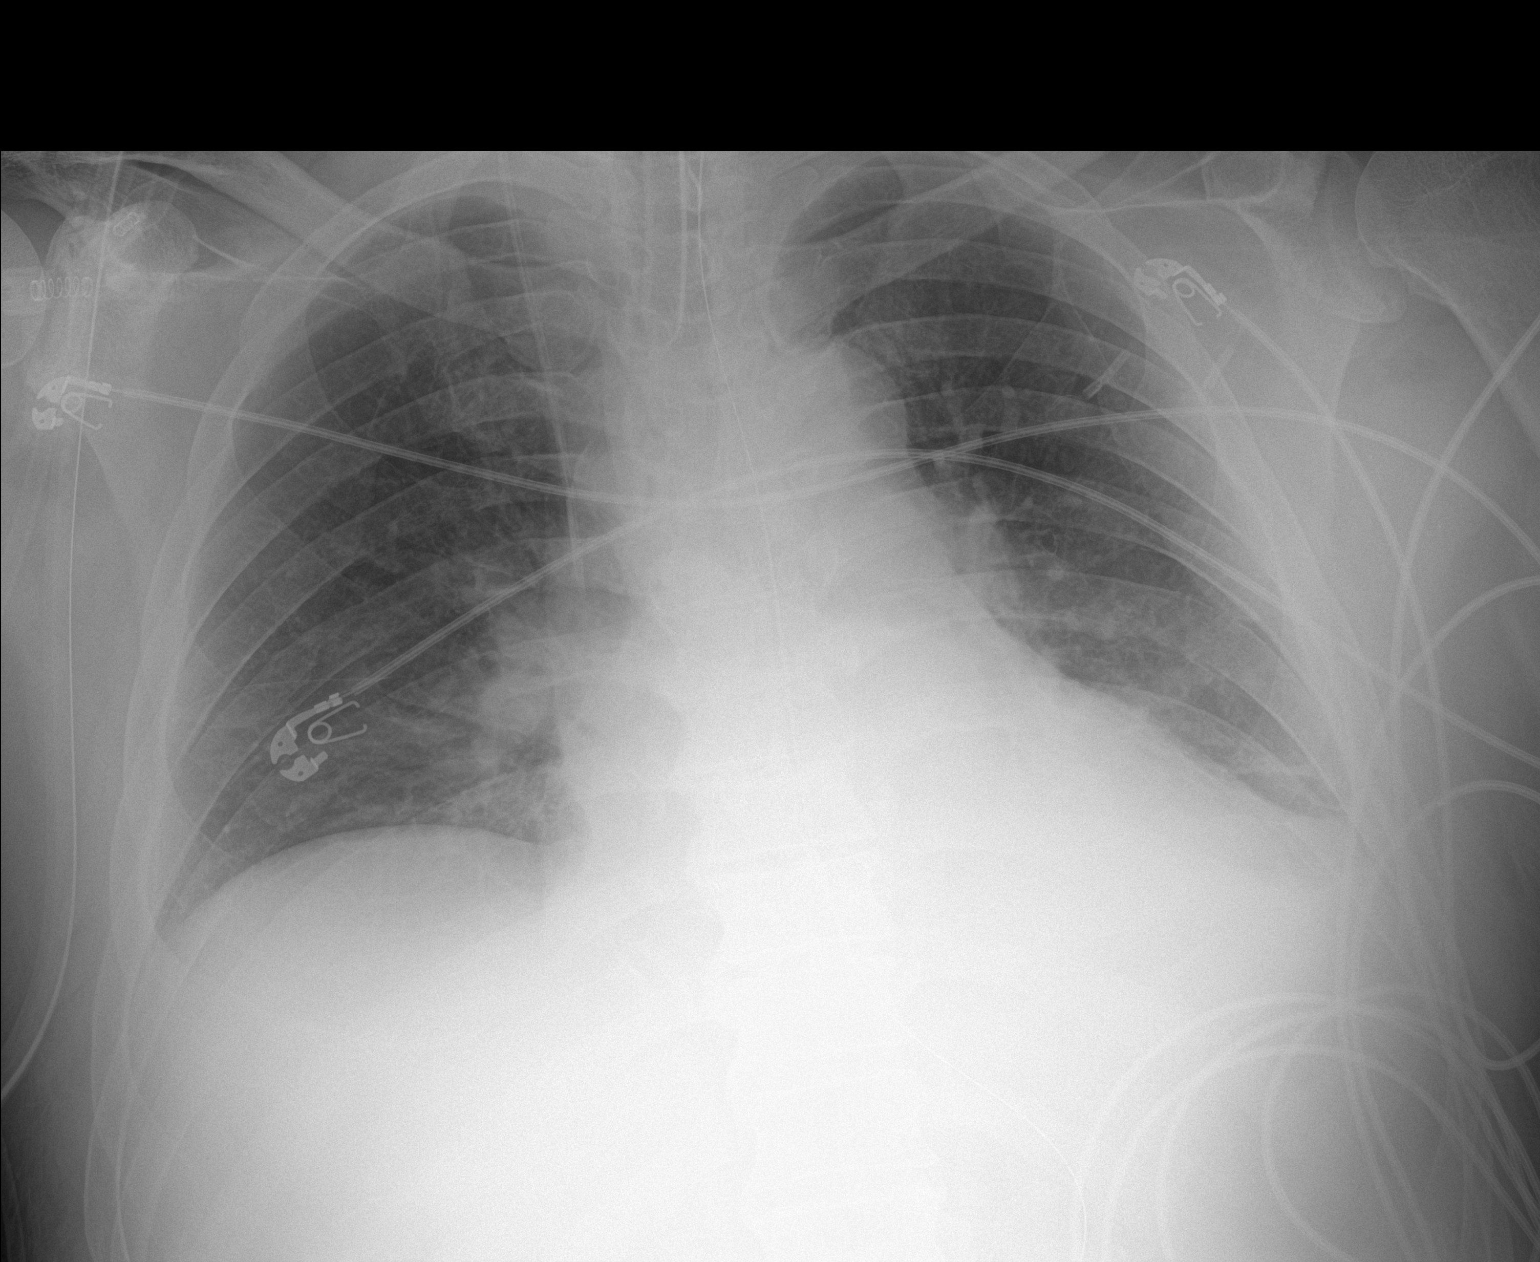

[1 of 1 positions shown; findings below may reference images not displayed]

FINDINGS: I

Endotracheal tube with the tip 5.7 above the carina. Nasogastric
tube coursing below the diaphragm. Right jugular central venous
catheter with the tip projecting over the SVC.

Bilateral diffuse interstitial and alveolar airspace opacities. Mild
bibasilar atelectasis likely reflecting atelectasis. Trace left
pleural effusion. No right pleural effusion. No pneumothorax. Stable
cardiomegaly.

No acute osseous abnormality.
IMPRESSION: 1. Support lines and tubing in satisfactory position.
2. Trace left pleural effusion. Bibasilar airspace disease likely
reflecting atelectasis.

## 2022-04-13 MED ORDER — POTASSIUM CHLORIDE 20 MEQ PO PACK
40.0000 meq | PACK | ORAL | Status: AC
  Administered 2022-04-13 (×2): 40 meq
  Filled 2022-04-13 (×2): qty 2

## 2022-04-13 MED ORDER — METHYLPREDNISOLONE SODIUM SUCC 40 MG IJ SOLR
40.0000 mg | Freq: Once | INTRAMUSCULAR | Status: AC
  Administered 2022-04-13: 40 mg via INTRAVENOUS
  Filled 2022-04-13: qty 1

## 2022-04-13 MED ORDER — FENTANYL 2500MCG IN NS 250ML (10MCG/ML) PREMIX INFUSION: 0.0000 ug/h | INTRAVENOUS | Status: DC

## 2022-04-13 MED ORDER — GLYCOPYRROLATE 0.2 MG/ML IJ SOLN
0.2000 mg | INTRAMUSCULAR | Status: DC | PRN
  Administered 2022-04-13 – 2022-04-16 (×8): 0.2 mg via INTRAVENOUS
  Filled 2022-04-13 (×8): qty 1

## 2022-04-13 MED ORDER — GLYCOPYRROLATE 0.2 MG/ML IJ SOLN: 0.2000 mg | INTRAMUSCULAR | Status: DC | PRN

## 2022-04-13 MED ORDER — MIDAZOLAM-SODIUM CHLORIDE 100-0.9 MG/100ML-% IV SOLN
10.0000 mg/h | INTRAVENOUS | Status: DC
  Administered 2022-04-13: 0.5 mg/h via INTRAVENOUS
  Administered 2022-04-14: 6 mg/h via INTRAVENOUS
  Administered 2022-04-15 (×2): 10 mg/h via INTRAVENOUS
  Administered 2022-04-15: 8 mg/h via INTRAVENOUS
  Administered 2022-04-16: 10 mg/h via INTRAVENOUS
  Filled 2022-04-13 (×6): qty 100

## 2022-04-13 MED ORDER — ONDANSETRON HCL 4 MG PO TABS: 4.0000 mg | ORAL_TABLET | Freq: Four times a day (QID) | ORAL | Status: DC | PRN

## 2022-04-13 MED ORDER — DEXTROSE 50 % IV SOLN: 0.0000 mL | INTRAVENOUS | Status: DC | PRN

## 2022-04-13 MED ORDER — ONDANSETRON HCL 4 MG/2ML IJ SOLN: 4.0000 mg | Freq: Four times a day (QID) | INTRAMUSCULAR | Status: DC | PRN

## 2022-04-13 MED ORDER — FUROSEMIDE 10 MG/ML IJ SOLN
80.0000 mg | Freq: Once | INTRAMUSCULAR | Status: AC
  Administered 2022-04-13: 80 mg via INTRAVENOUS
  Filled 2022-04-13: qty 8

## 2022-04-13 MED ORDER — GLYCOPYRROLATE 1 MG PO TABS: 1.0000 mg | ORAL_TABLET | ORAL | Status: DC | PRN

## 2022-04-13 MED ORDER — POLYVINYL ALCOHOL 1.4 % OP SOLN
1.0000 [drp] | Freq: Four times a day (QID) | OPHTHALMIC | Status: DC | PRN
  Filled 2022-04-13: qty 15

## 2022-04-13 MED ORDER — FENTANYL CITRATE PF 50 MCG/ML IJ SOSY
50.0000 ug | PREFILLED_SYRINGE | INTRAMUSCULAR | Status: DC | PRN
  Administered 2022-04-14: 50 ug via INTRAVENOUS
  Filled 2022-04-13: qty 1

## 2022-04-13 MED ORDER — INSULIN REGULAR(HUMAN) IN NACL 100-0.9 UT/100ML-% IV SOLN
INTRAVENOUS | Status: DC
  Administered 2022-04-13 (×2): 30 [IU]/h via INTRAVENOUS
  Administered 2022-04-13: 13 [IU]/h via INTRAVENOUS
  Filled 2022-04-13 (×3): qty 100

## 2022-04-13 MED ORDER — LORAZEPAM 2 MG/ML IJ SOLN
2.0000 mg | INTRAMUSCULAR | Status: AC | PRN
  Administered 2022-04-13 – 2022-04-14 (×3): 2 mg via INTRAVENOUS
  Filled 2022-04-13 (×3): qty 1

## 2022-04-13 MED ORDER — FENTANYL BOLUS VIA INFUSION
100.0000 ug | INTRAVENOUS | Status: DC | PRN
  Filled 2022-04-13: qty 100

## 2022-04-13 MED ORDER — DIPHENHYDRAMINE HCL 50 MG/ML IJ SOLN: 25.0000 mg | INTRAMUSCULAR | Status: DC | PRN

## 2022-04-13 NOTE — Progress Notes (Signed)
LTM EEG discontinued - no skin breakdown at unhook.   

## 2022-04-13 NOTE — Progress Notes (Signed)
RT NOTE: ? ?CPT held at this time. Family surrounding bed. Pt is planned one-way extubation in the AM ?

## 2022-04-13 NOTE — Progress Notes (Signed)
Subjective: ?Following seizure yesterday afternoon, increased propofol which for time did suppress his GPD's, but they returned when he was changed back to midazolam due to elevated triglycerides. ? ?Exam: ?Vitals:  ? 04/13/22 1300 04/13/22 1400  ?BP: (!) 115/52 (!) 121/54  ?Pulse: 61 63  ?Resp: 20 20  ?Temp:    ?SpO2: 95% 96%  ? ?Gen: In bed, NAD ?Resp: non-labored breathing, no acute distress ?Abd: soft, nt ? ?Neuro:Patient is actively seizing with facial clonic activity.  ?MS: does not open eyes or follow commands ?CN: PERRL, corneals difficult to test due to clonic activity.  ?Motor: does not respond to nox sitm.  ?Sensory: as above.  ? ?Pertinent Labs: ?Cr 1.69 ? 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  I feel that he has poor prognosis based on GPDs on EEG, early myoclonic status, lack of improvement over the course of the past 5 days, evidence of anoxic injury on MRI.  With his seizures resuming, I think that further aggressive suppression is unlikely to give him any meaningful benefit.  At this point, I think that the likelihood of improving to independent status is exceedingly low.  I discussed this with the family who expressed that he would not want to be kept alive with a trach/PEG without a good chance of improvement back to some type of meaningful recovery. ? ?Given that, the wife and daughter suspect the patient would not want to continue aggressive therapy, and plan to change to comfort care.   ? ?Recommendations: ?1) continue midazolam ?2) continue Keppra 1g q12h ?3) continue VPA '500mg'$  TID ?4) palliative care discussions. ? ?This patient is critically ill and at significant risk of neurological worsening, death and care requires constant monitoring of vital signs, hemodynamics,respiratory and cardiac monitoring, neurological assessment, discussion with family, other specialists and medical decision making of high complexity. I spent 43 minutes of neurocritical care time   in the care of  this patient. This was time spent independent of any time provided by nurse practitioner or PA. ? ?Roland Rack, MD ?[facility] ?[phone] ? ?If 7pm- 7am, please page neurology on call as listed in [place]. ?04/13/2022  3:45 PM ? ? ?

## 2022-04-13 NOTE — Progress Notes (Signed)
EEG maintenance performed.  No skin breakdown observed at electrode sites Fp1, Fp2. 

## 2022-04-13 NOTE — Progress Notes (Signed)
Extubation Procedure Note ? ?Patient Details:   ?Name: Jason Neal ?DOB: Feb 06, 1951 ?MRN: [id number] ?  ?Airway Documentation:  ?  ?Vent end date: (not recorded) Vent end time: (not recorded)  ? ?Evaluation ? O2 sats:  not on monitor ?Complications: No apparent complications ?Patient not on monitor tolerate procedure well. ?Bilateral Breath Sounds: Rhonchi ?  ?No ? ?[place] ?04/13/2022, 11:13 PM ?PT. Extubated per family request. To [place] 4L ? ?

## 2022-04-13 NOTE — Procedures (Addendum)
Patient Name: Jason Neal  ?MRN: [id number]  ?Epilepsy Attending: Lora Havens  ?Referring Physician/Provider: Kerney Elbe, MD ?Duration:  04/13/2022 0400 to 04/13/2022 1744 ?  ?Patient history: 71 year old man status post cardiac arrest secondary to STEMI with EEG 24 hours out from the event with myoclonic status epilepticus that improved clinically on Keppra along with sedation on propofol. EEG to evaluate for seizure. ?  ?Level of alertness: comatose ?  ?AEDs during EEG study: LEV, VPA, propofol, versed ?  ?Technical aspects: This EEG study was done with scalp electrodes positioned according to the 10-20 International system of electrode placement. Electrical activity was acquired at a sampling rate of '500Hz'$  and reviewed with a high frequency filter of '70Hz'$  and a low frequency filter of '1Hz'$ . EEG data were recorded continuously and digitally stored.  ?  ?Description: EEG initially showed highly epileptiform generalized bursts lasting 2 to 3 seconds alternating with generalized EEG suppression lasting 2 to 3 seconds. Hyperventilation and photic stimulation were not performed.    ? ?Patient event button was pressed on 04/13/2022 at 0737.  Per RN, patient was noted to have facial twitching, as well as twitching of shoulders and toes which was difficult to visualize on camera. Concomitant EEG showed generalized epileptiform bursts consistent with myoclonic seizures.   ?  ?ABNORMALITY ?-Myoclonic seizures, generalized ?-Burst suppression with highly epileptiform bursts, generalized ?  ?IMPRESSION: ?This study showed evidence of epileptogenicity with generalized onset and high risk for seizure recurrence.  Additionally there is profound diffuse encephalopathy likely due to sedation, anoxic/hypoxic brain injury. ? ?Patient event button was pressed on 04/13/2022 at 0737 for twitching noted on face, shoulders and toes per RN (difficult to visualize on camera).  Concomitant EEG showed generalized epileptiform bursts  consistent with myoclonic seizures. ? ?This EEG pattern is usually suggestive of significant anoxic/hypoxic brain injury with poor neurologic recovery.  Clinical correlation is recommended. ?  ?Lora Havens  ?

## 2022-04-13 NOTE — IPAL (Signed)
?  Interdisciplinary Goals of Care Family Meeting ? ? ?Date carried out:: 04/13/2022 ? ?Location of the meeting: Bedside ? ?Member's involved: Physician, Bedside Registered Nurse, and Family Member or next of kin wife, daughter, patient's sister ? ?Durable Power of Tour manager: wife   ? ?Discussion: We discussed goals of care for Jason Neal .  They have reviewed his prognosis with neurology earlier in the day and understand that he is unlikely to have a meaningful neurologic outcome.  They wish to proceed with focusing on his comfort.  There is multiple family members that are coming to visit tonight.  They will plan to withdraw from mechanical ventilation and aggressive support either tonight or tomorrow morning.  We discussed that there are medications that we will need to use to help control seizures and prevent discomfort.  They want to optimize comfort and do not want him to struggle or suffer after extubation.  We ensured that this is possible.  Given that he has seized when taken off propofol twice, it is reasonable to continue this medication to help control seizures after extubation.  We discussed CODE STATUS and they would like to change to DNR CODE STATUS. ? ?Code status: Full DNR ? ?Disposition: In-patient comfort care ? ? ?Time spent for the meeting: 10 min. ? ?Jason Neal ?04/13/2022, 6:42 PM ? ?

## 2022-04-13 NOTE — Progress Notes (Signed)
? ?NAME:  LYDEN REDNER, MRN:  [id number], DOB:  03-12-1951, LOS: 2 ?ADMISSION DATE:  05/01/2022, CONSULTATION DATE:  04/14/2022 ?REFERRING MD:  Dr. Patsey Berthold CHIEF COMPLAINT:  Anoxic brain injury  ? ?History of Present Illness:  ?71 year old male who has a history of coronary artery disease with a prior PCI to left circumflex and RCA he also has a past medical history of paroxysmal atrial fibrillation, diabetes mellitus, hypertension chronic kidney disease OSA on CPAP.  He presented to [facility] 4/28 with cardiac arrest was found to have a lateral STEMI for which he was taken to the cath lab and had stent placement. Since admit, MRI shows changes consistent with anoxic brain damage and after discussion with neurology, they recommended transferred to [facility] intensive care unit 5/2 for cEEG and further evaluation by neurological services. ?  ? ?Past Medical History:  ?CAD s/p PCI to left circumflex and RCA ?PAF ?DM ?HTN ?CKD ?OSA on CPAP  ? ?Significant Hospital Events:  ?4/28: Admitted to the ICU with acute ST elevation MI of the lateral wall s/p cath ?4/30: WUA performed myoclonus improved pt unable to follow commands; no response to BUE noxious stimuli; withdraws from pain BLE; Sedation restarted due to vent dyssynchrony, increased work of breathing and agitation/delirium.  Attempted to transition to precedex gtt and prn fentanyl with scheduled oxycodone.  However, pt did not tolerate propofol and fentanyl gtts restarted                ?5/1: MRI Brain and EEG concerning for anoxic injury ?5/2: Persistent rhythmic fasciculations of right mouth/lip concerning for myoclonus vs seizures.  Neurology recommends transfer to [facility].   ? ? ?Significant Diagnostic Tests:  ?4/28: Cardiac Cath>>Prox Cx to Mid Cx lesion is 90% stenosed ?Proximal to previously placed stent A drug-eluting stent was ?successfully placed (overlapping previously placed stent) using a STENT ONYX FRONTIER 2.25X26.  Postdilated to 2.6 mm Post  intervention, there is a 0% residual stenosis. Previously placed Mid Cx to Dist Cx stent (drug-eluting stent) is  widely patent. Mid LAD lesion is 35% stenosed. 1st Diag lesion is 55% stenosed.  2nd Diag lesion is 70% stenosed. Previously placed Prox RCA stent (drug- ?eluting stent) is  widely patent. Mid RCA to Dist RCA lesion is 35% stenosed. ?4/28: MRI Brain>>Punctate acute/subacute infarct in the right cerebellar hemisphere. Trace intraventricular hemorrhage in the occipital horn of the right lateral ventricle. No evidence of global anoxic injury. Repeat study within 2-4 days may be obtained to exclude delayed injury. ?4/29: Echo>>EF estimated 60 to 65%  ?4/29: EEG>>Initial burst suppression pattern consistent with global cerebral dysfunction, medication effect, or both. After propofol was turned off, patient developed generalized periodic discharges at 1 Hz over a background of diffuse suppression timelocked to full body myoclonus, consistent with myoclonic status epilepticus. ? 5/4: EEG shows evidence of epileptogenicity with generalized onset and high risk for seizure recurrence. Another seizure documented this AM, confirmed on EEG showing generalized epileptiform bursts consistent with myoclonic seizures.  EEG pattern is usually suggestive of significant anoxic/hypoxic brain injury with poor neurologic recovery. ? ?Interim History / Subjective:  ?Patient had another seizure this morning, confirmed on EEG. Remains on full vent support and sedated.  ? ?Objective   ?Blood pressure (!) 118/53, pulse (!) 57, temperature 98 ?F (36.7 ?C), temperature source Axillary, resp. rate 20, weight 118.3 kg, SpO2 96 %. ?CVP:  [11 mmHg-18 mmHg] 18 mmHg  ?Vent Mode: PRVC ?FiO2 (%):  [30 %] 30 % ?Set Rate:  [  20 bmp] 20 bmp ?Vt Set:  [520 mL-590 mL] 590 mL ?PEEP:  [5 cmH20] 5 cmH20 ?Plateau Pressure:  [16 cmH20-19 cmH20] 16 cmH20  ? ?Intake/Output Summary (Last 24 hours) at 04/13/2022 1829 ?Last data filed at 04/13/2022 9371 ?Gross  per 24 hour  ?Intake 2333.85 ml  ?Output 4800 ml  ?Net -2466.15 ml  ? ?Filed Weights  ? 04/12/22 0500  ?Weight: 118.3 kg  ? ? ?Examination: ?General: intubated, sedated, more frequent twitching observed  ?HENT: Corneal and pupillary reflexes intact, tracheal cough reflex intact ?Lungs: Mechanically intubated, breathing unlabored, clear to auscultation bilaterally ?Cardiovascular: regular rate, systolic murmur, no rubs or gallops ?Abdomen: Soft, bowel sounds present ?Extremities: No lower extremity edema ?Neuro: Sedated, withdraws to painful stimuli of lower extremities, no response to painful stimuli of upper extremities, myoclonic activity observed  ?Skin: Warm, dry ? ?Resolved Hospital Problem list   ?Myoclonic status epilepticus  ? ?Assessment & Plan:  ? ?Ventilator dependent respiratory failure 2/2 cardiac arrest due to STEMI ?Anoxic brain injury ?Seizures ?Trace hemorrhage of right occipital horn ?Small evolving right cerebellar infarct  ?Possible aspiration pneumonia ?Patient presented s/p cardiac arrest found to have a lateral STEMI. He sustained an anoxic brain injury and has remained intubated on sedation. Attempted to wean from propofol yesterday but patient subsequently seized, started on VPA in addition to keppra.  Overnight Propofol and VPA increased.  This morning patient sustained another seizure, EEG showing generalized epileptiform bursts consistent with myoclonic seizures.  EEG pattern is usually suggestive of significant anoxic/hypoxic brain injury with poor neurologic recovery. Overall poor prognosis, will readdress goals of care today with family.  ?-Neurology following, appreciate assistance  ?-On Keppra and VPA ?-Wean fentanyl as able ?-Switch propofol to versed pushes as triglycerides are increasing  ?-LTM EEG  ?-VAP protocol ?-Continue full ventilatory support  ?-Zoysn for aspiration pneumonia ? ?PEA arrest ?Lateral STEMI ?PAF ?Patient presented s/p cardiac arrest due to STEMI. After ROSC  achieved was taken to the cath lab s/p LHC with stent placement to LCx on 4/28.  ?-Cardiology consulted, appreciate assitance  ?-Continue plavix, ASA and statin ?-Home hypertensive's held  ? ?AKI ?Cr 1.69<1.39, worsening today. ?-Avoid nephrotoxic agents  ? ?DM ?Blood sugars remain significantly elevated, start insulin gtt. ? ?Normocytic Anemia ?Hgb stable. ?-Hold xarelto ?-Trend CBC ? ? ?Best practice (evaluated daily)  ?Diet: tube feeds ?Pain/Anxiety/Delirium protocol (if indicated): versed, fentanyl ?VAP protocol (if indicated): VAP bundle ?DVT prophylaxis: LMWH ?GI prophylaxis: PPI ?Glucose control: SSI ?Mobility: bedrest ?Disposition:ICU ? ?Goals of Care:  ?Last date of multidisciplinary goals of care discussion:5/4 ?Family and staff present: yes ?Summary of discussion: goals of care discussion to be had today with family ?Follow up goals of care discussion due: 5/5 ?Code Status: Full ? ?Labs   ?CBC: ?Recent Labs  ?Lab 04/06/22 ?2359 04/07/22 ?0423 04/09/22 ?0431 04/09/22 ?1830 04/10/22 ?6967 04/22/2022 ?0429 04/12/22 ?8938 04/13/22 ?0436  ?WBC 12.7*   < > 9.1  --  8.2 6.2 7.8 7.0  ?NEUTROABS 6.5  --  6.9  --  5.7 3.9  --  4.5  ?HGB 12.1*   < > 7.7* 8.7* 8.4* 8.1* 8.8* 8.6*  ?HCT 38.8*   < > 24.2* 27.2* 26.2* 27.2* 27.8* 26.5*  ?MCV 87.4   < > 85.8  --  85.9 89.5 89.1 88.3  ?PLT 197   < > 150  --  172 178 210 227  ? < > = values in this interval not displayed.  ? ? ?Basic Metabolic Panel: ?Recent  Labs  ?Lab 04/09/22 ?0431 04/09/22 ?1830 04/10/22 ?2992 04/10/22 ?1621 04/19/2022 ?0429 04/12/22 ?4268 04/12/22 ?1752 04/13/22 ?0436  ?NA 140  --  144  --  144 147*  --  144  ?K 3.7  --  3.4*  --  3.4* 3.7  --  3.6  ?CL 112*  --  114*  --  112* 117*  --  113*  ?CO2 23  --  23  --  22 20*  --  21*  ?GLUCOSE 184*  --  170*  --  186* 184*  --  362*  ?BUN 32*  --  28*  --  33* 29*  --  32*  ?CREATININE 1.83*  --  1.53*  --  1.42* 1.39*  --  1.69*  ?CALCIUM 7.7*  --  7.5*  --  7.4* 8.2*  --  8.1*  ?MG 2.1   < > 2.2 2.3 2.1 2.2  2.2 2.1  ?PHOS 2.4*   < > 2.4* 3.1 3.4 3.0 2.9 3.1  ? < > = values in this interval not displayed.  ? ?GFR: ?Estimated Creatinine Clearance: 54.8 mL/min (A) (by C-G formula based on SCr of 1.69 mg/dL (H)). ?Recent

## 2022-04-13 NOTE — Progress Notes (Signed)
Pt cpt on hold due to pt actively having a seizure. RN at bedside, RT will continue to montior.  ?

## 2022-04-13 NOTE — Progress Notes (Signed)
?   04/13/22 0730  ?Neurological  ?Neuro Symptoms Seizure  ?Neuro Additional Assessments Seizure activity  ?Seizure Activity  ?Psychomotor Symptoms None  ?Motor Component Left;Face;Eye;Twitching;Foot  ?Duration 23 min  ? ?RN entered room [phone], untangling cords when noticed left mouth twitching began.  After aprroximately 5 seconds, left eye twitching began. Within another 10 seconds left shoulder and bilateral eyes as well as left foot began twitching.  Page sent to Mike Craze MD x2.  CCM on floor, to bedside, ativan ordered as seizure ending.   ?

## 2022-04-14 DIAGNOSIS — I469 Cardiac arrest, cause unspecified: Secondary | ICD-10-CM | POA: Diagnosis not present

## 2022-04-14 MED ORDER — LORAZEPAM 2 MG/ML IJ SOLN
2.0000 mg | INTRAMUSCULAR | Status: DC | PRN
  Administered 2022-04-14 – 2022-04-15 (×3): 2 mg via INTRAVENOUS
  Filled 2022-04-14 (×4): qty 1

## 2022-04-14 MED ORDER — HALOPERIDOL LACTATE 5 MG/ML IJ SOLN: 2.5000 mg | INTRAMUSCULAR | Status: DC | PRN

## 2022-04-14 MED ORDER — MORPHINE SULFATE (PF) 2 MG/ML IV SOLN
2.0000 mg | INTRAVENOUS | Status: DC | PRN
  Administered 2022-04-14 (×3): 2 mg via INTRAVENOUS
  Administered 2022-04-15 – 2022-04-16 (×6): 4 mg via INTRAVENOUS
  Filled 2022-04-14 (×3): qty 2
  Filled 2022-04-14 (×2): qty 1
  Filled 2022-04-14 (×3): qty 2
  Filled 2022-04-14: qty 1

## 2022-04-14 MED ORDER — PROPOFOL 1000 MG/100ML IV EMUL
20.0000 ug/kg/min | INTRAVENOUS | Status: DC
  Administered 2022-04-14: 10 ug/kg/min via INTRAVENOUS
  Administered 2022-04-14: 5 ug/kg/min via INTRAVENOUS
  Administered 2022-04-15: 20 ug/kg/min via INTRAVENOUS
  Administered 2022-04-15: 10 ug/kg/min via INTRAVENOUS
  Administered 2022-04-15 – 2022-04-16 (×3): 20 ug/kg/min via INTRAVENOUS
  Filled 2022-04-14 (×7): qty 100

## 2022-04-14 NOTE — Progress Notes (Signed)
Noted change to comfort care, neurology is available for any question or conversations as needed. ? ?Roland Rack, MD ?[facility] ?[phone] ? ?If 7pm- 7am, please page neurology on call as listed in [place]. ? ?

## 2022-04-14 NOTE — Progress Notes (Addendum)
? ?NAME:  Jason Neal, MRN:  [id number], DOB:  1951-04-21, LOS: 3 ?ADMISSION DATE:  04/13/2022, CONSULTATION DATE:  04/26/2022 ?REFERRING MD:  Dr. Patsey Berthold CHIEF COMPLAINT:  Anoxic brain injury  ? ?History of Present Illness:  ?71 year old male who has a history of coronary artery disease with a prior PCI to left circumflex and RCA he also has a past medical history of paroxysmal atrial fibrillation, diabetes mellitus, hypertension chronic kidney disease OSA on CPAP.  He presented to [facility] 4/28 with cardiac arrest was found to have a lateral STEMI for which he was taken to the cath lab and had stent placement. Since admit, MRI shows changes consistent with anoxic brain damage and after discussion with neurology, they recommended transferred to [facility] intensive care unit 5/2 for cEEG and further evaluation by neurological services. ?  ? ?Past Medical History:  ?CAD s/p PCI to left circumflex and RCA ?PAF ?DM ?HTN ?CKD ?OSA on CPAP  ? ?Significant Hospital Events:  ?4/28: Admitted to the ICU with acute ST elevation MI of the lateral wall s/p cath ?4/30: WUA performed myoclonus improved pt unable to follow commands; no response to BUE noxious stimuli; withdraws from pain BLE; Sedation restarted due to vent dyssynchrony, increased work of breathing and agitation/delirium.  Attempted to transition to precedex gtt and prn fentanyl with scheduled oxycodone.  However, pt did not tolerate propofol and fentanyl gtts restarted                ?5/1: MRI Brain and EEG concerning for anoxic injury ?5/2: Persistent rhythmic fasciculations of right mouth/lip concerning for myoclonus vs seizures.  Neurology recommends transfer to [facility].   ? ? ?Significant Diagnostic Tests:  ?4/28: Cardiac Cath>>Prox Cx to Mid Cx lesion is 90% stenosed ?Proximal to previously placed stent A drug-eluting stent was ?successfully placed (overlapping previously placed stent) using a STENT ONYX FRONTIER 2.25X26.  Postdilated to 2.6 mm Post  intervention, there is a 0% residual stenosis. Previously placed Mid Cx to Dist Cx stent (drug-eluting stent) is  widely patent. Mid LAD lesion is 35% stenosed. 1st Diag lesion is 55% stenosed.  2nd Diag lesion is 70% stenosed. Previously placed Prox RCA stent (drug- ?eluting stent) is  widely patent. Mid RCA to Dist RCA lesion is 35% stenosed. ?4/28: MRI Brain>>Punctate acute/subacute infarct in the right cerebellar hemisphere. Trace intraventricular hemorrhage in the occipital horn of the right lateral ventricle. No evidence of global anoxic injury. Repeat study within 2-4 days may be obtained to exclude delayed injury. ?4/29: Echo>>EF estimated 60 to 65%  ?4/29: EEG>>Initial burst suppression pattern consistent with global cerebral dysfunction, medication effect, or both. After propofol was turned off, patient developed generalized periodic discharges at 1 Hz over a background of diffuse suppression timelocked to full body myoclonus, consistent with myoclonic status epilepticus. ? 5/4: EEG shows evidence of epileptogenicity with generalized onset and high risk for seizure recurrence. Another seizure documented this AM, confirmed on EEG showing generalized epileptiform bursts consistent with myoclonic seizures.  EEG pattern is usually suggestive of significant anoxic/hypoxic brain injury with poor neurologic recovery. ?5/4 -- comfort care  ? ?5/5 transfer to palliative floor. Family is trying to discuss palli meds ? ?Interim History / Subjective:  ?Transitioned to comfort care overnight  ? ?Objective   ?Blood pressure (!) 106/56, pulse 94, temperature 98.5 ?F (36.9 ?C), resp. rate 19, weight 118.3 kg, SpO2 95 %. ?CVP:  [14 mmHg-25 mmHg] 22 mmHg  ?Vent Mode: PRVC ?FiO2 (%):  [30 %] 30 % ?  Set Rate:  [20 bmp] 20 bmp ?Vt Set:  [590 mL] 590 mL ?PEEP:  [5 cmH20] 5 cmH20 ?Plateau Pressure:  [16 cmH20-18 cmH20] 18 cmH20  ? ?Intake/Output Summary (Last 24 hours) at 04/14/2022 0756 ?Last data filed at 04/14/2022 0400 ?Gross per  24 hour  ?Intake 2232.16 ml  ?Output 2775 ml  ?Net -542.84 ml  ? ?Filed Weights  ? 04/12/22 0500  ?Weight: 118.3 kg  ? ? ?Examination: ?General: Critically ill elderly M NAD  ?HENT: NCAT  ?Lungs: even and unlabored on 4LNC  ?Cardiovascular:  rrr  ?Abdomen: soft ndnt  ?Extremities: no acute joint deformity  ?Neuro: sedated  ?Skin: warm clean ? ?Resolved Hospital Problem list   ? ? ?Assessment & Plan:  ? ?Anoxic brain injury, anoxic encephalopathy  ?Seizure ?SE  ?R occipital horn trace hemorrhage ?R cerebellar infarct ?Acute respiratory failure with hypoxia ?Aspiration PNA ?Normocytic anemia ?AKI  ?PEA arrest ?Lateral STEMI  ?pAF  ?DM2  ?Encounter for palliative care ?DNR ?P ?-transitioned to comfort care 5/4 evening ?-with Sz/ SE, orders to extubate on prop gtt however family declines this as well as continuous analgesia. At this time they are only accepting of BZD.  ?-will cont to gently educate/advocate for analgesia / dyspnea management with opiates. Will keep fent gtt available but will add PRN morphine in interim  ?-will dc prop gtt and add additional PRN BZD  ?-family to further discuss prop -- happy to add back if they decide to accept ?-when family is accepting, remove or shut off [facility]  ?-cont emotional support  ?-transfer to palli floor  ? ? ?Best practice (evaluated daily)  ?Diet: n/a ?Pain/Anxiety/Delirium protocol (if indicated): comfort care  ?VAP protocol (if indicated): n/a ?DVT prophylaxis: n/a ?GI prophylaxis:n/a ?Glucose control: n/a  ?Mobility: n/a  ?Disposition: Palliative  ?Code Status: DNR  ? ?Goals of Care:  ?Last date of multidisciplinary goals of care discussion:5/4 ?Family and staff present: yes ?Summary of discussion: goals of care discussion to be had today with family ?Follow up goals of care discussion due: 5/5 ?Code Status: Full ? ?Labs   ?CBC: ?Recent Labs  ?Lab 04/09/22 ?0431 04/09/22 ?1830 04/10/22 ?6283 04/28/2022 ?0429 04/12/22 ?6629 04/13/22 ?0436  ?WBC 9.1  --  8.2 6.2 7.8 7.0   ?NEUTROABS 6.9  --  5.7 3.9  --  4.5  ?HGB 7.7* 8.7* 8.4* 8.1* 8.8* 8.6*  ?HCT 24.2* 27.2* 26.2* 27.2* 27.8* 26.5*  ?MCV 85.8  --  85.9 89.5 89.1 88.3  ?PLT 150  --  172 178 210 227  ? ? ?Basic Metabolic Panel: ?Recent Labs  ?Lab 04/09/22 ?0431 04/09/22 ?1830 04/10/22 ?4765 04/10/22 ?1621 04/30/2022 ?0429 04/12/22 ?4650 04/12/22 ?1752 04/13/22 ?0436  ?NA 140  --  144  --  144 147*  --  144  ?K 3.7  --  3.4*  --  3.4* 3.7  --  3.6  ?CL 112*  --  114*  --  112* 117*  --  113*  ?CO2 23  --  23  --  22 20*  --  21*  ?GLUCOSE 184*  --  170*  --  186* 184*  --  362*  ?BUN 32*  --  28*  --  33* 29*  --  32*  ?CREATININE 1.83*  --  1.53*  --  1.42* 1.39*  --  1.69*  ?CALCIUM 7.7*  --  7.5*  --  7.4* 8.2*  --  8.1*  ?MG 2.1   < > 2.2 2.3 2.1  2.2 2.2 2.1  ?PHOS 2.4*   < > 2.4* 3.1 3.4 3.0 2.9 3.1  ? < > = values in this interval not displayed.  ? ?GFR: ?Estimated Creatinine Clearance: 54.8 mL/min (A) (by C-G formula based on SCr of 1.69 mg/dL (H)). ?Recent Labs  ?Lab 04/07/22 ?1100 04/08/22 ?0434 04/10/22 ?3810 04/15/2022 ?0429 04/12/22 ?1751 04/13/22 ?0436  ?WBC  --    < > 8.2 6.2 7.8 7.0  ?LATICACIDVEN 2.2*  --   --   --   --   --   ? < > = values in this interval not displayed.  ? ? ?Liver Function Tests: ?Recent Labs  ?Lab 04/07/22 ?1102 04/08/22 ?0434 04/09/22 ?0431 04/10/22 ?0258 04/13/22 ?0436  ?AST 362* 225* 115* 80* 42*  ?ALT 189* 137* 85* 57* 29  ?ALKPHOS 41 35* 30* 28* 29*  ?BILITOT 0.5 0.5 0.7 0.6 0.6  ?PROT 6.2* 6.3* 6.1* 5.9* 5.6*  ?ALBUMIN 3.3* 3.2* 2.9* 2.6* 2.3*  ? ?No results for input(s): LIPASE, AMYLASE in the last 168 hours. ?No results for input(s): AMMONIA in the last 168 hours. ? ?ABG ?   ?Component Value Date/Time  ? PHART 7.31 (L) 04/07/2022 0423  ? PCO2ART 40 04/07/2022 0423  ? PO2ART 161 (H) 04/07/2022 0423  ? HCO3 20.1 04/07/2022 0423  ? TCO2 23 08/22/2018 1239  ? ACIDBASEDEF 5.8 (H) 04/07/2022 0423  ? O2SAT 99.9 04/07/2022 0423  ?  ? ?Coagulation Profile: ?No results for input(s): INR, PROTIME in  the last 168 hours. ? ? ?Cardiac Enzymes: ?No results for input(s): CKTOTAL, CKMB, CKMBINDEX, TROPONINI in the last 168 hours. ? ?HbA1C: ?Hgb A1c MFr Bld  ?Date/Time Value Ref Range Status  ?04/06/2022 11:59 PM 7.6 (H)

## 2022-04-14 NOTE — Progress Notes (Signed)
Nutrition Brief Note ? ?Chart reviewed. Pt now transitioned to comfort care. No further nutrition interventions planned at this time.  ?Please re-consult as needed.  ? ?Lockie Pares., RD, LDN, CNSC ?See AMiON for contact information  ? ? ?

## 2022-04-15 DIAGNOSIS — G40901 Epilepsy, unspecified, not intractable, with status epilepticus: Secondary | ICD-10-CM

## 2022-04-15 DIAGNOSIS — Z515 Encounter for palliative care: Principal | ICD-10-CM

## 2022-04-15 LAB — CULTURE, RESPIRATORY W GRAM STAIN

## 2022-04-15 MED ORDER — LIP MEDEX EX OINT
TOPICAL_OINTMENT | CUTANEOUS | Status: DC | PRN
  Administered 2022-04-15: 1 via TOPICAL
  Filled 2022-04-15: qty 7

## 2022-04-15 NOTE — Assessment & Plan Note (Signed)
Presented to [facility] 4/28 with cardiac arrest, found to have lateral stemi, s/p cath lab and stent placement ?

## 2022-04-15 NOTE — Progress Notes (Signed)
Versed gtt increased to 10 per order. ?

## 2022-04-15 NOTE — Assessment & Plan Note (Signed)
Appreciate neurology recommendations, patient with post anoxic status epilpeticus ?Poor prognosis based on GPD's on EEG, early myoclonic status, and lack of improvement over course, as well as evidence of anoxic injury on MRI ?He's been transitioned to comfort measures as of 5/4 per discuss with Dr. Carlis Abbott ?

## 2022-04-15 NOTE — Progress Notes (Signed)
Confirmed propofol and versed use in this palliative setting with provider Shirlee Limerick NP. ?

## 2022-04-15 NOTE — Hospital Course (Addendum)
71 year old male who has Maggie Senseney history of coronary artery disease with Wanette Robison prior PCI to left circumflex and RCA he also has Aishah Teffeteller past medical history of paroxysmal atrial fibrillation, diabetes mellitus, hypertension chronic kidney disease OSA on CPAP.  He presented to [facility] 4/28 with cardiac arrest was found to have Emelie Newsom lateral STEMI for which he was taken to the cath lab and had stent placement. Since admit, MRI shows changes consistent with anoxic brain damage and after discussion with neurology, they recommended transferred to [facility] intensive care unit 5/2 for cEEG and further evaluation by neurological services.  On 5/4 neurology felt he had poor prognosis based on GPD's on EEG, early myoclonic status, lack of improvement over the course of the past 5 days, evidence of anoxic injury on MRI.  Aggressive suppression was thought unlikely to give meaningful benefit.  After discussion with Dr. Carlis Abbott on 5/4, plan was made for comfort measures, extubated 5/4.   ? ?Significant events ?4/28: Cardiac Cath>>Prox Cx to Mid Cx lesion is 90% stenosed ?Proximal to previously placed stent Helton Oleson drug-eluting stent was ?successfully placed (overlapping previously placed stent) using Brigit Doke STENT ONYX FRONTIER 2.25X26.  Postdilated to 2.6 mm Post intervention, there is Mysha Peeler 0% residual stenosis. Previously placed Mid Cx to Dist Cx stent (drug-eluting stent) is  widely patent. Mid LAD lesion is 35% stenosed. 1st Diag lesion is 55% stenosed.  2nd Diag lesion is 70% stenosed. Previously placed Prox RCA stent (drug- ?eluting stent) is  widely patent. Mid RCA to Dist RCA lesion is 35% stenosed. ?4/28: MRI Brain>>Punctate acute/subacute infarct in the right cerebellar hemisphere. Trace intraventricular hemorrhage in the occipital horn of the right lateral ventricle. No evidence of global anoxic injury. Repeat study within 2-4 days may be obtained to exclude delayed injury. ?4/29: Echo>>EF estimated 60 to 65%  ?4/29: EEG>>Initial burst suppression pattern  consistent with global cerebral dysfunction, medication effect, or both. After propofol was turned off, patient developed generalized periodic discharges at 1 Hz over Neria Procter background of diffuse suppression timelocked to full body myoclonus, consistent with myoclonic status epilepticus. ? 5/4: EEG shows evidence of epileptogenicity with generalized onset and high risk for seizure recurrence. Another seizure documented this AM, confirmed on EEG showing generalized epileptiform bursts consistent with myoclonic seizures.  EEG pattern is usually suggestive of significant anoxic/hypoxic brain injury with poor neurologic recovery. ?5/4 -- comfort care  ?2023/04/27 -- started on morphine gtt.  Passed away at 12:33 PM on 04-26-22 with family at bedside. ? ?See below and prior notes for additional details ?

## 2022-04-15 NOTE — Progress Notes (Signed)
?PROGRESS NOTE ? ? ? ?Jason Neal  GQQ:[id number] DOB: 1951/11/23 DOA: 04/30/2022 ?PCP: Kirk Ruths, MD  ?No chief complaint on file. ? ? ?Brief Narrative:  ?71 year old male who has Caelan Branden history of coronary artery disease with Kayte Borchard prior PCI to left circumflex and RCA he also has Arelis Neumeier past medical history of paroxysmal atrial fibrillation, diabetes mellitus, hypertension chronic kidney disease OSA on CPAP.  He presented to [facility] 4/28 with cardiac arrest was found to have Chrisy Hillebrand lateral STEMI for which he was taken to the cath lab and had stent placement. Since admit, MRI shows changes consistent with anoxic brain damage and after discussion with neurology, they recommended transferred to [facility] intensive care unit 5/2 for cEEG and further evaluation by neurological services.  On 5/4 neurology felt he had poor prognosis based on GPD's on EEG, early myoclonic status, lack of improvement over the course of the past 5 days, evidence of anoxic injury on MRI.  Aggressive suppression was thought unlikely to give meaningful benefit.  After discussion with Dr. Carlis Abbott on 5/4, plan was made for comfort measures, extubated 5/4.   ? ?Significant events ?4/28: Cardiac Cath>>Prox Cx to Mid Cx lesion is 90% stenosed ?Proximal to previously placed stent Cerys Winget drug-eluting stent was ?successfully placed (overlapping previously placed stent) using Lysle Yero STENT ONYX FRONTIER 2.25X26.  Postdilated to 2.6 mm Post intervention, there is Yarel Rushlow 0% residual stenosis. Previously placed Mid Cx to Dist Cx stent (drug-eluting stent) is  widely patent. Mid LAD lesion is 35% stenosed. 1st Diag lesion is 55% stenosed.  2nd Diag lesion is 70% stenosed. Previously placed Prox RCA stent (drug- ?eluting stent) is  widely patent. Mid RCA to Dist RCA lesion is 35% stenosed. ?4/28: MRI Brain>>Punctate acute/subacute infarct in the right cerebellar hemisphere. Trace intraventricular hemorrhage in the occipital horn of the right lateral ventricle. No evidence of global  anoxic injury. Repeat study within 2-4 days may be obtained to exclude delayed injury. ?4/29: Echo>>EF estimated 60 to 65%  ?4/29: EEG>>Initial burst suppression pattern consistent with global cerebral dysfunction, medication effect, or both. After propofol was turned off, patient developed generalized periodic discharges at 1 Hz over Alson Mcpheeters background of diffuse suppression timelocked to full body myoclonus, consistent with myoclonic status epilepticus. ? 5/4: EEG shows evidence of epileptogenicity with generalized onset and high risk for seizure recurrence. Another seizure documented this AM, confirmed on EEG showing generalized epileptiform bursts consistent with myoclonic seizures.  EEG pattern is usually suggestive of significant anoxic/hypoxic brain injury with poor neurologic recovery. ?5/4 -- comfort care  ?   ? ? ?Assessment & Plan: ?  ?Principal Problem: ?  Comfort measures only status ?Active Problems: ?  Cardiac arrest [facility]) ?  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  AKI (acute kidney injury) ([place]) ?  Encephalopathy acute ?  Status epilepticus ([place]) ? ? ?Assessment and Plan: ?* Comfort measures only status ?Appreciate PCCM team who had goals of care conversation with family, 5/4 Dr. Carlis Abbott had conversation with plan to withdrawal mechanical ventilation and aggressive support.  At this time, we're continuing propofol as he seized off this twice.   ?Continue propofol gtt ?Continue midazolam gtt for comfort ?We have prn morphine for pain, resp distress, tachypnea, air hunger, etc.  Appeared comfortable today, will use this as needed.  Discussed potential/expectation for need to eventually schedule this or start gtt pending course. ?Liberalize visitors ?Due to need for propofol gtt, remain in 4n16 ? ?Cardiac arrest [facility]) ?Presented to [facility] 4/28 with cardiac arrest,  found to have lateral stemi, s/p cath lab and stent placement ? 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 Carlis Abbott ? ? ?DVT prophylaxis: comfort ?Code Status: dnr ?Family Communication: wife, son, sister, and multiple other family members at bedside ?Disposition:  ? ?Status is: Inpatient ?Remains inpatient appropriate because: comfort measures, expect in hospital death ?  ?Consultants:  ?Neurology ?PCCM ?cardiology ? ?Procedures:  ?Cath ?  CULPRIT LESION: Prox Cx to Mid Cx lesion is 90% stenosed.  Proximal to previously placed stent ?  Sendy Pluta drug-eluting stent was successfully placed (overlapping previously placed stent) using Sharonann Malbrough STENT ONYX FRONTIER 2.25X26.  Postdilated to 2.6 mm ?  Post intervention, there is Waver Dibiasio 0% residual stenosis. ?  Previously placed Mid Cx to Dist Cx stent (drug-eluting stent) is  widely patent. ?  ---------------------------------------------------- ?  Mid LAD lesion is 35% stenosed. ?  1st Diag lesion is 55% stenosed.  2nd Diag lesion is 70% stenosed. ?  Previously placed Prox RCA stent (drug-eluting stent) is  widely patent. ?  Mid RCA to Dist RCA lesion is 35% stenosed. ?  ?SUMMARY ?3-4-vessel CAD with Culprit Lesion Being 85 to 90% mid LCx stenosis just prior to previously placed stent; widely patent RCA stent with mild diffuse disease distally, diffuse 50 to 65% stenosis in large 1st Diag and focal 70% lesion in small 2nd Diag (not PCI target) ?Successful DES PCI of mid LCx lesion reducing from 85-90% to 0% with Carmella Kees Onyx Frontier DES 2.25 mm x 22 mm postdilated to 2.6 mm (overlaps previous stent); TIMI-3 flow pre and post ?Unable to cross aortic valve with multiple different catheters.  He has known mild aortic stenosis but also tortuous aortic root.  Very difficult to engage. => We will need echocardiogram ?  ?RECOMMENDATIONS ?PCCM to admit to ICU for vent management.  ABG  in the Cath Lab was relatively normal with pH of 7.35 and adequate PaO2/PCO2. ?On Kengreal infusion upon leaving the Cath Lab.  Will need OG tube placed to give Plavix and then discontinue Kengreal. ?Gentle hydration for contrast =-> more than anticipated due to difficulty engaging RCA. ?Hold ARB until renal function stable ?Check 2D echo ?Restart beta-blocker and hydralazine, and increase atorvastatin 80 mg ?Monitor for neurologic recovery ?  ?  ?Glenetta Hew, MD ? ?Echo ?IMPRESSIONS  ? ? ? 1. Left ventricular ejection fraction, by estimation, is 60 to 65%. The  ?left ventricle has normal function. The left ventricle has no regional  ?wall motion abnormalities. Left ventricular diastolic parameters are  ?indeterminate.  ? 2. Right ventricular systolic function is normal. The right ventricular  ?size is normal.  ? 3. The mitral valve was not well visualized. No evidence of mitral valve  ?regurgitation.  ? 4. The aortic valve was not well visualized. Aortic valve regurgitation  ?is not visualized.  ? ?EEG ?Impression and clinical correlation: This EEG was obtained while comatose on fentanyl, and initially on propofol, and is abnormal due to: ?- Initial burst suppression pattern consistent with global cerebral dysfunction, medication effect, or both. ?- After propofol was turned off, patient developed generalized periodic discharges at 1 Hz over Rigdon Macomber background of diffuse suppression timelocked to full body myoclonus, consistent with myoclonic status epilepticus. ? ?EEG ?IMPRESSION: ?This  study initially showed myoclonic status epilepticus.  As medications were adjusted, EEG continued to show evidence of epileptogenicity with generalized onset and high risk for seizure recurrence.  Additionally there is profound diffuse encephalopathy likely due to sedation, anoxic/hypoxic brain injury. ? ?Antimicrobials:  ?Anti-infectives (From admission, onward)  ? ? Start     Dose/Rate Route Frequency Ordered Stop  ? 04/12/22 2300   piperacillin-tazobactam (ZOSYN) IVPB 3.375 g  Status:  Discontinued       ? 3.375 g ?12.5 mL/hr over 240 Minutes Intravenous Every 8 hours 04/12/22 1613 04/14/22 0659  ? 04/12/22 1700  piperacillin-tazo

## 2022-04-15 NOTE — Assessment & Plan Note (Signed)
Continue keppra, depacon, versed, propofol ?Comfort measures ?

## 2022-04-15 NOTE — Assessment & Plan Note (Addendum)
Appreciate PCCM team who had goals of care conversation with family, 5/4 Dr. Carlis Abbott had conversation with plan to withdrawal mechanical ventilation and aggressive support.  At this time, we're continuing propofol as he seized off this twice.   ?Continue propofol gtt ?Continue midazolam gtt for comfort ?Morphine gtt started today ?Liberalize visitors ?Due to need for propofol gtt, remain in 4n16 ?

## 2022-04-16 DIAGNOSIS — G40409 Other generalized epilepsy and epileptic syndromes, not intractable, without status epilepticus: Secondary | ICD-10-CM

## 2022-04-16 DIAGNOSIS — G4733 Obstructive sleep apnea (adult) (pediatric): Secondary | ICD-10-CM

## 2022-04-16 DIAGNOSIS — I480 Paroxysmal atrial fibrillation: Secondary | ICD-10-CM

## 2022-04-16 MED ORDER — MORPHINE 100MG IN NS 100ML (1MG/ML) PREMIX INFUSION
INTRAVENOUS | Status: AC
  Filled 2022-04-16: qty 100

## 2022-04-16 MED ORDER — MORPHINE 100MG IN NS 100ML (1MG/ML) PREMIX INFUSION
1.0000 mg/h | INTRAVENOUS | Status: DC
  Administered 2022-04-16: 1 mg/h via INTRAVENOUS

## 2022-04-16 MED ORDER — MORPHINE BOLUS VIA INFUSION
2.0000 mg | INTRAVENOUS | Status: DC | PRN
  Filled 2022-04-16: qty 2

## 2022-05-11 NOTE — Death Summary Note (Addendum)
? ?DEATH SUMMARY  ? ?Patient Details  ?Name: Jason Neal ?MRN: [id number] ?DOB: May 24, 1951 ?[id number], Jason Cornfield, MD ?Admission/Discharge Information  ? ?Admit Date:  04-25-2022  ?Date of Death: Date of Death: 04/30/22  ?Time of Death: Time of Death: 03-08-32  ?Length of Stay: 5  ? ?Principle Cause of death: stemi, cardiac arrest, anoxic brain injury ? ?Hospital Diagnoses: ?Principal Problem: ?  Comfort measures only status ?Active Problems: ?  CAD S/P percutaneous coronary angioplasty ?  Cardiac arrest [facility]) ?  Anoxic brain injury ([place]) ?  Status epilepticus ([place]) ?  Chronic renal insufficiency, stage III (moderate)- he sees Jason Neal nephrologist in [place] ?  Uncontrolled type 2 diabetes mellitus with hyperglycemia, without long-term current use of insulin ([place]) ?  STEMI (ST elevation myocardial infarction) ([place]) ?  AKI (acute kidney injury) ([place]) ?  Encephalopathy acute ?  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  Paroxysmal atrial fibrillation ([facility]) ?  OSA on CPAP ? ? ?Hospital Course: ?71 year old male who has Jason Neal history of coronary artery disease with Jason Neal prior PCI to left circumflex and RCA he also has Jason Neal past medical history of paroxysmal atrial fibrillation, diabetes mellitus, hypertension chronic kidney disease OSA on CPAP.  He presented to [facility] 4/28 with cardiac arrest was found to have Jason Neal lateral STEMI for which he was taken to the cath lab and had stent placement. Since admit, MRI shows changes consistent with anoxic brain damage and after discussion with neurology, they recommended transferred to [facility] intensive care unit 5/2 for cEEG and further evaluation by neurological services.  On 5/4 neurology felt he had poor prognosis based on GPD's on EEG, early myoclonic status, lack of improvement over the course of the past 5 days, evidence of anoxic injury on MRI.  Aggressive suppression was thought unlikely to give meaningful benefit.  After discussion with Dr. Carlis Neal on 5/4, plan was made for comfort measures, extubated  5/4.   ? ?Significant events ?4/28: Cardiac Cath>>Prox Cx to Mid Cx lesion is 90% stenosed ?Proximal to previously placed stent Jason Neal drug-eluting stent was ?successfully placed (overlapping previously placed stent) using Jason Neal STENT ONYX FRONTIER 2.25X26.  Postdilated to 2.6 mm Post intervention, there is Jason Neal 0% residual stenosis. Previously placed Mid Cx to Dist Cx stent (drug-eluting stent) is  widely patent. Mid LAD lesion is 35% stenosed. 1st Diag lesion is 55% stenosed.  2nd Diag lesion is 70% stenosed. Previously placed Prox RCA stent (drug- ?eluting stent) is  widely patent. Mid RCA to Dist RCA lesion is 35% stenosed. ?4/28: MRI Brain>>Punctate acute/subacute infarct in the right cerebellar hemisphere. Trace intraventricular hemorrhage in the occipital horn of the right lateral ventricle. No evidence of global anoxic injury. Repeat study within 2-4 days may be obtained to exclude delayed injury. ?4/29: Echo>>EF estimated 60 to 65%  ?4/29: EEG>>Initial burst suppression pattern consistent with global cerebral dysfunction, medication effect, or both. After propofol was turned off, patient developed generalized periodic discharges at 1 Hz over Jason Neal background of diffuse suppression timelocked to full body myoclonus, consistent with myoclonic status epilepticus. ? 5/4: EEG shows evidence of epileptogenicity with generalized onset and high risk for seizure recurrence. Another seizure documented this AM, confirmed on EEG showing generalized epileptiform bursts consistent with myoclonic seizures.  EEG pattern is usually suggestive of significant anoxic/hypoxic brain injury with poor neurologic recovery. ?5/4 -- comfort care  ?01-May-2023 -- started on morphine gtt.  Passed away at 12:33 PM on Apr 30, 2022 with family at bedside. ? ?See below and prior notes for additional  details ? ?Assessment and Plan: ?* Comfort measures only status ?Appreciate PCCM team who had goals of care conversation with family, 5/4 Dr. Carlis Neal had conversation with  plan to withdrawal mechanical ventilation and aggressive support.  At this time, we're continuing propofol as he seized off this twice.   ?Continue propofol gtt ?Continue midazolam gtt for comfort ?Morphine gtt started today ?Liberalize visitors ?Due to need for propofol gtt, remain in 4n16 ? ?Cardiac arrest [facility]) ?Presented to [facility] 4/28 with cardiac arrest, found to have lateral stemi, s/p cath lab and stent placement ? 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 Carlis Neal ? ?Status epilepticus ([place]) ?Continue keppra, depacon, versed, propofol ?Comfort measures ? ? ? ? ?  ? ?Procedures: see prior notes ? ?Consultations: PCCM, neurology, cardiology ? ?The results of significant diagnostics from this hospitalization (including imaging, microbiology, ancillary and laboratory) are listed below for reference.  ? ?Significant Diagnostic Studies: ?DG Abd 1 View ? ?Result Date: 04/07/2022 ?CLINICAL DATA:  NG tube placement. EXAM: ABDOMEN - 1 VIEW COMPARISON:  Earlier same day FINDINGS: 0522 hours. NG tube tip is in the distal stomach. Nonspecific bowel gas pattern. IMPRESSION: NG tube tip is in the distal stomach. Electronically Signed   By: Misty Stanley M.D.   On: 04/07/2022 05:49  ? ?DG Abd 1 View ? ?Result Date: 04/07/2022 ?CLINICAL DATA:  71 year old male central line and enteric tube placement. EXAM: ABDOMEN - 1 VIEW COMPARISON:  Portable chest at the same time. Prior abdominal film 05/24/2006. FINDINGS: Portable AP semi upright view at 0418 hours. Enteric tube tip courses to the GEJ, but the tube appears to be proximal to the stomach which is moderately distended with gas. Gas-filled but nondilated small bowel throughout the visible lower  abdomen. No acute osseous abnormality identified. IMPRESSION: 1. Enteric tube tip at the GEJ. Advance 8 cm to ensure side hole placement within the stomach. 2. Moderately distended stomach with gas. Gas-filled small bowel throughout the abdomen. Electronically Signed   By: Genevie Ann M.D.   On: 04/07/2022 04:49  ? ?MR BRAIN WO CONTRAST ? ?Result Date: 04/10/2022 ?CLINICAL DATA:  Mental status change, unknown cause Concern for anoxic brain injury. EXAM: MRI HEAD WITHOUT CONTRAST TECHNIQUE: Multiplanar, multiecho pulse sequences of the brain and surrounding structures were obtained without intravenous contrast. COMPARISON:  MRI April 07, 2022. FINDINGS: Brain: Mild restricted diffusion involving bilateral caudates and the bilateral parieto-occipital cortex. Evolving small right cerebellar infarct. There may be slight edema. No significant mass effect. Additional scattered T2/FLAIR hyperintensities in the white matter, nonspecific but compatible with chronic microvascular ischemic disease. Redemonstrated trace intraventricular hemorrhage in the right occipital horn. Similar linear susceptibility artifact along the right temporal convexity, which could represent trace subarachnoid hemorrhage versus thrombus vessel. No hydrocephalus. No midline shift. Vascular: Major arterial flow voids are maintained skull base. Skull and upper cervical spine: Normal marrow signal. Sinuses/Orbits: Paranasal sinus mucosal thickening. No acute orbital findings Other: Bilateral mastoid effusions. IMPRESSION: 1. Mild restricted diffusion involving bilateral caudate and parieto-occipital cortex, concerning for hypoxic/ischemic insult given the clinical history. No significant mass effect. 2. Evolving small right cerebellar infarct. 3. Similar trace intraventricular hemorrhage in the right occipital horn. These results will be called to the ordering clinician or representative by  the Psychologist, clinical, and communication documented in the PACS  or [employer]. Electronically Signed   By: Margaretha Sheffield M.D.   On: 04/10/2022 15:00  ? ?MR BRAIN WO CONTRAST ? ?Result Date: 04/07/2022 ?CLINICAL DATA:  Anoxic brain damage. EXAM: MRI HEAD WITHOU

## 2022-05-11 NOTE — Progress Notes (Signed)
Pt died at 14. Death verified with Marlou Porch RN. MD notified. ?

## 2022-05-11 DEATH — deceased

## 2022-05-15 NOTE — ED Notes (Signed)
Rectal ASA administered per cardiologist order.
